# Patient Record
Sex: Male | Born: 1935 | Race: White | Hispanic: No | Marital: Married | Smoking: Former smoker
Health system: Southern US, Community
[De-identification: ages and names within clinical notes are randomized; demographics above are authoritative.]

## PROBLEM LIST (undated history)

## (undated) DIAGNOSIS — A0472 Enterocolitis due to Clostridium difficile, not specified as recurrent: Secondary | ICD-10-CM

## (undated) DIAGNOSIS — Z8619 Personal history of other infectious and parasitic diseases: Secondary | ICD-10-CM

## (undated) DIAGNOSIS — I6529 Occlusion and stenosis of unspecified carotid artery: Secondary | ICD-10-CM

## (undated) DIAGNOSIS — I219 Acute myocardial infarction, unspecified: Secondary | ICD-10-CM

## (undated) DIAGNOSIS — I714 Abdominal aortic aneurysm, without rupture, unspecified: Secondary | ICD-10-CM

## (undated) DIAGNOSIS — I4891 Unspecified atrial fibrillation: Secondary | ICD-10-CM

## (undated) DIAGNOSIS — I10 Essential (primary) hypertension: Secondary | ICD-10-CM

## (undated) DIAGNOSIS — E785 Hyperlipidemia, unspecified: Secondary | ICD-10-CM

## (undated) DIAGNOSIS — J449 Chronic obstructive pulmonary disease, unspecified: Secondary | ICD-10-CM

## (undated) DIAGNOSIS — I509 Heart failure, unspecified: Secondary | ICD-10-CM

## (undated) DIAGNOSIS — I350 Nonrheumatic aortic (valve) stenosis: Secondary | ICD-10-CM

## (undated) HISTORY — DX: Chronic obstructive pulmonary disease, unspecified: J44.9

## (undated) HISTORY — PX: HERNIA REPAIR: SHX51

## (undated) HISTORY — PX: VASCULAR SURGERY: SHX849

## (undated) HISTORY — DX: Enterocolitis due to Clostridium difficile, not specified as recurrent: A04.72

## (undated) HISTORY — DX: Personal history of other infectious and parasitic diseases: Z86.19

## (undated) HISTORY — DX: Hyperlipidemia, unspecified: E78.5

## (undated) HISTORY — DX: Heart failure, unspecified: I50.9

## (undated) HISTORY — DX: Acute myocardial infarction, unspecified: I21.9

## (undated) HISTORY — PX: CATARACT EXTRACTION: SUR2

---

## 1978-04-25 HISTORY — PX: OTHER SURGICAL HISTORY: SHX169

## 1991-04-26 HISTORY — PX: PROSTATE SURGERY: SHX751

## 1996-08-16 DIAGNOSIS — I714 Abdominal aortic aneurysm, without rupture, unspecified: Secondary | ICD-10-CM | POA: Insufficient documentation

## 2005-03-21 DIAGNOSIS — K649 Unspecified hemorrhoids: Secondary | ICD-10-CM | POA: Insufficient documentation

## 2005-04-25 HISTORY — PX: CAROTID ENDARTERECTOMY: SUR193

## 2005-09-16 ENCOUNTER — Ambulatory Visit: Payer: Self-pay | Admitting: General Surgery

## 2005-09-19 ENCOUNTER — Ambulatory Visit: Payer: Self-pay | Admitting: General Surgery

## 2005-10-27 ENCOUNTER — Ambulatory Visit: Payer: Self-pay | Admitting: Cardiology

## 2005-10-27 HISTORY — PX: CARDIAC CATHETERIZATION: SHX172

## 2006-02-15 ENCOUNTER — Inpatient Hospital Stay: Payer: Self-pay | Admitting: General Surgery

## 2007-04-26 HISTORY — PX: ABDOMINAL AORTIC ANEURYSM REPAIR: SHX42

## 2007-07-23 ENCOUNTER — Ambulatory Visit: Payer: Self-pay | Admitting: Vascular Surgery

## 2007-08-01 ENCOUNTER — Ambulatory Visit: Payer: Self-pay | Admitting: Vascular Surgery

## 2007-08-08 ENCOUNTER — Inpatient Hospital Stay: Payer: Self-pay | Admitting: Vascular Surgery

## 2008-02-12 ENCOUNTER — Ambulatory Visit: Payer: Self-pay | Admitting: Family Medicine

## 2009-04-25 HISTORY — PX: OTHER SURGICAL HISTORY: SHX169

## 2010-07-19 ENCOUNTER — Ambulatory Visit: Payer: Self-pay | Admitting: Vascular Surgery

## 2010-08-05 ENCOUNTER — Ambulatory Visit: Payer: Self-pay | Admitting: Vascular Surgery

## 2010-09-08 ENCOUNTER — Ambulatory Visit: Payer: Self-pay | Admitting: Vascular Surgery

## 2010-09-15 ENCOUNTER — Inpatient Hospital Stay: Payer: Self-pay | Admitting: Vascular Surgery

## 2010-09-15 HISTORY — PX: FEMORAL BYPASS: SHX50

## 2010-09-17 LAB — PATHOLOGY REPORT

## 2010-10-03 ENCOUNTER — Inpatient Hospital Stay: Payer: Self-pay | Admitting: Vascular Surgery

## 2010-12-21 ENCOUNTER — Encounter: Payer: Self-pay | Admitting: Vascular Surgery

## 2010-12-25 ENCOUNTER — Encounter: Payer: Self-pay | Admitting: Vascular Surgery

## 2011-01-12 ENCOUNTER — Ambulatory Visit: Payer: Self-pay | Admitting: Family Medicine

## 2011-06-08 LAB — COMPREHENSIVE METABOLIC PANEL
Albumin: 4 g/dL (ref 3.4–5.0)
Alkaline Phosphatase: 39 U/L — ABNORMAL LOW (ref 50–136)
Anion Gap: 10 (ref 7–16)
BUN: 9 mg/dL (ref 7–18)
Calcium, Total: 8.7 mg/dL (ref 8.5–10.1)
Chloride: 102 mmol/L (ref 98–107)
Co2: 27 mmol/L (ref 21–32)
SGOT(AST): 24 U/L (ref 15–37)
SGPT (ALT): 23 U/L

## 2011-06-08 LAB — CBC
MCV: 83 fL (ref 80–100)
RBC: 4.24 10*6/uL — ABNORMAL LOW (ref 4.40–5.90)
WBC: 13.7 10*3/uL — ABNORMAL HIGH (ref 3.8–10.6)

## 2011-06-08 LAB — TROPONIN I: Troponin-I: <0.02 ng/mL

## 2011-06-09 ENCOUNTER — Inpatient Hospital Stay: Payer: Self-pay | Admitting: Internal Medicine

## 2011-06-14 LAB — CULTURE, BLOOD (SINGLE)

## 2011-09-08 HISTORY — PX: DOPPLER ECHOCARDIOGRAPHY: SHX263

## 2012-05-29 LAB — HM COLONOSCOPY

## 2012-05-30 ENCOUNTER — Ambulatory Visit: Payer: Self-pay | Admitting: Gastroenterology

## 2012-09-26 ENCOUNTER — Ambulatory Visit: Payer: Self-pay | Admitting: General Surgery

## 2012-10-12 ENCOUNTER — Encounter: Payer: Self-pay | Admitting: General Surgery

## 2012-10-22 ENCOUNTER — Ambulatory Visit (INDEPENDENT_AMBULATORY_CARE_PROVIDER_SITE_OTHER): Payer: Medicare Other | Admitting: General Surgery

## 2012-10-22 ENCOUNTER — Encounter: Payer: Self-pay | Admitting: General Surgery

## 2012-10-22 VITALS — BP 110/70 | HR 76 | Resp 14 | Ht 67.0 in | Wt 166.0 lb

## 2012-10-22 DIAGNOSIS — K4090 Unilateral inguinal hernia, without obstruction or gangrene, not specified as recurrent: Secondary | ICD-10-CM

## 2012-10-22 NOTE — Patient Instructions (Addendum)
Patient's surgery has been scheduled for 10-29-12 at [facility]. It is okay for patient to continue 81 mg aspirin.

## 2012-10-22 NOTE — Progress Notes (Signed)
Patient ID: Calvin Carpenter, male   DOB: Aug 25, 1930, 79 y.o.   MRN: [id number]  Chief Complaint  Patient presents with  . Inguinal Hernia    HPI Calvin Carpenter is a 79 y.o. male referred here by PA Zara Chess for left inguinal hernia. The hernia is located around a  previous left incision groin incision where he had delayed healing of the fem-fem bypass.. Patient states only has pain in the area with coughing. Patient states he has some protrusion of the area at all times with increase in size at times.  The patient reported he became aware of the hernia only about 2 months ago.  HPI  Past Medical History  Diagnosis Date  . Allergy     seasonal  . Hypertension   . Hyperlipidemia     Past Surgical History  Procedure Laterality Date  . Groin surgery  2011  . Aneurisym  2009    stomach  . Corotid surgery Right 2008  . Vascular surgery Bilateral     Aortic aneurysm stent, right femoral-left femoral bypass    No family history on file.  Social History History  Substance Use Topics  . Smoking status: Former Smoker -- 1.00 packs/day for 65 years    Types: Cigarettes  . Smokeless tobacco: Never Used  . Alcohol Use: No    No Known Allergies  Current Outpatient Prescriptions  Medication Sig Dispense Refill  . amLODipine (NORVASC) 5 MG tablet Take 5 mg by mouth daily.      Marland Kitchen aspirin 81 MG tablet Take 81 mg by mouth daily.      . hydrochlorothiazide (HYDRODIURIL) 25 MG tablet Take 25 mg by mouth daily.      . metoprolol succinate (TOPROL-XL) 25 MG 24 hr tablet Take 25 mg by mouth daily.      . quinapril (ACCUPRIL) 20 MG tablet Take 20 mg by mouth daily.      . simvastatin (ZOCOR) 40 MG tablet Take 40 mg by mouth daily.      . verapamil (VERELAN PM) 240 MG 24 hr capsule Take 240 mg by mouth daily.       No current facility-administered medications for this visit.    Review of Systems Review of Systems  Constitutional: Negative.   Respiratory: Negative.   Cardiovascular:  Negative.   Gastrointestinal: Negative.     Blood pressure 110/70, pulse 76, resp. rate 14, height 5\' 7"  (1.702 m), weight 166 lb (75.297 kg).  Physical Exam Physical Exam  Constitutional: He is oriented to person, place, and time. He appears well-developed and well-nourished.  Eyes: Conjunctivae are normal. No scleral icterus.  Neck: Neck supple.  Cardiovascular: Normal rate and regular rhythm.   Murmur heard.  Systolic murmur is present with a grade of 2/6  Pulses:      Carotid pulses are on the right side with bruit.      Femoral pulses are 2+ on the right side, and 1+ on the left side. No palpable pedial pulses, no edema in the feet  Pulmonary/Chest: Effort normal and breath sounds normal.  Abdominal: Soft. There is no tenderness. A hernia is present. Hernia confirmed positive in the left inguinal area. Hernia confirmed negative in the right inguinal area.  Neurological: He is alert and oriented to person, place, and time.    Data Reviewed CT scans completed from 2009 forward reviewed. There's been a long-standing left inguinal hernia present. A fem-fem crossover graft is evident. Aortic stent evident.  Assessment  Symptomatic left inguinal hernia.    Plan    In light of the extensive skin changes in the left groin and history of a previous infection in this area as well as the fem-fem graft, laparoscopic repair would be ideal.  The risks of the procedure were reviewed in detail.  While the hernia has been long-standing, it is only now symptomatic.  The patient reports that the left lower extremity claudication he had experienced prior to the bypass has resolved, he is able to do his own activity daily living including shopping. No recent cardiac symptoms.    Patient's surgery has been scheduled for 10-29-12 at [facility]. It is okay for patient to continue 81 mg aspirin.   Calvin Carpenter 10/23/2012, 7:07 AM

## 2012-10-23 ENCOUNTER — Other Ambulatory Visit: Payer: Self-pay | Admitting: General Surgery

## 2012-10-23 ENCOUNTER — Ambulatory Visit: Payer: Self-pay | Admitting: General Surgery

## 2012-10-23 ENCOUNTER — Encounter: Payer: Self-pay | Admitting: General Surgery

## 2012-10-23 DIAGNOSIS — I10 Essential (primary) hypertension: Secondary | ICD-10-CM

## 2012-10-23 DIAGNOSIS — K4090 Unilateral inguinal hernia, without obstruction or gangrene, not specified as recurrent: Secondary | ICD-10-CM

## 2012-10-23 LAB — CBC WITH DIFFERENTIAL/PLATELET
Basophil %: 2.1 %
Eosinophil %: 11.6 %
HCT: 40.9 % (ref 40.0–52.0)
Lymphocyte %: 30.7 %
MCH: 32.6 pg (ref 26.0–34.0)
MCHC: 34.8 g/dL (ref 32.0–36.0)
MCV: 94 fL (ref 80–100)
Monocyte #: 0.9 x10 3/mm (ref 0.2–1.0)
Monocyte %: 8.8 %
Neutrophil #: 4.9 10*3/uL (ref 1.4–6.5)
Neutrophil %: 46.8 %
Platelet: 226 10*3/uL (ref 150–440)
RDW: 13.6 % (ref 11.5–14.5)
WBC: 10.5 10*3/uL (ref 3.8–10.6)

## 2012-10-23 LAB — BASIC METABOLIC PANEL
Anion Gap: 5 — ABNORMAL LOW (ref 7–16)
Calcium, Total: 9.3 mg/dL (ref 8.5–10.1)
Chloride: 102 mmol/L (ref 98–107)
Co2: 30 mmol/L (ref 21–32)
EGFR (African American): >60
Glucose: 102 mg/dL — ABNORMAL HIGH (ref 65–99)
Osmolality: 273 (ref 275–301)
Potassium: 3.8 mmol/L (ref 3.5–5.1)
Sodium: 137 mmol/L (ref 136–145)

## 2012-10-25 ENCOUNTER — Encounter: Payer: Self-pay | Admitting: General Surgery

## 2012-10-29 ENCOUNTER — Ambulatory Visit: Payer: Self-pay | Admitting: General Surgery

## 2012-10-29 DIAGNOSIS — K4090 Unilateral inguinal hernia, without obstruction or gangrene, not specified as recurrent: Secondary | ICD-10-CM

## 2012-10-30 ENCOUNTER — Encounter: Payer: Self-pay | Admitting: General Surgery

## 2012-11-07 ENCOUNTER — Encounter: Payer: Self-pay | Admitting: General Surgery

## 2012-11-07 ENCOUNTER — Ambulatory Visit (INDEPENDENT_AMBULATORY_CARE_PROVIDER_SITE_OTHER): Payer: Medicare Other | Admitting: General Surgery

## 2012-11-07 VITALS — BP 138/74 | HR 68 | Resp 12 | Ht 67.0 in | Wt 163.0 lb

## 2012-11-07 DIAGNOSIS — K4090 Unilateral inguinal hernia, without obstruction or gangrene, not specified as recurrent: Secondary | ICD-10-CM

## 2012-11-07 NOTE — Progress Notes (Signed)
Patient ID: Calvin Carpenter, male   DOB: 08/30/1930, 79 y.o.   MRN: [id number]  Chief Complaint  Patient presents with  . Routine Post Op    hernia    HPI Calvin Carpenter is a 79 y.o. male here today for his post op left inguinal hernia repair done on 10/29/12. Patient reports pain is mild and he is no longer taking any pain meds. Patient reports doing very well, and no problems to report. The patient reports no difficulty with bowel or bladder function. HPI  Past Medical History  Diagnosis Date  . Allergy     seasonal  . Hypertension   . Hyperlipidemia     Past Surgical History  Procedure Laterality Date  . Groin surgery  2011  . Aneurisym  2009    stomach  . Corotid surgery Right 2008  . Vascular surgery Bilateral     Aortic aneurysm stent, right femoral-left femoral bypass    No family history on file.  Social History History  Substance Use Topics  . Smoking status: Former Smoker -- 1.00 packs/day for 65 years    Types: Cigarettes  . Smokeless tobacco: Never Used  . Alcohol Use: No    No Known Allergies  Current Outpatient Prescriptions  Medication Sig Dispense Refill  . amLODipine (NORVASC) 5 MG tablet Take 5 mg by mouth daily.      Marland Kitchen aspirin 81 MG tablet Take 81 mg by mouth daily.      . hydrochlorothiazide (HYDRODIURIL) 25 MG tablet Take 25 mg by mouth daily.      . metoprolol succinate (TOPROL-XL) 25 MG 24 hr tablet Take 25 mg by mouth daily.      . quinapril (ACCUPRIL) 20 MG tablet Take 20 mg by mouth daily.      . simvastatin (ZOCOR) 40 MG tablet Take 40 mg by mouth daily.      . verapamil (VERELAN PM) 240 MG 24 hr capsule Take 240 mg by mouth daily.       No current facility-administered medications for this visit.    Review of Systems Review of Systems  Constitutional: Negative.   Respiratory: Negative.   Cardiovascular: Negative.     Blood pressure 138/74, pulse 68, resp. rate 12, height 5\' 7"  (1.702 m), weight 163 lb (73.936 kg).  Physical  Exam Physical Exam Examination of the abdominal port site showed good healing.  In the standing position no weaknesses appreciated in the left inguinal area as a notable preop. Data Reviewed No data review.  Assessment    Doing well status post laparoscopic repair of a left direct inguinal hernia.    Plan    Good judgment with activity was encouraged. He may resume golf as he is comfortable. We'll plan for a final check in 4 months.       Calvin Carpenter 11/07/2012, 9:36 PM

## 2012-11-13 ENCOUNTER — Encounter: Payer: Self-pay | Admitting: General Surgery

## 2012-12-18 ENCOUNTER — Ambulatory Visit: Payer: Self-pay | Admitting: Family Medicine

## 2013-03-13 ENCOUNTER — Ambulatory Visit (INDEPENDENT_AMBULATORY_CARE_PROVIDER_SITE_OTHER): Payer: Medicare Other | Admitting: General Surgery

## 2013-03-13 ENCOUNTER — Encounter: Payer: Self-pay | Admitting: General Surgery

## 2013-03-13 VITALS — BP 138/74 | HR 80 | Resp 16 | Ht 67.0 in | Wt 164.0 lb

## 2013-03-13 DIAGNOSIS — K4091 Unilateral inguinal hernia, without obstruction or gangrene, recurrent: Secondary | ICD-10-CM

## 2013-03-13 DIAGNOSIS — K4090 Unilateral inguinal hernia, without obstruction or gangrene, not specified as recurrent: Secondary | ICD-10-CM

## 2013-03-13 NOTE — Progress Notes (Signed)
Patient ID: Calvin Carpenter, male   DOB: Mar 05, 1931, 79 y.o.   MRN: [id number]  Chief Complaint  Patient presents with  . Hernia    HPI Calvin Carpenter is a 79 y.o. male. Here today for follow up from left laparoscopic inguinal hernia repair done on 10/29/12. Patient reports pain is mild that comes and goes, worse when "gassy". The patient reports no difficulty with bowel or bladder function.   HPI  Past Medical History  Diagnosis Date  . Allergy     seasonal  . Hypertension   . Hyperlipidemia     Past Surgical History  Procedure Laterality Date  . Groin surgery  2011  . Aneurisym  2009    stomach  . Corotid surgery Right 2008  . Vascular surgery Bilateral     Aortic aneurysm stent, right femoral-left femoral bypass  . Hernia repair Left 10-29-12    inguinal    No family history on file.  Social History History  Substance Use Topics  . Smoking status: Former Smoker -- 1.00 packs/day for 65 years    Types: Cigarettes  . Smokeless tobacco: Never Used  . Alcohol Use: No    No Known Allergies  Current Outpatient Prescriptions  Medication Sig Dispense Refill  . amLODipine (NORVASC) 5 MG tablet Take 5 mg by mouth daily.      Marland Kitchen aspirin 81 MG tablet Take 81 mg by mouth daily.      . Fluocinonide 0.1 % CREA       . fluticasone (FLONASE) 50 MCG/ACT nasal spray Place 1 spray into both nostrils as needed for allergies or rhinitis.      . hydrochlorothiazide (HYDRODIURIL) 25 MG tablet Take 25 mg by mouth daily.      . metoprolol succinate (TOPROL-XL) 25 MG 24 hr tablet Take 25 mg by mouth daily.      . minocycline (MINOCIN,DYNACIN) 100 MG capsule       . montelukast (SINGULAIR) 10 MG tablet as needed.       . quinapril (ACCUPRIL) 20 MG tablet Take 20 mg by mouth daily.      . simvastatin (ZOCOR) 40 MG tablet Take 40 mg by mouth daily.      . verapamil (VERELAN PM) 240 MG 24 hr capsule Take 240 mg by mouth daily.       No current facility-administered medications for this visit.     Review of Systems Review of Systems  Blood pressure 138/74, pulse 80, resp. rate 16, height 5\' 7"  (1.702 m), weight 164 lb (74.39 kg).  Physical Exam Physical Exam  Constitutional: He is oriented to person, place, and time. He appears well-developed and well-nourished.  Cardiovascular: Normal rate and regular rhythm.   Murmur heard. Pulmonary/Chest: Effort normal and breath sounds normal.  Abdominal: Soft. Normal appearance. A hernia is present. Hernia confirmed positive in the left inguinal area.  Neurological: He is alert and oriented to person, place, and time.  Skin: Skin is warm and dry.    Data Reviewed Operative notes from the October 29, 2012 exam were reviewed. A 2 cm direct defect was covered with a 10 x 15 cm mesh  Assessment    Recurrent left inguinal hernia.     Plan    The patient is not as uncomfortable as he was prior to his original repair, but there is certainly clinical evidence of recurrence. With his previous significant left groin infection, avoidance of an operative exploration from the anterior approach would be prudent,  but may be avoidable.  We'll plan for a followup exam in 3 months to see if the area is progressing in size. While this could represent a seroma, I think it is more likely a recurrent defect. The patient was encouraged to call promptly if he had discomfort in the groin or any other symptoms suggestive of obstruction.        [name] 03/13/2013, 9:47 PM

## 2013-03-13 NOTE — Patient Instructions (Addendum)
The patient is aware to call back for any questions or concerns.  Inguinal Hernia, Adult Muscles help keep everything in the body in its proper place. But if a weak spot in the muscles develops, something can poke through. That is called a hernia. When this happens in the lower part of the belly (abdomen), it is called an inguinal hernia. (It takes its name from a part of the body in this region called the inguinal canal.) A weak spot in the wall of muscles lets some fat or part of the small intestine bulge through. An inguinal hernia can develop at any age. Men get them more often than women. CAUSES  In adults, an inguinal hernia develops over time.  It can be triggered by:  Suddenly straining the muscles of the lower abdomen.  Lifting heavy objects.  Straining to have a bowel movement. Difficult bowel movements (constipation) can lead to this.  Constant coughing. This may be caused by smoking or lung disease.  Being overweight.  Being pregnant.  Working at a job that requires long periods of standing or heavy lifting.  Having had an inguinal hernia before. One type can be an emergency situation. It is called a strangulated inguinal hernia. It develops if part of the small intestine slips through the weak spot and cannot get back into the abdomen. The blood supply can be cut off. If that happens, part of the intestine may die. This situation requires emergency surgery. SYMPTOMS  Often, a small inguinal hernia has no symptoms. It is found when a healthcare provider does a physical exam. Larger hernias usually have symptoms.   In adults, symptoms may include:  A lump in the groin. This is easier to see when the person is standing. It might disappear when lying down.  In men, a lump in the scrotum.  Pain or burning in the groin. This occurs especially when lifting, straining or coughing.  A dull ache or feeling of pressure in the groin.  Signs of a strangulated hernia can  include:  A bulge in the groin that becomes very painful and tender to the touch.  A bulge that turns red or purple.  Fever, nausea and vomiting.  Inability to have a bowel movement or to pass gas. DIAGNOSIS  To decide if you have an inguinal hernia, a healthcare provider will probably do a physical examination.  This will include asking questions about any symptoms you have noticed.  The healthcare provider might feel the groin area and ask you to cough. If an inguinal hernia is felt, the healthcare provider may try to slide it back into the abdomen.  Usually no other tests are needed. TREATMENT  Treatments can vary. The size of the hernia makes a difference. Options include:  Watchful waiting. This is often suggested if the hernia is small and you have had no symptoms.  No medical procedure will be done unless symptoms develop.  You will need to watch closely for symptoms. If any occur, contact your healthcare provider right away.  Surgery. This is used if the hernia is larger or you have symptoms.  Open surgery. This is usually an outpatient procedure (you will not stay overnight in a hospital). An cut (incision) is made through the skin in the groin. The hernia is put back inside the abdomen. The weak area in the muscles is then repaired by herniorrhaphy or hernioplasty. Herniorrhaphy: in this type of surgery, the weak muscles are sewn back together. Hernioplasty: a patch or mesh   is used to close the weak area in the abdominal wall.  Laparoscopy. In this procedure, a surgeon makes small incisions. A thin tube with a tiny video camera (called a laparoscope) is put into the abdomen. The surgeon repairs the hernia with mesh by looking with the video camera and using two long instruments. HOME CARE INSTRUCTIONS   After surgery to repair an inguinal hernia:  You will need to take pain medicine prescribed by your healthcare provider. Follow all directions carefully.  You will need  to take care of the wound from the incision.  Your activity will be restricted for awhile. This will probably include no heavy lifting for several weeks. You also should not do anything too active for a few weeks. When you can return to work will depend on the type of job that you have.  During "watchful waiting" periods, you should:  Maintain a healthy weight.  Eat a diet high in fiber (fruits, vegetables and whole grains).  Drink plenty of fluids to avoid constipation. This means drinking enough water and other liquids to keep your urine clear or pale yellow.  Do not lift heavy objects.  Do not stand for long periods of time.  Quit smoking. This should keep you from developing a frequent cough. SEEK MEDICAL CARE IF:   A bulge develops in your groin area.  You feel pain, a burning sensation or pressure in the groin. This might be worse if you are lifting or straining.  You develop a fever of more than 100.5 F (38.1 C). SEEK IMMEDIATE MEDICAL CARE IF:   Pain in the groin increases suddenly.  A bulge in the groin gets bigger suddenly and does not go down.  For men, there is sudden pain in the scrotum. Or, the size of the scrotum increases.  A bulge in the groin area becomes red or purple and is painful to touch.  You have nausea or vomiting that does not go away.  You feel your heart beating much faster than normal.  You cannot have a bowel movement or pass gas.  You develop a fever of more than 102.0 F (38.9 C). Document Released: 08/28/2008 Document Revised: 07/04/2011 Document Reviewed: 08/28/2008 [employer] Patient Information 2014 [employer].   

## 2013-04-01 ENCOUNTER — Telehealth: Payer: Self-pay | Admitting: *Deleted

## 2013-04-01 NOTE — Telephone Encounter (Signed)
Message left for patient to call the office.  We need to arrange a CT scan and office visit. Follow up.

## 2013-04-01 NOTE — Telephone Encounter (Signed)
Patient has been scheduled for a CT pelvis with contrast at [facility] for 04-04-13 at 9 am (arrive 8:45 am). Prep: no solids 4 hours prior but patient may have clear liquids up until exam time, pick up prep kit, and take medication list. Patient verbalizes understanding.  Office visit follow has also been arranged.

## 2013-04-01 NOTE — Telephone Encounter (Signed)
Message copied by Nicholes Mango on Mon Apr 01, 2013 10:07 AM ------      Message from: [place], Merrily Pew      Created: Sun Mar 31, 2013 10:03 AM       Please notify the patient for that like him to get a CT scan of his pelvis to reassess the left hernia. This would be with oral and IV contrast. CT of the pelvis only. Appointment to follow. This can be at his convenience. Thank you ------

## 2013-04-04 ENCOUNTER — Ambulatory Visit: Payer: Self-pay | Admitting: General Surgery

## 2013-04-04 ENCOUNTER — Encounter: Payer: Self-pay | Admitting: General Surgery

## 2013-04-10 ENCOUNTER — Encounter: Payer: Self-pay | Admitting: General Surgery

## 2013-04-10 ENCOUNTER — Ambulatory Visit (INDEPENDENT_AMBULATORY_CARE_PROVIDER_SITE_OTHER): Payer: Medicare Other | Admitting: General Surgery

## 2013-04-10 VITALS — BP 122/66 | HR 76 | Resp 14 | Ht 67.0 in | Wt 162.0 lb

## 2013-04-10 DIAGNOSIS — K4090 Unilateral inguinal hernia, without obstruction or gangrene, not specified as recurrent: Secondary | ICD-10-CM

## 2013-04-10 DIAGNOSIS — K4091 Unilateral inguinal hernia, without obstruction or gangrene, recurrent: Secondary | ICD-10-CM

## 2013-04-10 NOTE — Patient Instructions (Addendum)

## 2013-04-10 NOTE — Progress Notes (Signed)
Patient ID: Calvin Carpenter, male   DOB: 03-12-1931, 79 y.o.   MRN: [id number]  Chief Complaint  Patient presents with  . Follow-up    ct    HPI Calvin Carpenter is a 79 y.o. male here today follow up from a ct done on 04/03/13.  HPI  Past Medical History  Diagnosis Date  . Allergy     seasonal  . Hypertension   . Hyperlipidemia     Past Surgical History  Procedure Laterality Date  . Groin surgery  2011  . Aneurisym  2009    stomach  . Corotid surgery Right 2008  . Vascular surgery Bilateral     Aortic aneurysm stent, right femoral-left femoral bypass  . Hernia repair Left 10-29-12    inguinal    No family history on file.  Social History History  Substance Use Topics  . Smoking status: Former Smoker -- 1.00 packs/day for 65 years    Types: Cigarettes  . Smokeless tobacco: Never Used  . Alcohol Use: No    No Known Allergies  Current Outpatient Prescriptions  Medication Sig Dispense Refill  . amLODipine (NORVASC) 5 MG tablet Take 5 mg by mouth daily.      Marland Kitchen aspirin 81 MG tablet Take 81 mg by mouth daily.      . Fluocinonide 0.1 % CREA       . fluticasone (FLONASE) 50 MCG/ACT nasal spray Place 1 spray into both nostrils as needed for allergies or rhinitis.      . hydrochlorothiazide (HYDRODIURIL) 25 MG tablet Take 25 mg by mouth daily.      . metoprolol succinate (TOPROL-XL) 25 MG 24 hr tablet Take 25 mg by mouth daily.      . minocycline (MINOCIN,DYNACIN) 100 MG capsule       . montelukast (SINGULAIR) 10 MG tablet as needed.       . quinapril (ACCUPRIL) 20 MG tablet Take 20 mg by mouth daily.      . simvastatin (ZOCOR) 40 MG tablet Take 40 mg by mouth daily.      . verapamil (VERELAN PM) 240 MG 24 hr capsule Take 240 mg by mouth daily.       No current facility-administered medications for this visit.    Review of Systems Review of Systems  Constitutional: Negative.   Respiratory: Negative.   Cardiovascular: Negative.     Blood pressure 122/66, pulse 76,  resp. rate 14, height 5\' 7"  (1.702 m), weight 162 lb (73.483 kg).  Physical Exam Physical Exam  Constitutional: He is oriented to person, place, and time. He appears well-developed and well-nourished.  Eyes: No scleral icterus.  Cardiovascular: Normal rate, regular rhythm and normal heart sounds.   Pulmonary/Chest: Breath sounds normal.  Abdominal: Soft. Normal appearance and bowel sounds are normal. There is no hepatomegaly. There is no tenderness. A hernia is present. Hernia confirmed positive in the left inguinal area. Hernia confirmed negative in the right inguinal area.  Lymphadenopathy:    He has no cervical adenopathy.  Neurological: He is alert and oriented to person, place, and time.  Skin: Skin is warm and dry.    Data Reviewed Recently completed a CT scan shows a recurrent left inguinal hernia unchanged from 2012. Sigmoid colon is present within the hernia. No evidence of obstruction.  Assessment    The patient is still symptomatic from the recurrent hernia and for that reason repeat repairs considered reasonable.    Plan    We'll plan for  a repeat laparoscopy and if adequate mesh fixation cannot be obtained we'll look to complete an anterior repair. The latter had been originally avoided due to previous groin infection, but as the patient is asymptomatic unless good mesh fixation can be obtained laparoscopically, and opened repair would be appropriate.    Patient's surgery has been scheduled for 04-23-13 at [facility]. It is okay for patient to continue 81 mg aspirin.    [name] 04/13/2013, 10:24 AM

## 2013-04-13 ENCOUNTER — Other Ambulatory Visit: Payer: Self-pay | Admitting: General Surgery

## 2013-04-13 DIAGNOSIS — K4091 Unilateral inguinal hernia, without obstruction or gangrene, recurrent: Secondary | ICD-10-CM

## 2013-04-16 ENCOUNTER — Ambulatory Visit: Payer: Self-pay | Admitting: General Surgery

## 2013-04-16 DIAGNOSIS — I10 Essential (primary) hypertension: Secondary | ICD-10-CM

## 2013-04-16 LAB — CBC WITH DIFFERENTIAL/PLATELET
Eosinophil %: 17.3 %
HGB: 13.1 g/dL (ref 13.0–18.0)
Lymphocyte #: 2.9 10*3/uL (ref 1.0–3.6)
Lymphocyte %: 24.5 %
MCH: 30 pg (ref 26.0–34.0)
MCHC: 31.4 g/dL — ABNORMAL LOW (ref 32.0–36.0)
MCV: 95 fL (ref 80–100)
Monocyte #: 1 x10 3/mm (ref 0.2–1.0)
Monocyte %: 8.2 %
Neutrophil #: 5.7 10*3/uL (ref 1.4–6.5)
Platelet: 192 10*3/uL (ref 150–440)
RBC: 4.38 10*6/uL — ABNORMAL LOW (ref 4.40–5.90)
RDW: 13.5 % (ref 11.5–14.5)
WBC: 11.7 10*3/uL — ABNORMAL HIGH (ref 3.8–10.6)

## 2013-04-16 LAB — LAB REPORT - SCANNED
Hgb: 13.1
MCH: 30 pg (ref 26.0–34.0)
Potassium: 3.9 mmol/L (ref 3.4–5.3)
RBC: 4.38 10*6/uL — AB (ref 4.50–5.90)
RDW: 13.5 % (ref 11.5–14.5)
WBC: 11.7 10^3/mL

## 2013-04-22 ENCOUNTER — Encounter: Payer: Self-pay | Admitting: General Surgery

## 2013-04-23 ENCOUNTER — Inpatient Hospital Stay: Payer: Self-pay | Admitting: General Surgery

## 2013-04-23 DIAGNOSIS — K4091 Unilateral inguinal hernia, without obstruction or gangrene, recurrent: Secondary | ICD-10-CM

## 2013-04-23 DIAGNOSIS — I219 Acute myocardial infarction, unspecified: Secondary | ICD-10-CM

## 2013-04-23 HISTORY — DX: Acute myocardial infarction, unspecified: I21.9

## 2013-04-23 LAB — HEMOGLOBIN
HGB: 9.6 g/dL — ABNORMAL LOW (ref 13.0–18.0)
HGB: 9.6 g/dL — ABNORMAL LOW (ref 13.0–18.0)

## 2013-04-24 LAB — BASIC METABOLIC PANEL
Anion Gap: 7 (ref 7–16)
Chloride: 99 mmol/L (ref 98–107)
Co2: 25 mmol/L (ref 21–32)
Creatinine: 1.27 mg/dL (ref 0.60–1.30)
EGFR (Non-African Amer.): 53 — ABNORMAL LOW
Glucose: 127 mg/dL — ABNORMAL HIGH (ref 65–99)
Osmolality: 267 (ref 275–301)

## 2013-04-24 LAB — HEMOGLOBIN: HGB: 8 g/dL — ABNORMAL LOW (ref 13.0–18.0)

## 2013-04-25 DIAGNOSIS — I499 Cardiac arrhythmia, unspecified: Secondary | ICD-10-CM

## 2013-04-25 LAB — CBC WITH DIFFERENTIAL/PLATELET
Basophil #: 0.1 10*3/uL (ref 0.0–0.1)
Basophil %: 0.3 %
Eosinophil #: 0 10*3/uL (ref 0.0–0.7)
Eosinophil %: 0.1 %
HCT: 26.4 % — ABNORMAL LOW (ref 40.0–52.0)
HGB: 9 g/dL — ABNORMAL LOW (ref 13.0–18.0)
Lymphocyte #: 3.2 10*3/uL (ref 1.0–3.6)
Lymphocyte %: 14.8 %
MCH: 31.4 pg (ref 26.0–34.0)
MCHC: 34.2 g/dL (ref 32.0–36.0)
MCV: 92 fL (ref 80–100)
Monocyte #: 2.2 x10 3/mm — ABNORMAL HIGH (ref 0.2–1.0)
Monocyte %: 10.1 %
Neutrophil #: 15.9 10*3/uL — ABNORMAL HIGH (ref 1.4–6.5)
Neutrophil %: 74.7 %
Platelet: 164 10*3/uL (ref 150–440)
RBC: 2.88 10*6/uL — ABNORMAL LOW (ref 4.40–5.90)
RDW: 15.1 % — ABNORMAL HIGH (ref 11.5–14.5)
WBC: 21.3 10*3/uL — ABNORMAL HIGH (ref 3.8–10.6)

## 2013-04-25 LAB — BASIC METABOLIC PANEL
Anion Gap: 4 — ABNORMAL LOW (ref 7–16)
BUN: 19 mg/dL — ABNORMAL HIGH (ref 7–18)
Calcium, Total: 8.5 mg/dL (ref 8.5–10.1)
Chloride: 95 mmol/L — ABNORMAL LOW (ref 98–107)
Co2: 30 mmol/L (ref 21–32)
Creatinine: 0.91 mg/dL (ref 0.60–1.30)
EGFR (African American): >60
EGFR (Non-African Amer.): >60
Glucose: 137 mg/dL — ABNORMAL HIGH (ref 65–99)
Osmolality: 263 (ref 275–301)
Potassium: 4 mmol/L (ref 3.5–5.1)
Sodium: 129 mmol/L — ABNORMAL LOW (ref 136–145)

## 2013-04-25 LAB — TROPONIN I
Troponin-I: 7 ng/mL — ABNORMAL HIGH
Troponin-I: 9.1 ng/mL — ABNORMAL HIGH
Troponin-I: 8.7 ng/mL — ABNORMAL HIGH

## 2013-04-25 LAB — CK-MB
CK-MB: 53.1 ng/mL — ABNORMAL HIGH (ref 0.5–3.6)
CK-MB: 53 ng/mL — AB (ref 0.5–3.6)
CK-MB: 43.4 ng/mL — ABNORMAL HIGH (ref 0.5–3.6)

## 2013-04-26 LAB — CBC WITH DIFFERENTIAL/PLATELET
BASOS PCT: 0.2 %
Basophil #: 0 10*3/uL (ref 0.0–0.1)
EOS PCT: 0 %
Eosinophil #: 0 10*3/uL (ref 0.0–0.7)
HCT: 26.4 % — ABNORMAL LOW (ref 40.0–52.0)
HGB: 9 g/dL — ABNORMAL LOW (ref 13.0–18.0)
LYMPHS ABS: 1.8 10*3/uL (ref 1.0–3.6)
Lymphocyte %: 9.4 %
MCH: 31.8 pg (ref 26.0–34.0)
MCHC: 34.1 g/dL (ref 32.0–36.0)
MCV: 93 fL (ref 80–100)
Monocyte #: 1 x10 3/mm (ref 0.2–1.0)
Monocyte %: 5.5 %
NEUTROS ABS: 15.9 10*3/uL — AB (ref 1.4–6.5)
Neutrophil %: 84.9 %
PLATELETS: 163 10*3/uL (ref 150–440)
RBC: 2.83 10*6/uL — AB (ref 4.40–5.90)
RDW: 15.2 % — ABNORMAL HIGH (ref 11.5–14.5)
WBC: 18.8 10*3/uL — AB (ref 3.8–10.6)

## 2013-04-26 LAB — BASIC METABOLIC PANEL
ANION GAP: 5 — AB (ref 7–16)
BUN: 18 mg/dL (ref 7–18)
CO2: 29 mmol/L (ref 21–32)
CREATININE: 0.8 mg/dL (ref 0.60–1.30)
Calcium, Total: 8.3 mg/dL — ABNORMAL LOW (ref 8.5–10.1)
Chloride: 98 mmol/L (ref 98–107)
EGFR (African American): >60
GLUCOSE: 167 mg/dL — AB (ref 65–99)
Osmolality: 270 (ref 275–301)
Potassium: 4.1 mmol/L (ref 3.5–5.1)
Sodium: 132 mmol/L — ABNORMAL LOW (ref 136–145)

## 2013-04-27 LAB — CBC WITH DIFFERENTIAL/PLATELET
BASOS PCT: 0.1 %
Basophil #: 0 10*3/uL (ref 0.0–0.1)
EOS ABS: 0 10*3/uL (ref 0.0–0.7)
Eosinophil %: 0 %
HCT: 26 % — ABNORMAL LOW (ref 40.0–52.0)
HGB: 8.7 g/dL — AB (ref 13.0–18.0)
LYMPHS ABS: 1.7 10*3/uL (ref 1.0–3.6)
Lymphocyte %: 9.2 %
MCH: 31.7 pg (ref 26.0–34.0)
MCHC: 33.5 g/dL (ref 32.0–36.0)
MCV: 95 fL (ref 80–100)
Monocyte #: 1.4 x10 3/mm — ABNORMAL HIGH (ref 0.2–1.0)
Monocyte %: 7.6 %
Neutrophil #: 15.4 10*3/uL — ABNORMAL HIGH (ref 1.4–6.5)
Neutrophil %: 83.1 %
PLATELETS: 202 10*3/uL (ref 150–440)
RBC: 2.75 10*6/uL — AB (ref 4.40–5.90)
RDW: 14.9 % — AB (ref 11.5–14.5)
WBC: 18.5 10*3/uL — AB (ref 3.8–10.6)

## 2013-04-27 LAB — BASIC METABOLIC PANEL
ANION GAP: 5 — AB (ref 7–16)
BUN: 18 mg/dL (ref 7–18)
CREATININE: 0.7 mg/dL (ref 0.60–1.30)
Calcium, Total: 8.5 mg/dL (ref 8.5–10.1)
Chloride: 101 mmol/L (ref 98–107)
Co2: 30 mmol/L (ref 21–32)
GLUCOSE: 175 mg/dL — AB (ref 65–99)
Osmolality: 278 (ref 275–301)
Potassium: 3.8 mmol/L (ref 3.5–5.1)
Sodium: 136 mmol/L (ref 136–145)

## 2013-04-27 LAB — MAGNESIUM: MAGNESIUM: 2.4 mg/dL

## 2013-04-27 LAB — VANCOMYCIN, TROUGH: VANCOMYCIN, TROUGH: 4 ug/mL — AB (ref 10–20)

## 2013-04-28 LAB — CBC WITH DIFFERENTIAL/PLATELET
BANDS NEUTROPHIL: 3 %
COMMENT - H1-COM3: NORMAL
HCT: 25.9 % — AB (ref 40.0–52.0)
HGB: 8.6 g/dL — ABNORMAL LOW (ref 13.0–18.0)
Lymphocytes: 5 %
MCH: 31.5 pg (ref 26.0–34.0)
MCHC: 33.3 g/dL (ref 32.0–36.0)
MCV: 95 fL (ref 80–100)
Monocytes: 4 %
NRBC/100 WBC: 8 /
Platelet: 216 10*3/uL (ref 150–440)
RBC: 2.73 10*6/uL — ABNORMAL LOW (ref 4.40–5.90)
RDW: 15.4 % — ABNORMAL HIGH (ref 11.5–14.5)
Segmented Neutrophils: 88 %
WBC: 19 10*3/uL — AB (ref 3.8–10.6)

## 2013-04-28 LAB — BASIC METABOLIC PANEL
Anion Gap: 5 — ABNORMAL LOW (ref 7–16)
BUN: 19 mg/dL — ABNORMAL HIGH (ref 7–18)
Calcium, Total: 8.6 mg/dL (ref 8.5–10.1)
Chloride: 104 mmol/L (ref 98–107)
Co2: 30 mmol/L (ref 21–32)
Creatinine: 0.73 mg/dL (ref 0.60–1.30)
EGFR (Non-African Amer.): >60
Glucose: 173 mg/dL — ABNORMAL HIGH (ref 65–99)
Osmolality: 284 (ref 275–301)
POTASSIUM: 3.8 mmol/L (ref 3.5–5.1)
SODIUM: 139 mmol/L (ref 136–145)

## 2013-04-28 LAB — VANCOMYCIN, TROUGH: Vancomycin, Trough: 6 ug/mL — ABNORMAL LOW (ref 10–20)

## 2013-04-29 ENCOUNTER — Encounter: Payer: Self-pay | Admitting: General Surgery

## 2013-04-29 LAB — CBC WITH DIFFERENTIAL/PLATELET
BANDS NEUTROPHIL: 4 %
COMMENT - H1-COM4: NORMAL
HCT: 29.5 % — AB (ref 40.0–52.0)
HGB: 9.8 g/dL — ABNORMAL LOW (ref 13.0–18.0)
Lymphocytes: 11 %
MCH: 32.1 pg (ref 26.0–34.0)
MCHC: 33.3 g/dL (ref 32.0–36.0)
MCV: 96 fL (ref 80–100)
Monocytes: 4 %
Myelocyte: 1 %
NRBC/100 WBC: 7 /
Platelet: 269 10*3/uL (ref 150–440)
RBC: 3.06 10*6/uL — AB (ref 4.40–5.90)
RDW: 15.3 % — ABNORMAL HIGH (ref 11.5–14.5)
SEGMENTED NEUTROPHILS: 79 %
Variant Lymphocyte - H1-Rlymph: 1 %
WBC: 19.3 10*3/uL — ABNORMAL HIGH (ref 3.8–10.6)

## 2013-04-29 LAB — BASIC METABOLIC PANEL
Anion Gap: 7 (ref 7–16)
BUN: 19 mg/dL — AB (ref 7–18)
Calcium, Total: 8.5 mg/dL (ref 8.5–10.1)
Chloride: 99 mmol/L (ref 98–107)
Co2: 31 mmol/L (ref 21–32)
Creatinine: 0.79 mg/dL (ref 0.60–1.30)
EGFR (African American): >60
GLUCOSE: 176 mg/dL — AB (ref 65–99)
Osmolality: 280 (ref 275–301)
Potassium: 3.4 mmol/L — ABNORMAL LOW (ref 3.5–5.1)
Sodium: 137 mmol/L (ref 136–145)

## 2013-04-29 LAB — OCCULT BLOOD X 1 CARD TO LAB, STOOL: OCCULT BLOOD, FECES: POSITIVE

## 2013-04-30 LAB — CBC WITH DIFFERENTIAL/PLATELET
Bands: 2 %
HCT: 31.5 % — AB (ref 40.0–52.0)
HGB: 10.2 g/dL — ABNORMAL LOW (ref 13.0–18.0)
LYMPHS PCT: 7 %
MCH: 31.2 pg (ref 26.0–34.0)
MCHC: 32.5 g/dL (ref 32.0–36.0)
MCV: 96 fL (ref 80–100)
METAMYELOCYTE: 1 %
MONOS PCT: 7 %
NRBC/100 WBC: 3 /
PLATELETS: 285 10*3/uL (ref 150–440)
RBC: 3.28 10*6/uL — ABNORMAL LOW (ref 4.40–5.90)
RDW: 15.6 % — ABNORMAL HIGH (ref 11.5–14.5)
Segmented Neutrophils: 83 %
WBC: 18.6 10*3/uL — ABNORMAL HIGH (ref 3.8–10.6)

## 2013-04-30 LAB — BASIC METABOLIC PANEL
Anion Gap: 5 — ABNORMAL LOW (ref 7–16)
BUN: 22 mg/dL — ABNORMAL HIGH (ref 7–18)
CHLORIDE: 99 mmol/L (ref 98–107)
CO2: 33 mmol/L — AB (ref 21–32)
Calcium, Total: 8.6 mg/dL (ref 8.5–10.1)
Creatinine: 0.83 mg/dL (ref 0.60–1.30)
EGFR (Non-African Amer.): >60
Glucose: 164 mg/dL — ABNORMAL HIGH (ref 65–99)
Osmolality: 281 (ref 275–301)
Potassium: 3.6 mmol/L (ref 3.5–5.1)
Sodium: 137 mmol/L (ref 136–145)

## 2013-05-01 ENCOUNTER — Encounter: Payer: Self-pay | Admitting: General Surgery

## 2013-05-01 LAB — CULTURE, BLOOD (SINGLE)

## 2013-05-02 ENCOUNTER — Telehealth: Payer: Self-pay | Admitting: *Deleted

## 2013-05-02 NOTE — Telephone Encounter (Signed)
Mr Calvin Carpenter states he is "doing good" this morning.  He feels a little weak but no nausea or vomiting. He plans on taking a tylenol for the little bit of pain he is having. He said to tell Dr Lemar LivingsByrnett he "did a good job and thanks". Home Health has not called him as of yet this morning.

## 2013-05-15 ENCOUNTER — Encounter: Payer: Self-pay | Admitting: General Surgery

## 2013-05-15 ENCOUNTER — Ambulatory Visit (INDEPENDENT_AMBULATORY_CARE_PROVIDER_SITE_OTHER): Payer: Medicare HMO | Admitting: General Surgery

## 2013-05-15 VITALS — BP 110/50 | HR 88 | Resp 14 | Ht 67.0 in | Wt 155.0 lb

## 2013-05-15 DIAGNOSIS — D649 Anemia, unspecified: Secondary | ICD-10-CM

## 2013-05-15 DIAGNOSIS — K4091 Unilateral inguinal hernia, without obstruction or gangrene, recurrent: Secondary | ICD-10-CM

## 2013-05-15 NOTE — Progress Notes (Signed)
Patient ID: Calvin Carpenter, male   DOB: [birth year], 80 y.o.   MRN: [id number]  Chief Complaint  Patient presents with  . Routine Post Op    left inguinal hernia repair    HPI Calvin CoderOtis R Mackley is a 80 y.o. male.  Here today for postoperative visit from laparoscopy with removal of previously placed Physiomesh  and open left recurrent inguinal hernia repair done 04-23-13.  He did develop hypotension, likely secondary to anemia from port site bleeding, CHF and pneumonia which has all now resolved. Followed by Dr. Lady GaryFath, appointment is for April, Dr Sherrie MustacheFisher appointment has not been scheduled as of yet.  The patient reports she's been having somewhat loose stools. He just completed his oral antibiotics prescribed for the pneumonia identified at the time of his hospitalization. This is likely the source of loose stools. No blood is reported. HPI  Past Medical History  Diagnosis Date  . Allergy     seasonal  . Hypertension   . Hyperlipidemia   . Myocardial infarct 04-23-13    Past Surgical History  Procedure Laterality Date  . Groin surgery  2011  . Aneurisym  2009    stomach  . Corotid surgery Right 2008  . Vascular surgery Bilateral     Aortic aneurysm stent, right femoral-left femoral bypass  . Hernia repair Left 10-29-12    inguinal  . Hernia repair Left 04-23-13    inguinal    No family history on file.  Social History History  Substance Use Topics  . Smoking status: Former Smoker -- 1.00 packs/day for 65 years    Types: Cigarettes  . Smokeless tobacco: Never Used  . Alcohol Use: No    No Known Allergies  Current Outpatient Prescriptions  Medication Sig Dispense Refill  . amLODipine (NORVASC) 5 MG tablet Take 5 mg by mouth daily.      Marland Kitchen. aspirin 81 MG tablet Take 81 mg by mouth daily.      . Fluocinonide 0.1 % CREA       . fluticasone (FLONASE) 50 MCG/ACT nasal spray Place 1 spray into both nostrils as needed for allergies or rhinitis.      . furosemide (LASIX) 20 MG  tablet       . metoprolol succinate (TOPROL-XL) 25 MG 24 hr tablet Take 25 mg by mouth daily.      . minocycline (MINOCIN,DYNACIN) 100 MG capsule       . montelukast (SINGULAIR) 10 MG tablet as needed.       . quinapril (ACCUPRIL) 20 MG tablet Take 20 mg by mouth daily.      . simvastatin (ZOCOR) 40 MG tablet Take 40 mg by mouth daily.      . verapamil (VERELAN PM) 240 MG 24 hr capsule Take 240 mg by mouth daily.       No current facility-administered medications for this visit.    Review of Systems Review of Systems  Constitutional: Negative.   Respiratory: Negative.   Cardiovascular: Negative.     Blood pressure 110/50, pulse 88, resp. rate 14, height 5\' 7"  (1.702 m), weight 155 lb (70.308 kg).  Physical Exam Physical Exam  Constitutional: He is oriented to person, place, and time. He appears well-developed and well-nourished.  Abdominal:    Neurological: He is alert and oriented to person, place, and time.  Skin: Skin is warm and dry.    Data Reviewed Patient's lowest hemoglobin recorded in the hospital was 8.0. Improved 10.2 at discharge the  Assessment  Doing well status post recurrent inguinal hernia and removal of previously placed intra-abdominal prosthetic mesh. Resolving abdominal wall ecchymosis.    Plan    The patient was encouraged to make use of probiotics to help resume normal stools more quickly. A CBC will be obtained to followup his progress in regards to anemia. Chest x-ray was clear that time of discharge.  The patient did request refill for his narcotic prescription. A prescription for Norco 5/325, #20 with the inscription one by mouth every 4 hours when necessary for pain with no refills was provided.  We will plan for a followup exam in one month.       [name] 05/16/2013, 4:32 PM

## 2013-05-15 NOTE — Patient Instructions (Signed)
probiotics

## 2013-05-16 ENCOUNTER — Telehealth: Payer: Self-pay | Admitting: *Deleted

## 2013-05-16 DIAGNOSIS — D649 Anemia, unspecified: Secondary | ICD-10-CM | POA: Insufficient documentation

## 2013-05-16 LAB — CBC WITH DIFFERENTIAL
BASOS: 0 %
Basophils Absolute: 0 10*3/uL (ref 0.0–0.2)
EOS ABS: 0.5 10*3/uL — AB (ref 0.0–0.4)
EOS: 5 %
HEMATOCRIT: 32.8 % — AB (ref 37.5–51.0)
Hemoglobin: 10.9 g/dL — ABNORMAL LOW (ref 12.6–17.7)
IMMATURE GRANS (ABS): 0 10*3/uL (ref 0.0–0.1)
Immature Granulocytes: 0 %
LYMPHS: 20 %
Lymphocytes Absolute: 2 10*3/uL (ref 0.7–3.1)
MCH: 32.3 pg (ref 26.6–33.0)
MCHC: 33.2 g/dL (ref 31.5–35.7)
MCV: 97 fL (ref 79–97)
Monocytes Absolute: 1.1 10*3/uL — ABNORMAL HIGH (ref 0.1–0.9)
Monocytes: 11 %
NEUTROS ABS: 6.3 10*3/uL (ref 1.4–7.0)
Neutrophils Relative %: 64 %
Platelets: 225 10*3/uL (ref 150–379)
RBC: 3.37 x10E6/uL — AB (ref 4.14–5.80)
RDW: 17 % — ABNORMAL HIGH (ref 12.3–15.4)
WBC: 9.9 10*3/uL (ref 3.4–10.8)

## 2013-05-16 NOTE — Telephone Encounter (Signed)
Message copied by Currie ParisHATCH, Keston Seever M on Thu May 16, 2013  4:42 PM ------      Message from: NeopitBYRNETT, Merrily PewJEFFREY W      Created: Thu May 16, 2013  2:36 PM       Please notify the patient his HGB is improving. F/U as planned.       ----- Message -----         From: [facility] Lab Results In Interface         Sent: 05/16/2013   5:47 AM           To: Earline MayotteJeffrey W Byrnett, MD                   ------

## 2013-05-16 NOTE — Telephone Encounter (Signed)
Notified patient as instructed, patient pleased. Discussed follow-up appointments, patient agrees  

## 2013-05-21 LAB — CBC AND DIFFERENTIAL
HCT: 35 % — AB (ref 41–53)
Hemoglobin: 11.3 g/dL — AB (ref 13.5–17.5)
Platelets: 559 10*3/uL — AB (ref 150–399)
WBC: 18.2 10^3/mL

## 2013-05-21 LAB — HEPATIC FUNCTION PANEL: AST: 33 U/L (ref 14–40)

## 2013-06-05 ENCOUNTER — Ambulatory Visit: Payer: Self-pay | Admitting: General Surgery

## 2013-06-18 ENCOUNTER — Encounter: Payer: Self-pay | Admitting: General Surgery

## 2013-06-18 ENCOUNTER — Ambulatory Visit (INDEPENDENT_AMBULATORY_CARE_PROVIDER_SITE_OTHER): Payer: Medicare HMO | Admitting: General Surgery

## 2013-06-18 VITALS — BP 120/60 | HR 80 | Resp 12 | Ht 67.0 in | Wt 149.0 lb

## 2013-06-18 DIAGNOSIS — K4091 Unilateral inguinal hernia, without obstruction or gangrene, recurrent: Secondary | ICD-10-CM

## 2013-06-18 NOTE — Progress Notes (Signed)
Patient ID: Calvin Carpenter, male   DOB: 1930-08-14, 80 y.o.   MRN: [id number]  Chief Complaint  Patient presents with  . Routine Post Op    HPI Calvin Carpenter is a 80 y.o. male here today for his post op left inguinal hernia repair done on 04/23/14. Patient states he is doing well.  HPI  Past Medical History  Diagnosis Date  . Allergy     seasonal  . Hypertension   . Hyperlipidemia   . Myocardial infarct 04-23-13    Past Surgical History  Procedure Laterality Date  . Groin surgery  2011  . Aneurisym  2009    stomach  . Corotid surgery Right 2008  . Vascular surgery Bilateral     Aortic aneurysm stent, right femoral-left femoral bypass  . Hernia repair Left 10-29-12    inguinal  . Hernia repair Left 04-23-13    inguinal    No family history on file.  Social History History  Substance Use Topics  . Smoking status: Former Smoker -- 1.00 packs/day for 65 years    Types: Cigarettes  . Smokeless tobacco: Never Used  . Alcohol Use: No    No Known Allergies  Current Outpatient Prescriptions  Medication Sig Dispense Refill  . amLODipine (NORVASC) 5 MG tablet Take 5 mg by mouth daily.      Marland Kitchen. aspirin 81 MG tablet Take 81 mg by mouth daily.      . Fluocinonide 0.1 % CREA       . fluticasone (FLONASE) 50 MCG/ACT nasal spray Place 1 spray into both nostrils as needed for allergies or rhinitis.      . furosemide (LASIX) 20 MG tablet       . hydrochlorothiazide (HYDRODIURIL) 25 MG tablet Take 1 tablet by mouth daily.      . metoprolol succinate (TOPROL-XL) 25 MG 24 hr tablet Take 25 mg by mouth daily.      . metroNIDAZOLE (FLAGYL) 500 MG tablet Take 1 tablet by mouth daily.      . minocycline (MINOCIN,DYNACIN) 100 MG capsule       . montelukast (SINGULAIR) 10 MG tablet as needed.       . quinapril (ACCUPRIL) 20 MG tablet Take 20 mg by mouth daily.      . simvastatin (ZOCOR) 40 MG tablet Take 40 mg by mouth daily.      . verapamil (VERELAN PM) 240 MG 24 hr capsule Take 240 mg  by mouth daily.       No current facility-administered medications for this visit.    Review of Systems Review of Systems  Constitutional: Negative.   Respiratory: Negative.   Cardiovascular: Negative.     Blood pressure 120/60, pulse 80, resp. rate 12, height 5\' 7"  (1.702 m), weight 149 lb (67.586 kg).  Physical Exam Physical Exam  Constitutional: He is oriented to person, place, and time. He appears well-developed and well-nourished.  Abdominal:  Left inguinal hernia repair well healed.   Neurological: He is alert and oriented to person, place, and time.  Skin: Skin is warm and dry.      Assessment    Doing well status post open left inguinal hernia repair.    Plan    The patient was cautioned used to judge no strenuous activity. Follow up will be on an as-needed basis.       Earline MayotteByrnett, Hooper Petteway W 06/18/2013, 4:38 PM

## 2013-06-18 NOTE — Patient Instructions (Addendum)
Patient to return as needed. Proper lifting techniques reviewed. 

## 2013-11-01 LAB — BASIC METABOLIC PANEL
BUN: 10 mg/dL (ref 4–21)
Creatinine: 1 mg/dL (ref 0.6–1.3)
Glucose: 99 mg/dL
Potassium: 4.3 mmol/L (ref 3.4–5.3)
SODIUM: 140 mmol/L (ref 137–147)

## 2013-11-01 LAB — LIPID PANEL
CHOLESTEROL: 160 mg/dL (ref 0–200)
HDL: 43 mg/dL (ref 35–70)
LDL Cholesterol: 61 mg/dL
Triglycerides: 280 mg/dL — AB (ref 40–160)

## 2013-11-01 LAB — HEPATIC FUNCTION PANEL: ALT: 31 U/L (ref 10–40)

## 2013-12-05 ENCOUNTER — Ambulatory Visit: Payer: Self-pay | Admitting: Family Medicine

## 2014-03-18 DIAGNOSIS — I739 Peripheral vascular disease, unspecified: Secondary | ICD-10-CM | POA: Insufficient documentation

## 2014-05-22 ENCOUNTER — Ambulatory Visit: Payer: Self-pay | Admitting: Family Medicine

## 2014-08-04 LAB — BASIC METABOLIC PANEL
ANION GAP: 6 — AB (ref 7–16)
BUN: 17 mg/dL
CO2: 24 mmol/L
Calcium, Total: 8.5 mg/dL — ABNORMAL LOW
Chloride: 108 mmol/L
Creatinine: 0.94 mg/dL
EGFR (Non-African Amer.): >60
Glucose: 225 mg/dL — ABNORMAL HIGH
Potassium: 3.6 mmol/L
Sodium: 138 mmol/L

## 2014-08-04 LAB — CBC
HCT: 32.8 % — ABNORMAL LOW (ref 40.0–52.0)
HGB: 10.5 g/dL — ABNORMAL LOW (ref 13.0–18.0)
MCH: 31.6 pg (ref 26.0–34.0)
MCHC: 32.1 g/dL (ref 32.0–36.0)
MCV: 98 fL (ref 80–100)
Platelet: 198 10*3/uL (ref 150–440)
RBC: 3.34 10*6/uL — ABNORMAL LOW (ref 4.40–5.90)
RDW: 14.3 % (ref 11.5–14.5)
WBC: 16.4 10*3/uL — AB (ref 3.8–10.6)

## 2014-08-04 LAB — PRO B NATRIURETIC PEPTIDE: B-Type Natriuretic Peptide: 442 pg/mL — ABNORMAL HIGH

## 2014-08-04 LAB — TROPONIN I: TROPONIN-I: 0.04 ng/mL — AB

## 2014-08-05 ENCOUNTER — Inpatient Hospital Stay
Admit: 2014-08-05 | Disposition: A | Discharge status: Home / Self Care | Payer: Self-pay | Attending: Internal Medicine | Admitting: Internal Medicine

## 2014-08-05 LAB — CK-MB
CK-MB: 4.5 ng/mL
CK-MB: 4.1 ng/mL
CK-MB: 9.1 ng/mL — ABNORMAL HIGH

## 2014-08-05 LAB — TSH: Thyroid Stimulating Horm: 2.019 u[IU]/mL

## 2014-08-05 LAB — TROPONIN I
TROPONIN-I: 0.15 ng/mL — AB
TROPONIN-I: 0.05 ng/mL — AB

## 2014-08-06 LAB — CBC WITH DIFFERENTIAL/PLATELET
BASOS PCT: 0.2 %
Basophil #: 0 10*3/uL (ref 0.0–0.1)
EOS ABS: 0 10*3/uL (ref 0.0–0.7)
Eosinophil %: 0 %
HCT: 33.6 % — ABNORMAL LOW (ref 40.0–52.0)
HGB: 11 g/dL — AB (ref 13.0–18.0)
Lymphocyte #: 1.8 10*3/uL (ref 1.0–3.6)
Lymphocyte %: 11.2 %
MCH: 32 pg (ref 26.0–34.0)
MCHC: 32.8 g/dL (ref 32.0–36.0)
MCV: 97 fL (ref 80–100)
MONO ABS: 1.6 x10 3/mm — AB (ref 0.2–1.0)
Monocyte %: 9.8 %
Neutrophil #: 12.9 10*3/uL — ABNORMAL HIGH (ref 1.4–6.5)
Neutrophil %: 78.8 %
PLATELETS: 220 10*3/uL (ref 150–440)
RBC: 3.45 10*6/uL — ABNORMAL LOW (ref 4.40–5.90)
RDW: 14.4 % (ref 11.5–14.5)
WBC: 16.4 10*3/uL — ABNORMAL HIGH (ref 3.8–10.6)

## 2014-08-06 LAB — BASIC METABOLIC PANEL
ANION GAP: 6 — AB (ref 7–16)
BUN: 20 mg/dL
CALCIUM: 8.6 mg/dL — AB
Chloride: 105 mmol/L
Co2: 29 mmol/L
Creatinine: 0.75 mg/dL
Glucose: 145 mg/dL — ABNORMAL HIGH
Potassium: 4.2 mmol/L
Sodium: 140 mmol/L

## 2014-08-15 NOTE — Op Note (Signed)
PATIENT NAME:  Calvin Carpenter, Calvin Carpenter MR#:  [id number] DATE OF BIRTH:  1931-03-08  DATE OF PROCEDURE:  10/29/2012  PREOPERATIVE DIAGNOSIS:  Symptomatic left inguinal hernia.   POSTOPERATIVE DIAGNOSIS:  Symptomatic left inguinal hernia.  OPERATIVE PROCEDURE:  Laparoscopic inguinal hernia repair.   SURGEON:  Donnalee CurryJeffrey Byrnett, M.D.   ANESTHESIA:  General endotracheal under Dr. Ronalee BeltsBhandari.   ESTIMATED BLOOD LOSS:  Minimal.   CLINICAL NOTE:  This 81 year old male has developed a symptomatic left direct inguinal hernia.  As he has had a previous right femoral to left femoral bypass complicated by a wound infection in the left groin, he was felt to be a candidate for a laparoscopic repair.   OPERATIVE NOTE:  With the patient under adequate general endotracheal anesthesia and hair removed with clippers, a Veress needle was placed through a slightly supraumbilical incision.  After assuring intra-abdominal location with the hanging drop test, the abdomen was insufflated with CO2 at 10 mmHg pressure.  A 30 degree scope was inserted and the 2 cm defect at the left direct hernia site was identified.  Colon was within this area.  Two 5-mm ports were placed on either side of the abdomen.  These ports were placed under direct vision.  Of note, the patient did receive Kefzol prior to the procedure.  With gentle traction, the colon was removed from the hernia site.  Adhesions were taken down to expose Cooper's ligament.  The vas and vessels were exposed.  Ultrasound had been used to trace the course of the femoral-femoral bypass graft on the abdomen to minimize the risk of graft injury.  A 10 x 15 cm piece of physio-mesh was brought into the field and placed over the defect.  This was then tacked with secure strap tacks to the Cooper's ligament and around the periphery of the mesh from the 9 o'clock to 6 o'clock position.  No attempt was made to dissect the peritoneum due to the wide defect at the direct hernia site and  exposure of the vas and vessels.  The abdomen was then gently desufflated and the mesh found to be in appropriate position.  The ports were removed under direct vision.  The fascia at the umbilicus was closed with an 0 Vicryl figure-of-eight suture.  Skin incisions were closed with 4-0 Vicryl subcuticular sutures.  Benzoin, Steri-Strips, Telfa and Tegaderm dressings were applied.    The patient tolerated the procedure well and was taken to the recovery room in stable condition.     ____________________________ Earline MayotteJeffrey W. Byrnett, MD jwb:ea D: 10/29/2012 21:09:55 ET T: 10/29/2012 23:45:02 ET JOB#: [id number]  cc: Earline MayotteJeffrey W. Byrnett, MD, <Dictator> Demetrios Isaacsonald E. Sherrie MustacheFisher, MD Earline MayotteJeffrey W. Byrnett, MD JEFFREY Brion AlimentW BYRNETT MD ELECTRONICALLY SIGNED 10/30/2012 21:04

## 2014-08-16 NOTE — Discharge Summary (Signed)
PATIENT NAME:  Calvin Carpenter, Calvin Carpenter MR#:  [id number] DATE OF BIRTH:  1930-05-25  DATE OF ADMISSION:  04/23/2013 DATE OF DISCHARGE:  05/01/2013  DISCHARGE DIAGNOSES: 1.  Recurrent left inguinal hernia.  2.  Postoperative respiratory failure.  3.  Postop anemia secondary to hemorrhage.    4.  Acidosis.   CLINICAL NOTE: This 81 year old male had developed a recurrent left inguinal hernia status post laparoscopic repair 6 months earlier. He was becoming increasingly symptomatic and elective repair was planned.   The patient was taken to the operating room on 04/23/2013, at which time laparoscopy showed that the previously placed Physiomesh had pulled away from its medial anchor points. The mesh was removed, and an open inguinal hernia repair, making use of a medium Ultrapro mesh, was completed. The patient was noted to have several episodes of hypotension during the procedure; prior to release of the pneumoperitoneum and in the recovery room. He was treated with ephedrine with minimal results. He subsequently received 3 liters of fluid with improvement of his pressure, but worsening respiratory distress. Post fluid bolus, hemoglobin fell by 3 grams/dL, and it was unclear whether there was actual bleeding or this was all dilutional. The patient was subsequently seen in consultation by the hospitalist service and details of their evaluation can be identified in the chart.   The patient was scanned and was found to have evidence of intra-abdominal blood, although no extravasation was appreciated. All mesenteric vessels were patent, and the patient received transfusion without ill effect. The hemoglobin stabilized over the next 48 hours, and he was subsequently able to be transferred to the floor. He was evaluated by cardiology, who did not find evidence of significant cardiovascular disease. The patient was identified with pneumonia, and he was placed on vancomycin and Zosyn. While he was reported to have developed  new onset atrial fibrillation, on subsequent evaluation by Mariel KanskyKen Fath from cardiology, this was not felt to be the case.   The patient showed continued improvement in his oxygen saturation, and while he had had 1 episode of black stool, heme-positive, his stools had cleared by the time of discharge. The consideration was for possible gastritis as a source. He was treated with Protonix.   By the time of discharge, his hemoglobin had continued to rise without additional transfusion, his renal function had returned to baseline, and while his white blood cell count was 18,000, he was clinically improved and felt to be a candidate for discharge home. Chest x-ray, completed on 04/28/2013, did show evidence of bibasilar pneumonia.   At the time of discharge, the patient was taking a regular diet well, ambulating without difficulty and showed no evidence of postural hypotension.   SUMMARY OF RADIOGRAPHIC STUDIES: Are as follows: CT of the chest was negative for pulmonary embolus. Small bilateral pleural effusions were noted. Bilateral lower lobe pneumonia with parapneumonic effusions were appreciated. CT scan of the abdomen, completed December 31, showed evidence of hemoperitoneum without extravasation. Patent mesenteric vessels. Chest x-ray, completed 04/30/2013, showed airspace disease previously identified had resolved. Emphysematous lungs. No pneumothorax or residual pleural effusion.   SUMMARY OF LABORATORY STUDIES: Are as follows: Preprocedure hemoglobin was 13.1 with a white blood cell count of 11,700 and a normal differential. The nadir value was 8.0, 24 hours post surgery. The patient did receive transfusion at this time. Discharge hemoglobin 10.2.   MEDICATIONS:  At the time of discharge included simvastatin 40 mg daily, quinapril 20 mg daily, amlodipine 5 mg daily, aspirin 81 mg daily,  metoprolol 25 mg daily, fluconazole topical cream t.i.d., fluconazole spray daily, Advil p.Carpenter.n.,  acetaminophen/hydrocodone p.Carpenter.n., prednisone 10 mg tablet, 60 mg taper. Augmentin 875 b.i.d., Lasix 20 mg daily.   Home going instructions were provided.      ____________________________ [name], MD jwb:dmm D: 05/14/2013 11:23:42 ET T: 05/14/2013 12:09:15 ET JOB#: [id number]  cc: [name], MD, <Dictator> Darlin Priestly. Lady Gary, MD Demetrios Isaacs Sherrie Mustache, MD Izabellah Dadisman Brion Aliment MD ELECTRONICALLY SIGNED 05/14/2013 17:09

## 2014-08-16 NOTE — Consult Note (Signed)
Brief Consult Note: Diagnosis: ac respi failure.   Patient was seen by consultant.   Consult note dictated.   Recommend further assessment or treatment.   Orders entered.  Electronic Signatures: Altamese DillingVachhani, Xavier Munger (MD)  (Signed 01-Jan-15 10:58)  Authored: Brief Consult Note   Last Updated: 01-Jan-15 10:58 by Altamese DillingVachhani, Ercel Pepitone (MD)

## 2014-08-16 NOTE — Op Note (Signed)
PATIENT NAME:  Calvin Carpenter, Calvin Carpenter MR#:  [id number] DATE OF BIRTH:  1930/05/16  DATE OF PROCEDURE AND DICTATION:  04/23/2013  PREOPERATIVE DIAGNOSIS: Recurrent left inguinal hernia.   POSTOPERATIVE DIAGNOSIS: Recurrent left inguinal hernia.   OPERATIVE PROCEDURE:  1. Laparoscopy with removal of previously placed prosthetic mesh.  2. Left open inguinal hernia repair with a medium Ultrapro mesh.   SURGEON: [name], MD  ANESTHESIA: General endotracheal under Dr. Randel Pigg, Marcaine 0.5% plain 25 mL local infiltration.   ESTIMATED BLOOD LOSS: Less than 20 mL.   CLINICAL NOTE: This 81 year old male underwent laparoscopic repair of a left direct inguinal hernia under 6 months ago. The patient had a 42-month followup exam and was noted to have recurrent herniation. He has been symptomatic. He is planned for return to the operating room to reassess the previously placed mesh and repair of the hernia.   OPERATIVE NOTE: With the patient under adequate general endotracheal anesthesia, the abdomen and lower and upper groins were prepped with ChloraPrep and draped. Care was taken to avoid a previously placed fem-fem crossover graft that came within 3 cm of the umbilicus. In Trendelenburg position, a small transumbilical incision was made and the Veress needle passed. After assuring intra-abdominal location with the hanging drop test, the abdomen was insufflated with CO2 at 10 mmHg pressure. A 10 mm Step port was expanded. Inspection showed no evidence of injury from initial port placement. A 5 mm left lateral port was placed under direct vision. There were a few filmy adhesions to the anterior abdominal wall. These were taken down with gentle dissection. Scant pinpoint bleeding stopped spontaneously. The mesh was found to have pulled away from the medial aspect, and the absorbable tacks previously placed to have nearly completely resorbed. The mesh had not incorporated into the anterior abdominal wall, and  with gentle traction, it was easily pulled free. The sigmoid colon which had slid under the mesh was returned to the intra-abdominal cavity, and the sac was unremarkable. No adhesions within it. No bleeding was noted. The abdomen was then desufflated under direct vision. The fascia at the umbilicus was approximated with an 0 Maxon suture. Skin incisions were closed with 4-0 Vicryl subcuticular suture. The small vessel within the lateral port site was controlled with the above-mentioned 4-0 Vicryl suture. Attention was then turned to the left groin. A 4 - 5 cm incision along the anticipated course of the inguinal canal was made and carried down through the skin and subcutaneous tissue with hemostasis achieved by electrocautery. Care was taken to avoid the crossover graft as noted above. The external oblique was opened in the direction of its fibers. The vas and vessels were mobilized. No evidence of an indirect defect. The direct hernia was freed circumferentially from the fascia superficial to it to allow placement of a medium Ultrapro mesh. The internal component was smoothed into the preperitoneal space. The sac was left intact to provide an adequate buffer between the mesh and the sigmoid colon. The external component was smoothed along the inguinal canal. It was anchored to the pubic tubercle and then along the inguinal ligament with interrupted 0 Surgilon sutures. The medial and superior aspects were anchored to the transverse abdominis aponeurosis with similar sutures. The patient had a vigorous coughing episode at this time, and there was no evidence of weakness of the repair site. Toradol was placed into the wound and the external oblique closed with a running 2-0 Vicryl. Scarpa's fascia was closed with a running 3-0  Vicryl and the skin closed with a running 4-0 Vicryl subcuticular suture. Benzoin and Steri-Strips followed by Telfa and Tegaderm dressings were applied to the wound.   The patient had an  episode of hypotension during the procedure requiring phenylephrine. When brought to the recovery room, his blood pressure was low, and several additional doses were required. He was subsequently admitted for evaluation of his hypotension.   ____________________________ Earline MayotteJeffrey W. Ghazal Pevey, MD jwb:lb D: 04/23/2013 12:15:33 ET T: 04/23/2013 13:30:23 ET JOB#: [id number]  cc: Earline MayotteJeffrey W. Lashawnna Lambrecht, MD, <Dictator> Demetrios Isaacsonald E. Sherrie MustacheFisher, MD Marcina MillardAlexander Paraschos, MD Keeghan Bialy Brion AlimentW Miamor Ayler MD ELECTRONICALLY SIGNED 04/26/2013 13:42

## 2014-08-16 NOTE — Consult Note (Signed)
Present Illness 81 yo male with history of cad with cardiac cath in 2007 revealing occluded rca filled by collaterals with disease in the left circumflex treated medically as well as aortic stenosis. He underwent repair of inguinal hernia. Post op course was complicated by drop in hemoglobin from 13 to 9 as well as hypotension. Marland Kitchen. He recieved transfusion. He also was hypoxic requiring high flow oxygen.  This was felt to be secondary to pneumonia. He is currently on emperic antibiotics. He denies any chest pain. EKG has revealed sinus rhythm with pacs and pvcx. There are several strips which reveal transient afib vs pat. There have been no sustained episodes of afib. Serum troponins were ordered and initial troponin was 7.2. EKG does not reveal any ischmic changes.Pts hypoxia has improved currently.   Physical Exam:  GEN disheveled   HEENT PERRL, moist oral mucosa   NECK supple  No masses   RESP wheezing  rhonchi   CARD Regular rate and rhythm  Murmur   Murmur Systolic   Systolic Murmur Out flow   ABD hypoactive BS   LYMPH negative neck   EXTR negative cyanosis/clubbing, negative edema   SKIN normal to palpation   NEURO cranial nerves intact, motor/sensory function intact   PSYCH alert   Review of Systems:  Subjective/Chief Complaint sob. denies chest pain   General: Fatigue   Skin: No Complaints   ENT: No Complaints   Eyes: No Complaints   Neck: No Complaints   Respiratory: Short of breath  Wheezing   Cardiovascular: No Complaints   Gastrointestinal: incisional discomfort   Genitourinary: No Complaints   Vascular: No Complaints   Musculoskeletal: No Complaints   Neurologic: No Complaints   Hematologic: No Complaints   Endocrine: No Complaints   Psychiatric: No Complaints   Review of Systems: All other systems were reviewed and found to be negative   Medications/Allergies Reviewed Medications/Allergies reviewed   EKG:  EKG NSR   Abnormal NSSTTW  changes   Interpretation no acute ischemic changes.    Lescol: Rash  Crestor: Other   Impression 81 yo male with history of cad treated medically, moderate aortic stenosis treated medically by Dr. Darrold JunkerParaschos who is post op inguinal herniorphy. Post op course has been complicated by hypotension, anemia and hypoxia. EKG has shown sr with pacs and pvcs with transient pat vs nonsustained afib. He denies any chest pain. He had a drop in his hgb from 13 to 9 He recieved transfusion for this. He also had hypoxia which required high flow oxygen due to pneumonia.  He also was somewaht hypotensive. He is currently normotensive and denis chest paain with shortness of breath. He had troponins drawn with initial result of 7. Etiology of this is possible demand ischemia due to hypoxia, hypotension wiht known cad treated medically and fixed cardiac output wiht aortic valvular disese. ACS may also be playing a role but pt denies chest pain and there are no acue ekg changes. Etiology of arrythmia is likely seocndary to hypoxia and anemia.   Plan 1. Continue to treat hypoxia with high dose oxygen as needed and wean. 2. Keep hgb greater than 9 3. Given recent surgery, would use heparin only if ok from surgical standpoint. would use asa. 4. Conitnue beta blockers.  5. Not a candidate for cardiac cath at present given airspace disease and probable demand ischemia rather than acute coronary event 6. Aggressive treatment of pneumonia 7. No indication for chronic anticaogulaiton given brief nature of possible afib  and recent surgery   Electronic Signatures: Dalia Heading (MD)  (Signed 01-Jan-15 16:05)  Authored: General Aspect/Present Illness, History and Physical Exam, Review of System, Home Medications, EKG , Allergies, Impression/Plan   Last Updated: 01-Jan-15 16:05 by Dalia Heading (MD)

## 2014-08-16 NOTE — Consult Note (Signed)
Brief Consult Note: Diagnosis: aortic stenosis.   Patient was seen by consultant.   Recommend further assessment or treatment.   Comments: 81 yo male with history of valvular heart disease with aortic stenosis who is post op ingu9inal hernia repair. Telemetry strips have shown sinus rhythm with frequent pacs and pvcs. One brief episode that may have been transient afib but predominantly sr. WOuld continue with current medical treatment. Does not appear to have signficnat arrythmia. Would not anticoagulate chornically at this point. Will follow rhythm and rate control with beta blockers as you are doing. Will follow with you. ..  Electronic Signatures: Dalia HeadingFath, Jolonda Gomm A (MD)  (Signed 01-Jan-15 13:30)  Authored: Brief Consult Note   Last Updated: 01-Jan-15 13:30 by Dalia HeadingFath, Jerrald Doverspike A (MD)

## 2014-08-16 NOTE — Consult Note (Signed)
PATIENT NAME:  Calvin Carpenter, Calvin Carpenter MR#:  [id number] DATE OF BIRTH:  06/18/1930  DATE OF CONSULTATION:  04/25/2013  REFERRING PHYSICIAN:   Dr. Donnalee CurryByrnett, Jeffrey CONSULTING PHYSICIAN:  Hope PigeonVaibhavkumar G. Elisabeth PigeonVachhani, MD  REASON FOR MEDICAL CONSULTATION:  Acute respiratory failure, worsening short of breath.   HISTORY OF PRESENT ILLNESS: An 81 year old male who has history of chronic smoking in the past and some allergy issues, but not COPD; also had hypertension and hyperlipidemia, who was living an active life. Had a hernia repair surgery a few years ago and started having hernia at the same place again and so was seen by Dr. Evette CristalSankar and Dr. Lemar LivingsByrnett, and scheduled for having hernia repair surgery again, which was done on 12/30 under general anesthesia. After the surgery, he was noted having some hypotension and given total 3 liters of fluid during and after the surgical time. As per the patient, after extubation he also had some cough with some hypoxia, so he was admitted for further observation in hospital after the surgery. His postsurgical period was complicated by continuous feeling short of breath, which is gradually getting worse in the last 2 days to the level that today, in the morning, he was needed to be placed on nonrebreather mask. He also had some cough and feeling tightness in his chest, mainly in the throat also but denies any sputum production or fever. He also had some abdominal distention and some pain postop was suspected having ileus but surgery is managing this issue. And he had some bowel movement and started on some liquid diet, so this issue is resolving. Some drop in the hemoglobin postsurgically and received blood transfusion last night. Hemoglobin appears to be stable, though it dropped from presurgical time from 13 to 9 now.  REVIEW OF SYSTEMS:   CONSTITUTIONAL: Negative for fever, fatigue, generalized weakness or weight loss.  EYES: No blurring, double vision, discharge or redness.  EARS,  NOSE, THROAT: No tinnitus, ear pain or hearing loss.  RESPIRATORY: Has some cough and shortness of breath, but no wheezing and no chest pain.  CARDIOVASCULAR: No chest pain, orthopnea, edema or palpitations.  GASTROINTESTINAL: No nausea, vomiting, diarrhea or abdominal pain currently. Had some abdominal pain and constipation in the last 2 days, but it is resolved. GENITOURINARY: No dysuria, hematuria or increased frequency.  ENDOCRINE: No heat or cold intolerance. No increased sweating.  NEUROLOGICAL: No numbness, weakness, tremor or vertigo. PSYCHIATRIC: No anxiety, insomnia, bipolar disorder.   PAST MEDICAL HISTORY: 1.  Allergies and nasal stuffiness, ex-smoker stopped 2 years ago. Smoked for 50 years half pack every day.  2.  Hypertension.  3.  Hyperlipidemia.   PAST SURGICAL HISTORY: 1.  Hernia repair surgery.  2.  Carotid endarterectomy on right side.  3.  Revision of hernia repair surgery on 04/23/2013.   SOCIAL HISTORY: Ex-smoker, was smoking half pack for 50 years, stopped 2 to 3 years ago. Does not have COPD and does not require any oxygen at home. He is able to walk without any shortness of breath and do his day-to-day activities.   FAMILY HISTORY: Noncontributory. No diabetes.  PHYSICAL EXAMINATION: VITAL SIGNS:  Temperature 97.4. In the last 2 days, there is not any spike of fever. Heart rate ranging from 70 to 90, respiration ranging from 18 to 20, blood pressure 135/64 and pulse oximetry was 93 at rest. After the surgery on room air, gradually got worse and started on 2 liters nasal cannula oxygen on 31st December in the morning and then  he required up to 4 liters nasal cannula oxygen to maintain oxygen in 90% to 91% all day, now at 5 to the morning started on nonrebreather mask to maintain 92% oxygen saturation.  GENERAL: The patient is fully alert and oriented, slight distress due to respiratory failure. He is cooperative with history taking and physical examination.  HEENT:  Head and neck atraumatic. Conjunctivae pink. Oral mucosa moist.  NECK: Supple. Jugular venous distention present.  RESPIRATORY: Bilateral decreased air entry. There is not much air movement I can hear and so it is hard to hear any wheezing or crepitation. The patient is taking shallow breathing, not able to take deep breath, using only with the mask currently. CARDIOVASCULAR: S1, S2 present, irregular. No murmur.  ABDOMEN: Soft, nontender, mild distention. Bowel sounds present. No organomegaly felt.  SKIN: No rashes. LEGS: No edema.  NEUROLOGICAL: Power 5/5. Follows commands. Moves all 4 limbs. No tremors.  PSYCHIATRIC: Does not appear in any acute psychiatric illness at this time.  IMPORTANT LABORATORY RESULTS: Creatinine 0.91, BUN 19. Sodium 129, potassium 4.0, chloride 95 and CO2 of 30, calcium 8.5. White cell count today is 21,300; hemoglobin which was around 13 in the beginning of this month, now dropped to 8 yesterday afternoon and after transfusion came up to 9 to the morning. Platelet count stable at 164.  PH 7.32; pCO2 46 and pO2 51 with 100% FiO2 on nonrebreather mask on ABG.  Chest x-ray, PA and lateral, to the morning interval development of bilateral lower lobe airspace disease, concerning for multilobar pneumonia versus aspiration pneumonia. Abdomen flat and erect shows no bowel obstruction. No significant bowel distention. CT scan of the chest, abdomen and pelvis with contrast was done yesterday. There were postop changes and some ascites collection with some air present in the pelvis, most likely postop changes, which was done yesterday with contrast.   ASSESSMENT AND PLAN: An 81 year old male who was a smoker but not chronic obstructive pulmonary disease admitted for hernia repair surgery and had some hypotension and some coughing after the surgery. Started feeling some shortness of breath after the surgery which is gradually getting worse and requiring nonrebreather mask to the  morning with severe hypoxia.  1.  Acute respiratory failure as evident by chest x-ray. Most likely it is aspiration pneumonitis. His history fits with having some coughing and hypotension after general anesthesia after extubation and then gradually worsening. Chest x-ray both lower lobe infiltrates. Currently, he is on nonrebreather mask ABG, is severely hypoxic, so we will continue giving him high-flow nasal cannula oxygen to increase his oxygenation. We will also give him nebulizer treatment with Xopenex and continue monitoring his respiratory status. If it gets worse, he might need more intense monitoring in stepdown or Critical Care Unit. Spoke to Dr. Meredeth Ide, pulmonary on-call doctor and he will also see the patient. Meanwhile, we will get CT scan of the chest to evaluate further about his respiratory findings on x-ray and we will get it with contrast to look for pulmonary embolism, though this is less likely but being postoperative worsening, we want to make sure.  2.  Pneumonia, most likely it is aspiration, but being severe respiratory distress, we would like to cover it with broad-spectrum antibiotic with vancomycin and Zosyn. We might cut it down as the patient start improving.  3.  Atrial fibrillation. This is new. Most likely it is secondary to acute respiratory distress and hypoxia. Will continue monitoring on telemetry. We will get echocardiogram and follow serial troponin.  Currently, no anticoagulation at this time as the primary cause is hypoxia and we will treat the underlying cause.  4.  Hypertension. Continue with metoprolol.  5.  Evidence of congestive heart failure with some jugular venous distention and decreased air entry bilaterally. He was given 3 liters IV fluid in postsurgical period, so even though we do not have any previous record, I would like to give him IV Lasix 1 dose and check for the improvement as he is currently very ill and we will get echocardiogram for further evaluation  of this issue.   CONDITION: Critical.  TOTAL TIME SPENT:  60 minutes on this consult.  I spoke to Dr. Mayo Ao about following up this patient.  CODE STATUS:  FULL CODE.    ____________________________ Hope Pigeon Elisabeth Pigeon, MD vgv:ce D: 04/25/2013 12:15:31 ET T: 04/25/2013 12:57:35 ET JOB#: [id number]  cc: Hope Pigeon. Elisabeth Pigeon, MD, <Dictator> Altamese Dilling MD ELECTRONICALLY SIGNED 05/01/2013 13:53

## 2014-08-17 NOTE — H&P (Signed)
PATIENT NAME:  Calvin Carpenter, Calvin Carpenter MR#:  [id number] DATE OF BIRTH:  October 18, 1930  DATE OF ADMISSION:  06/09/2011  PRIMARY CARE PHYSICIAN: Mila Merry, MD  CHIEF COMPLAINT: Wheezing, cough, sputum production, and increased shortness of breath.   HISTORY OF PRESENT ILLNESS: Calvin Carpenter is a 81 year old pleasant Caucasian male with history of hypertension, chronic obstructive pulmonary disease, ongoing tobacco use, and history of peripheral vascular disease. The patient was in his usual state of health until this last weekend when he started to have cough associated with sputum production, increased wheezing, and progressive increase in shortness of breath until he decided to come to the emergency department today. Evaluation here was consistent with a touch of pneumonia and chronic obstructive pulmonary disease exacerbation.   REVIEW OF SYSTEMS: CONSTITUTIONAL: Denies having fever at home, little chills if any. No fatigue. EYES: Denies any double vision. No blurring of vision. ENT: No hearing impairment. No sore throat. No dysphagia. CARDIOVASCULAR: No chest pain. Admits to having shortness of breath and wheezing. No syncope. RESPIRATORY: Admits having cough, sputum production, wheezing, and shortness of breath. GASTROINTESTINAL: No abdominal pain, no nausea, no vomiting, and no diarrhea. GENITOURINARY: No dysuria or frequency of urination. MUSCULOSKELETAL: No joint swelling or pain. No muscular pain or swelling. INTEGUMENTARY: No skin rash. No ulcers. NEUROLOGY: No focal weakness. No seizure activity. No headache. PSYCHIATRY: No anxiety. No depression. ENDOCRINE: Denies nocturia. No heat or cold intolerance.   PAST MEDICAL HISTORY:  1. History of systemic hypertension.  2. History of chronic obstructive pulmonary disease.  3. History of peripheral vascular disease with history of abdominal aortic aneurysm.  4. History of right carotid endarterectomy.  5. History of hypercholesterolemia.   PAST SURGICAL  HISTORY:  1. History of right carotid endarterectomy.  2. History of femoropopliteal bypass surgery. 3. History of left inguinal hernia repair.   FAMILY HISTORY: Two brothers and one sister have hypertension. No family history of lung disease.   SOCIAL HABITS: Chronic smoker for more than 65 years. He used to smoke 1 pack a day, but he cut down to 1/4 pack a day. No history of alcohol or drug abuse.   SOCIAL HISTORY: He is married and living with his wife. He is retired. 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   ADMISSION MEDICATIONS:  1. Verapamil 240 mg once a day in the morning and he takes Amlodipine 10 mg in the evening. 2. Lisinopril 10 mg a day. 3. Simvastatin 40 mg a day. 4. Metoprolol 25 mg once a day. 5. Hydrochlorothiazide 25 mg once a day. 6. Aspirin 81 mg a day.   ALLERGIES: Lescol and Crestor causing skin rash. The patient has no problem with simvastatin.   PHYSICAL EXAMINATION:   VITAL SIGNS: Blood pressure 138/65, respiratory rate 18, pulse 94, temperature 98.5, and oxygen saturation on oxygen was 94%.   GENERAL APPEARANCE: Elderly male lying down on a stretcher, in no acute distress.   HEAD AND NECK: No pallor. No icterus. No cyanosis.  EAR, NOSE, AND THROAT: Hearing was normal. Nasal mucosa, lips, and tongue were normal. He has dentures.   EYES: Normal iris and conjunctivae. Pupils are about 3 to 4 mm, equal and reactive to light.   NECK: Supple. Trachea at midline. No thyromegaly. No cervical lymphadenopathy.   HEART: Normal S1 and S2. There is a mid systolic murmur, grade 3/6, at the apex and left sternal border. There is a right carotid bruit.   LUNGS: Normal breathing pattern without use of accessory muscles.  Few scattered rhonchi and wheezing. There are fine crackles at the left base.   ABDOMEN: Soft without tenderness. No hepatosplenomegaly. No masses. No hernias.   SKIN: No ulcers. No subcutaneous nodules.   MUSCULOSKELETAL: No joint swelling. No  clubbing.   NEUROLOGIC: Cranial nerves II through XII are intact. No focal motor deficit.   PSYCHIATRY: The patient is alert and oriented x3. Mood and affect were normal.   LABS/STUDIES: Chest x-ray, PA film, did not show any consolidation. There is hyperinflation consistent with his chronic obstructive pulmonary disease. Heart size was normal. On lateral film, there are infiltrates in the retrocardiac area.   EKG showed normal sinus rhythm at rate of 98 per minute, unremarkable EKG.  Serum glucose 126, BUN 9, creatinine 0.8, sodium 139, potassium 3.6. Liver function tests were normal. Troponin less than 0.02. CBC showed elevated white count at 13,700, hemoglobin 11.5, hematocrit 35, and platelet count 273.   ASSESSMENT:  1. Pneumonia.  2. Chronic obstructive pulmonary disease exacerbation.  3. History of systemic hypertension.  4. History of peripheral vascular disease.  5. History of hypercholesterolemia.   PLAN: Admit the patient. IV antibiotic using Rocephin and Zithromax. DuoNebs every 4 hours. I will not use steroids at this point unless his condition worsens. I think he will do fine with adjustment of antibiotics and the bronchodilator therapy. I will continue the home medications as listed above. For peptic ulcer disease prophylaxis, I placed him on Protonix 40 mg a day. For    deep vein thrombosis prophylaxis, I initiated Lovenox 40 mg subcutaneous once a day. The patient needs to quit smoking and I will use a nicotine patch, 7 mg daily.   TIME NEEDED TO EVALUATE PATIENT: More than 50 minutes.  ____________________________ Carney CornersAmir M. Rudene Rearwish, MD amd:slb D: 06/09/2011 01:15:43 ET T: 06/09/2011 08:34:49 ET JOB#: [id number]  cc: Carney CornersAmir M. Rudene Rearwish, MD, <Dictator> Demetrios Isaacsonald E. Sherrie MustacheFisher, MD Carney CornersAMIR M Marsi Turvey MD ELECTRONICALLY SIGNED 06/17/2011 22:17

## 2014-08-17 NOTE — Discharge Summary (Signed)
PATIENT NAME:  Calvin Carpenter, Irwin R MR#:  [id number] DATE OF BIRTH:  Feb 09, 1931  DATE OF ADMISSION:  06/09/2011 DATE OF DISCHARGE:  06/09/2011  DISCHARGE DIAGNOSES:  1. Acute bronchitis.  2. Chronic obstructive pulmonary disease exacerbation.  3. Hypertension.  4. History of peripheral vascular disease. 5. History of abdominal aortic aneurysm status post surgery.  6. Hyperlipidemia. 7. History of carotid endarterectomy. 8. Femoropopliteal bypass surgery.   DISCHARGE MEDICATIONS:  1. Verapamil 240 mg in the morning and 10 mg of amlodipine in the evening. 2. Lisinopril 10 mg daily. 3. Simvastatin 40 mg daily. 4. Metoprolol 25 mg p.o. daily. 5. HCTZ 25 mg once a day.  6. Aspirin 81 mg daily.   NEW MEDICINE:  1. Amoxicillin 500 mg p.o. daily for seven days. 2. Z-Pak five day course. 3. Prednisone 40 mg daily to taper to 10 mg daily. 4. Spiriva one capsule inhalation daily.  5. Combivent metered-dose inhaler 2 puffs every six hours p.r.n. for wheezing.  6. Advair 250/50, one puff b.i.d.   FOLLOW-UP: The patient needs to follow-up with Dr. Mila Merryonald Fisher on Monday.   DIET: Low sodium diet.   CONSULTATIONS: None.  LABORATORY, RADIOLOGICAL AND DIAGNOSTIC DATA: On admission, blood cultures are negative. Chest x-ray showed chronic obstructive pulmonary disease with acute bronchitis and subsegmental atelectasis. No focal pneumonia. WBC 13.7, hemoglobin 11.3, hematocrit 35.1, platelets 273. Electrolytes on admission: Sodium 139, potassium 3.6, chloride 102, bicarbonate 27, BUN 9, creatinine 0.8, glucose 126.   HOSPITAL COURSE: The patient is a 81 year old male with history of smoking about five cigarettes a day who came in because of cough, trouble breathing and wheezing and increased sputum production. The patient was admitted for acute bronchitis with chronic obstructive pulmonary disease exacerbation. The patient's oxygen saturations were 94% on room air and afebrile. White count was slightly  elevated, so he was started on Albuterol and Atrovent nebulizer along with Rocephin, Zithromax and steroids. The patient showed very good improvement and wanted to go home now and says that he feels much better and also, we are going to discharge him steroids along with antibiotics and inhalers. Advised him to stop smoking. Until then, he will be on Combivent and Spiriva.we  will check oxygen saturations on room air and see on exertion, if he needs oxygen. The patient needs to see Dr. Sherrie MustacheFisher on Monday.   CONDITION AT THIS TIME: Stable. Discharge plan is discussed. The patient's lungs are much better with no wheezing.  Discussed the plan with his wife.    ____________________________ Katha HammingSnehalatha Evgenia Merriman, MD sk:ap D: 06/09/2011 13:32:20 ET T: 06/09/2011 16:55:41 ET JOB#: [id number]  cc: Katha HammingSnehalatha Ritik Stavola, MD, <Dictator> Demetrios Isaacsonald E. Sherrie MustacheFisher, MD Katha HammingSNEHALATHA Vernon Maish MD ELECTRONICALLY SIGNED 06/16/2011 9:17

## 2014-08-24 NOTE — Consult Note (Signed)
PATIENT NAME:  Calvin Carpenter, Calvin Carpenter MR#:  [id number] DATE OF BIRTH:  May 13, 1930  DATE OF CONSULTATION:  08/05/2014  REFERRING PHYSICIAN:   CONSULTING PHYSICIAN:  Marcina MillardAlexander Jaiyden Laur, MD  PRIMARY CARE PHYSICIAN:  Demetrios IsaacsDonald E. Sherrie MustacheFisher, M.D.  CARDIOLOGIST:  Marcina MillardAlexander Kade Demicco, M.D.   CHIEF COMPLAINT: Shortness of breath.   REASON FOR CONSULTATION: Consultation requested for evaluation of congestive heart failure.   HISTORY OF PRESENT ILLNESS: The patient is an 81 year old gentleman with a history of hypertension, diastolic congestive heart failure, aortic stenosis, and O2-dependent COPD.  The patient reports he developed a rash on hydrochlorothiazide which was held by his primary care provider. Subsequently, the patient developed fluid retention, peripheral edema, shortness of breath, and deterioration of his respiratory status. [facility] Room where he was treated with BiPAP therapy and diuresis, admitted to telemetry. Admission labs were notable for borderline elevated troponin of 0.04 with a followup troponin of 0.15. The patient currently denies chest pain. Overall, his breathing has improved following diuresis.   PAST MEDICAL HISTORY:  1.  Chronic diastolic congestive heart failure.  2.  Hypertension.  3.  Aortic Stenosis.  4.  O2-dependent COPD.  MEDICATIONS ON ADMISSION: Aspirin 81 mg daily, quinapril 20 mg daily, simvastatin 40 mg at bedtime, metoprolol 25 mg daily, amlodipine 5 mg daily.   SOCIAL HISTORY: The patient quit tobacco abuse approximately 3 years ago.   FAMILY HISTORY: No immediate family history of coronary artery disease or myocardial infarction.   REVIEW OF SYSTEMS: CONSTITUTIONAL: No fever or chills.  EYES: No blurry vision.  EARS: No hearing loss.  RESPIRATORY: Shortness of breath as described above.  CARDIOVASCULAR: No chest pain.  GASTROINTESTINAL: No nausea, vomiting, or diarrhea.  GENITOURINARY: No dysuria or hematuria.  ENDOCRINE: No polyuria  or polydipsia.  MUSCULOSKELETAL: No arthralgias or myalgias.  NEUROLOGICAL: No focal muscle weakness or numbness.  PSYCHOLOGICAL: No depression or anxiety.   PHYSICAL EXAMINATION:  VITAL SIGNS: Blood pressure 155/67, pulse 102, respirations 20, temperature 98.4, pulse oximetry 88%.  HEENT: Pupils equal, reactive to light and accommodation.  NECK: Supple without thyromegaly.  LUNGS: Reveal decreased breath sounds at both bases.  HEART: Normal JVP. Normal PMI. Regular rate and rhythm. Normal S1, S2. No appreciable gallop, murmur, or rub.  ABDOMEN: Soft and nontender. Pulses were intact bilaterally.  MUSCULOSKELETAL: Normal muscle tone.  NEUROLOGIC: The patient is alert and oriented x 3. Motor and sensory both grossly intact.   IMPRESSION: An 81 year old gentleman with history of hypertension and diastolic congestive heart failure who presents with worsening peripheral edema and shortness of breath as well as chronic obstructive pulmonary disease exacerbation after holding hydrochlorothiazide. The patient has responded to initial diuresis with furosemide. The patient has borderline elevated troponin in the absence of chest pain or ECG changes likely due to demand supply ischemia.   RECOMMENDATIONS:  1.  Agree with overall current therapy.  2.  Would defer full dose anticoagulation at this time.  3.  Continue diuresis.  4.  Consider 2D echocardiogram if not done in the past 6 months.  5.  Further recommendations pending patient's initial clinical course.   ____________________________ Marcina MillardAlexander Kentravious Lipford, MD ap:sp D: 08/05/2014 08:48:02 ET T: 08/05/2014 09:04:17 ET JOB#: [id number]  cc: Marcina MillardAlexander Haely Leyland, MD, <Dictator> Marcina MillardALEXANDER Jaxyn Mestas MD ELECTRONICALLY SIGNED 08/19/2014 17:16

## 2014-08-24 NOTE — Discharge Summary (Signed)
PATIENT NAME:  Calvin Carpenter, Calvin Carpenter MR#:  [id number] DATE OF BIRTH:  1931-04-20  DATE OF ADMISSION:  08/05/2014 DATE OF DISCHARGE:  08/06/2014  For a detailed note, please see the history and physical done on admission by Dr. Angelica Ran.  DIAGNOSES AT DISCHARGE: Are as follows: 1. Acute respiratory failure with hypoxia.  2. Congestive heart failure.  3. Acute and chronic diastolic dysfunction.  4. Chronic obstructive pulmonary disease exacerbation.  5. Hypertension.  6. History of moderate to severe aortic stenosis and anxiety.   DIET:  The patient is being discharged on a low-sodium, low-fat diet.   ACTIVITY: As tolerated.   FOLLOWUP:  Follow up with Dr. Jamse Mead in 1 week.  Also follow up with Dr. Mila Merry in 1 to 2 weeks.   DISCHARGE MEDICATIONS: Simvastatin 40 mg at bedtime, quinapril 20 mg at bedtime, amlodipine 5 mg daily, metoprolol tartrate 25 mg daily, Flonase 1 spray to each nostril daily, Advil 200 mg q. 6 hours. as needed, Tylenol with hydrocodone 5/325 mg 1 tab q. 4-6 hours as needed, Lasix 20 mg daily, and prednisone taper starting at 50 mg, down to 10 mg over the next 5 days. Also oxygen 2 liters nasal cannula continuously.   CONSULTANTS THE HOSPITAL COURSE:  Dr. Jamse Mead from cardiology.   PERTINENT STUDIES DONE DURING THE HOSPITAL COURSE:  Are as follows, chest x-ray done on admission showing severe emphysema with superimposed acute interstitial pulmonary edema.   BRIEF HOSPITAL COURSE: This is an 81 year old male with medical problems as mentioned above, presented to the hospital with shortness of breath and noted to be in acute respiratory failure.    PROBLEMS: 1. Acute respiratory failure with hypoxia. This was likely secondary to a combination of mild CHF and also COPD.  The patient was treated for both conditions. He was diuresed with IV Lasix for his CHF and is about 3 liters negative since admission and has clinically improved. He was also given IV  steroids for his COPD and his wheezing bronchospasm has also improved. Presently he is being discharged on oral Lasix and oral prednisone taper. He was also ambulated and did qualify for home oxygen and is being arranged for that. He was initially on BiPAP, but was quickly weaned off of it and was on nasal cannula for the past 24 hours and doing well. 2. CHF. This is acute on chronic diastolic dysfunction. The patient had an echocardiogram done about a year ago, which showed EF of 55% to 60% with moderate to severe aortic stenosis. The patient was diuresed with IV Lasix, has clinically improved. He is about 3 liters negative since admission. The patient will be discharged on oral Lasix along with oral beta blocker and ACE inhibitor as stated. The patient will have close follow-up with cardiology as an outpatient.  3. Elevated troponin. This was likely in the setting of demand ischemia from hypoxia. There was no evidence of acute coronary syndrome. Cardiology did consult and the patient did not recommend any acute intervention.  4. Hypertension. The patient remained hemodynamically stable. He will continue his Norvasc, metoprolol, and quinapril.  5. COPD. The patient has underlying severe emphysema. He was treated with IV steroids in the hospital and is being discharged on oral prednisone taper. He did qualify for home oxygen, which is being arranged for him prior to discharge.   CODE STATUS: The patient is a FULL CODE. He is being discharged home.  Time spent with discharge is 40 minutes.  ____________________________ Rolly PancakeVivek J. Cherlynn KaiserSainani, MD [id number] D: 08/06/2014 16:36:59 ET T: 08/06/2014 17:30:56 ET JOB#: [id number]  cc: Rolly PancakeVivek J. Cherlynn KaiserSainani, MD, <Dictator> Marcina MillardAlexander Paraschos, MD Demetrios Isaacsonald E. Sherrie MustacheFisher, MD  Houston SirenVIVEK J SAINANI MD ELECTRONICALLY SIGNED 08/13/2014 11:29

## 2014-08-24 NOTE — H&P (Signed)
PATIENT NAME:  Calvin Carpenter, Calvin Carpenter MR#:  [id number] DATE OF BIRTH:  [birth year]  DATE OF ADMISSION:  08/05/2014  REFERRING PHYSICIAN:  Eartha Inch. York Cerise, MD  PRIMARY CARE PHYSICIAN:  Demetrios Isaacs. Sherrie Mustache, MD   CARDIOLOGIST:  [facility]   CHIEF COMPLAINT:  Shortness of breath.   HISTORY OF PRESENT ILLNESS:  An 81 year old Caucasian gentleman with past medical history of congestive heart failure, diastolic, with EF of 60% as well as aortic valve stenosis; COPD, non-O2 dependent; and hypertension, essential, presenting with shortness of breath. He describes a 2-day duration of shortness of breath, dyspnea on exertion, as well as worsening lower extremity edema and orthopnea. Denies any fevers, chills, or chest pain, however, does describe some chest tightness. Denies any cough. He decided to present to the hospital for further workup and evaluation. Upon arrival, his condition deteriorated somewhat with increased work of breathing and was actually placed on BiPAP therapy. His condition has improved after BiPAP. Also of note, he was transiently in atrial fibrillation according to telemetry in the Emergency Department, however, resolved after being placed on BiPAP, now with normal sinus rhythm.   REVIEW OF SYSTEMS: CONSTITUTIONAL:  Denies fevers or chills. Positive for fatigue and weakness.  EYES:  Denies blurred vision, double vision, or eye pain.  EARS, NOSE, AND THROAT:  Denies tinnitus, ear pain, or hearing loss.  RESPIRATORY:  Positive for shortness of breath as stated above. Denies any cough or wheezing.  CARDIOVASCULAR:  Positive for chest pain. Denies any orthopnea or edema.  GASTROINTESTINAL:  Denies nausea, vomiting, diarrhea, or abdominal pain.  GENITOURINARY:  Denies dysuria or hematuria.  ENDOCRINE:  Denies nocturia or thyroid problems.  Hematologic and lymphatic:  Denies easy bruising or bleeding.  SKIN:  Denies rashes or lesions.  MUSCULOSKELETAL:  Denies pain in the neck, back, shoulders,  knees, or hips or arthritic symptoms.  NEUROLOGIC:  Denies paralysis or paresthesias.  PSYCHIATRIC:  Denies anxiety or depressive symptoms.   Otherwise, full review of systems performed by me is negative.   PAST MEDICAL HISTORY:  Includes hypertension, essential; hyperlipidemia, unspecified; COPD, non-O2 dependent; diastolic congestive heart failure; aortic valve stenosis.   SOCIAL HISTORY:  Remote tobacco use. No alcohol or drug use.   FAMILY HISTORY:  No known cardiovascular or pulmonary disorders.   ALLERGIES:  CRESTOR AND LESCOL.   HOME MEDICATIONS:  Include acetaminophen/hydrocodone 325/5 mg p.o. q. 4 to 6 hours as needed, aspirin 81 mg p.o. daily, Tylenol 325 mg 2 tabs p.o. q. 4 hours as needed for pain, quinapril 20 mg p.o. at bedtime, simvastatin 40 mg p.o. at bedtime, metoprolol tartrate 25 mg p.o. daily, amlodipine 5 mg p.o. daily, Lasix 20 mg p.o. daily.   PHYSICAL EXAMINATION: VITAL SIGNS:  Temperature 99.2, heart rate 115, respirations 19, blood pressure 146/71, saturating 97% on BiPAP therapy. Weight 77.1 kg, BMI 26.7.  GENERAL:  Well-nourished, well-developed, Caucasian gentleman currently in minimal distress given respiratory status, currently requiring BiPAP therapy.  HEAD:  Normocephalic, atraumatic.  EYES:  Pupils are equal, round, and reactive to light. Extraocular muscles are intact. No scleral icterus.  MOUTH:  Moist mucosal membranes. Dentition is intact. No abscess noted.  EARS, NOSE, AND THROAT:  Clear without exudates. No external lesions.  NECK:  Supple. No thyromegaly. No appreciable JVD.  PULMONARY:  Left basilar crackles and expiratory wheezing throughout the lung fields. Tachypneic without the use of accessory muscles. Currently on BiPAP therapy.  CHEST:  Nontender to palpation.  CARDIOVASCULAR:  S1 and S2, regular  rate and rhythm. No murmurs, rubs, or gallops. There is 1+ edema of the lower extremities. Pedal pulses are 2+ bilaterally.  GASTROINTESTINAL:   Soft, nontender, nondistended. No masses. Positive bowel sounds. No hepatosplenomegaly.  MUSCULOSKELETAL:  No swelling, clubbing, or edema. Range of motion is full in all extremities.  NEUROLOGIC:  Cranial nerves II through XII are intact. No gross focal neurological deficits. Sensation is intact. Reflexes are intact.  SKIN:  No ulceration, lesion, rash, or cyanosis. Skin is warm and dry. Turgor is intact.  PSYCHIATRIC:  Mood and affect are within normal limits. The patient is awake, alert, and oriented x 3. Insight and judgment are intact.   LABORATORY DATA:  Chest x-ray performed reveals severe emphysema with superimposed acute interstitial pulmonary edema. Sodium is 130, potassium 3.6, chloride 108, bicarbonate 24, BUN 17, creatinine 0.94, glucose 225. Troponin is 0.04. WBC is 16.4, hemoglobin 10.5, and platelets of 198,000.   ASSESSMENT AND PLAN:  An 81 year old Caucasian gentleman with a history of congestive heart failure, diastolic, ejection fraction 60%; aortic valve stenosis; and chronic obstructive pulmonary disease, presenting with shortness of breath.   1.  Acute-on-chronic diastolic congestive heart failure. Place on telemetry. Trend cardiac enzymes x 3. Diurese with Lasix. Follow urine output, renal function, and daily weights. Provide DuoNeb treatments q. 4 hours. We will wean BiPAP as tolerated. We will also add Solu-Medrol given prominent wheezing.  2.  Hypertension, essential. Continue with Lopressor and Norvasc.  3.  Hyperlipidemia. Continue with statin therapy.  4.  Venous thromboembolism prophylaxis with heparin subcutaneously.   CODE STATUS:  The patient is a full code.  TIME SPENT:  45 minutes.    ____________________________ Cletis Athensavid K. Hower, MD dkh:nb D: 08/05/2014 02:22:41 ET T: 08/05/2014 03:38:44 ET JOB#: [id number]  cc: Cletis Athensavid K. Hower, MD, <Dictator> DAVID Synetta ShadowK HOWER MD ELECTRONICALLY SIGNED 08/06/2014 2:11

## 2014-08-28 ENCOUNTER — Ambulatory Visit: Payer: PPO | Attending: Family | Admitting: Family

## 2014-08-28 ENCOUNTER — Encounter: Payer: Self-pay | Admitting: Family

## 2014-08-28 VITALS — BP 139/60 | HR 77 | Resp 20 | Ht 67.0 in | Wt 165.0 lb

## 2014-08-28 DIAGNOSIS — I509 Heart failure, unspecified: Secondary | ICD-10-CM | POA: Diagnosis not present

## 2014-08-28 DIAGNOSIS — Z79899 Other long term (current) drug therapy: Secondary | ICD-10-CM | POA: Insufficient documentation

## 2014-08-28 DIAGNOSIS — E785 Hyperlipidemia, unspecified: Secondary | ICD-10-CM | POA: Diagnosis not present

## 2014-08-28 DIAGNOSIS — I259 Chronic ischemic heart disease, unspecified: Secondary | ICD-10-CM | POA: Insufficient documentation

## 2014-08-28 DIAGNOSIS — I10 Essential (primary) hypertension: Secondary | ICD-10-CM

## 2014-08-28 DIAGNOSIS — I5032 Chronic diastolic (congestive) heart failure: Secondary | ICD-10-CM

## 2014-08-28 DIAGNOSIS — I4891 Unspecified atrial fibrillation: Secondary | ICD-10-CM | POA: Insufficient documentation

## 2014-08-28 DIAGNOSIS — I255 Ischemic cardiomyopathy: Secondary | ICD-10-CM | POA: Insufficient documentation

## 2014-08-28 NOTE — Progress Notes (Signed)
   Subjective:    Patient ID: Calvin Carpenter, male    DOB: [birth year], 81 y.o.   MRN: [id number]  Congestive Heart Failure Presents for initial visit. The disease course has been improving. Associated symptoms include edema and fatigue. Pertinent negatives include no chest pain, chest pressure, orthopnea, palpitations, paroxysmal nocturnal dyspnea, shortness of breath or unexpected weight change. The symptoms have been stable. Past treatments include ACE inhibitors, beta blockers and salt and fluid restriction. The treatment provided significant relief. Compliance with prior treatments has been good. His past medical history is significant for anemia and arrhythmia. There is no history of DM or DVT.      Review of Systems  Constitutional: Positive for fatigue. Negative for unexpected weight change.  HENT: Negative.   Eyes: Negative.   Respiratory: Negative for cough and shortness of breath.   Cardiovascular: Positive for leg swelling (bilateral lower legs). Negative for chest pain and palpitations.  Gastrointestinal: Negative.   Endocrine: Negative.   Genitourinary: Negative.   Musculoskeletal: Negative.   Skin: Positive for color change (eczema).  Neurological: Negative.   Psychiatric/Behavioral: Negative.        Objective:   Physical Exam  Constitutional: He is oriented to person, place, and time. He appears well-developed and well-nourished.  HENT:  Head: Normocephalic and atraumatic.  Eyes: Conjunctivae are normal. Pupils are equal, round, and reactive to light.  Neck: Normal range of motion. Neck supple.  Cardiovascular: Normal rate.  An irregular rhythm present.  Pulmonary/Chest: Effort normal and breath sounds normal.  Abdominal: Soft. There is no tenderness.  Musculoskeletal: Normal range of motion. He exhibits edema (1+ pitting edema in bilateral lower legs).  Neurological: He is alert and oriented to person, place, and time.  Skin: Skin is warm and dry. Rash (eczema  rash noted on hands and base of neck and around ears) noted.  Psychiatric: He has a normal mood and affect. His behavior is normal.          Assessment & Plan:  1: Chronic heart failure with preserved ejection fraction- Patient presents with minimal symptoms at this time. He does get a little tired on occasion but feels like it's normal for him. He did golf 18 rounds yesterday and did ride the golf cart and says he was a little tired after that. Once he rests for a few minutes, his fatigue improves. He does have some swelling in his lower legs that he attributes to his socks. He was encouraged to elevate his legs when he's at home. He already weighs himself daily and he was instructed to call for an overnight weight gain of >2 pounds or a weekly weight gain of >5 pounds. He is not using salt but does use "no salt" on certain foods like tomatoes or eggs. He is already reading food labels and understands the importance of following a 2000mg  sodium diet.   2: HTN: Blood pressure looks good today. Continue medications at this time.   3: Hyperlipidemia- Currently taking simvastatin at this time.   Return in one month or sooner for any questions/problems before then.

## 2014-10-09 ENCOUNTER — Ambulatory Visit: Payer: PPO | Admitting: Family

## 2014-10-23 ENCOUNTER — Ambulatory Visit: Payer: PPO | Admitting: Family

## 2014-10-24 ENCOUNTER — Ambulatory Visit: Payer: PPO | Attending: Family | Admitting: Family

## 2014-10-24 ENCOUNTER — Encounter: Payer: Self-pay | Admitting: Family

## 2014-10-24 VITALS — BP 120/47 | HR 73 | Resp 20 | Ht 67.0 in | Wt 165.0 lb

## 2014-10-24 DIAGNOSIS — I10 Essential (primary) hypertension: Secondary | ICD-10-CM | POA: Insufficient documentation

## 2014-10-24 DIAGNOSIS — E785 Hyperlipidemia, unspecified: Secondary | ICD-10-CM | POA: Diagnosis not present

## 2014-10-24 DIAGNOSIS — I4891 Unspecified atrial fibrillation: Secondary | ICD-10-CM | POA: Insufficient documentation

## 2014-10-24 DIAGNOSIS — I482 Chronic atrial fibrillation, unspecified: Secondary | ICD-10-CM

## 2014-10-24 DIAGNOSIS — Z87891 Personal history of nicotine dependence: Secondary | ICD-10-CM | POA: Insufficient documentation

## 2014-10-24 DIAGNOSIS — I252 Old myocardial infarction: Secondary | ICD-10-CM | POA: Diagnosis not present

## 2014-10-24 DIAGNOSIS — I509 Heart failure, unspecified: Secondary | ICD-10-CM | POA: Diagnosis present

## 2014-10-24 DIAGNOSIS — Z79899 Other long term (current) drug therapy: Secondary | ICD-10-CM | POA: Insufficient documentation

## 2014-10-24 DIAGNOSIS — I5032 Chronic diastolic (congestive) heart failure: Secondary | ICD-10-CM

## 2014-10-24 NOTE — Progress Notes (Signed)
Subjective:    Patient ID: Calvin Carpenter, male    DOB: [birth year], 81 y.o.   MRN: [id number]  Congestive Heart Failure Presents for follow-up visit. The disease course has been stable. Associated symptoms include edema (only on occasion) and fatigue (when walking long distances). Pertinent negatives include no abdominal pain, chest pain, orthopnea, palpitations, paroxysmal nocturnal dyspnea or shortness of breath. The symptoms have been stable. Past treatments include ACE inhibitors, beta blockers and salt and fluid restriction. The treatment provided significant relief. Compliance with prior treatments has been good. His past medical history is significant for arrhythmia and HTN.  Atrial Fibrillation Presents for follow-up visit. Symptoms are negative for bradycardia, chest pain, dizziness, palpitations, shortness of breath and tachycardia. The symptoms have been stable. Past treatments include beta blockers and Ca channel blockers. Compliance with prior treatments has been good. Past medical history includes atrial fibrillation, CHF, HTN and hyperlipidemia. There are no medication compliance problems.   Past Medical History  Diagnosis Date  . Allergy     seasonal  . Hypertension   . Hyperlipidemia   . Myocardial infarct 04-23-13    Past Surgical History  Procedure Laterality Date  . Groin surgery  2011  . Aneurisym  2009    stomach  . Corotid surgery Right 2008  . Vascular surgery Bilateral     Aortic aneurysm stent, right femoral-left femoral bypass  . Hernia repair Left 10-29-12    inguinal  . Hernia repair Left 04-23-13    inguinal    History  Substance Use Topics  . Smoking status: Former Smoker -- 1.00 packs/day for 65 years    Types: Cigarettes    Quit date: 04/26/2011  . Smokeless tobacco: Never Used  . Alcohol Use: No    Allergies  Allergen Reactions  . Fluvastatin Sodium Rash    Prior to Admission medications   Medication Sig Start Date End Date Taking?  Authorizing Provider  amLODipine (NORVASC) 5 MG tablet Take 5 mg by mouth daily. 08/22/12  Yes Historical Provider, MD  aspirin 81 MG tablet Take 81 mg by mouth daily.   Yes Historical Provider, MD  Fluocinonide 0.1 % CREA  02/22/13  Yes Historical Provider, MD  fluticasone (FLONASE) 50 MCG/ACT nasal spray Place 1 spray into both nostrils as needed for allergies or rhinitis.   Yes Historical Provider, MD  furosemide (LASIX) 20 MG tablet  05/01/13  Yes Historical Provider, MD  metoprolol succinate (TOPROL-XL) 25 MG 24 hr tablet Take 25 mg by mouth daily. 08/22/12  Yes Historical Provider, MD  montelukast (SINGULAIR) 10 MG tablet as needed.  01/25/13  Yes Historical Provider, MD  quinapril (ACCUPRIL) 20 MG tablet Take 20 mg by mouth daily. 08/22/12  Yes Historical Provider, MD  simvastatin (ZOCOR) 40 MG tablet Take 40 mg by mouth daily. 08/22/12  Yes Historical Provider, MD  verapamil (VERELAN PM) 240 MG 24 hr capsule Take 240 mg by mouth daily. 08/22/12  Yes Historical Provider, MD       Review of Systems  Constitutional: Positive for fatigue (when walking long distances). Negative for appetite change.  HENT: Negative for sore throat and trouble swallowing.   Eyes: Negative.   Respiratory: Negative for cough, chest tightness and shortness of breath.   Cardiovascular: Positive for leg swelling (on occasion). Negative for chest pain and palpitations.  Gastrointestinal: Negative for abdominal pain and abdominal distention.  Endocrine: Negative.   Genitourinary: Negative.   Musculoskeletal: Negative.  Negative for back pain and arthralgias.  Allergic/Immunologic: Negative.   Neurological: Negative for dizziness and headaches.  Hematological: Negative for adenopathy. Bruises/bleeds easily (sometimes).  Psychiatric/Behavioral: Negative for sleep disturbance (sleeping on 2 pillows). The patient is not nervous/anxious.        Objective:   Physical Exam  Constitutional: He is oriented to person,  place, and time. He appears well-developed and well-nourished.  HENT:  Head: Normocephalic and atraumatic.  Eyes: Conjunctivae are normal. Pupils are equal, round, and reactive to light.  Neck: Neck supple.  Cardiovascular: Normal rate and regular rhythm.   Pulmonary/Chest: Effort normal and breath sounds normal. He has no wheezes. He has no rales.  Abdominal: Soft. He exhibits no distension. There is no tenderness.  Musculoskeletal: He exhibits no edema or tenderness.  Neurological: He is alert and oriented to person, place, and time.  Skin: Skin is warm and dry.  Psychiatric: He has a normal mood and affect. His behavior is normal.  Nursing note and vitals reviewed.   BP 120/47 mmHg  Pulse 73  Resp 20  Ht  (1.702 m)  Wt 165 lb (74.844 kg)  BMI 25.84 kg/m2  SpO2 95%       Assessment & Plan:  1: Chronic heart failure with preserved ejection fraction- Patient presents with fatigue when walking long distances such as when he's walking his dog. When he does experience tiredness, he will stop to rest until his energy level improves which happens pretty quickly. He continues to weigh himself and says that his weight has been stable. Reminded to call for an overnight weight gain of >2 pounds or a weekly weight gain of >5 pounds. He is not adding any salt to his food and tries to follow a low sodium diet.  2: Atrial fibrillation- Currently rate controlled and in a regular rhythm. Continue metoprolol and verapamil.  3: Hyperlipidemia- Continues to take simvastatin without any known side effects.   Return in 6 months or sooner for any questions/problems before then.

## 2014-10-24 NOTE — Patient Instructions (Signed)
Continue weighing daily and call for an overnight weight gain of >2 pounds or a weekly weight gain of >5 pounds.  Do not add salt to your foods.

## 2014-12-02 ENCOUNTER — Encounter: Payer: Self-pay | Admitting: Family Medicine

## 2014-12-02 ENCOUNTER — Encounter: Payer: Self-pay | Admitting: *Deleted

## 2014-12-02 ENCOUNTER — Ambulatory Visit (INDEPENDENT_AMBULATORY_CARE_PROVIDER_SITE_OTHER): Payer: PPO | Admitting: Family Medicine

## 2014-12-02 VITALS — BP 126/56 | HR 59 | Temp 97.9°F | Resp 16 | Wt 165.0 lb

## 2014-12-02 DIAGNOSIS — G4700 Insomnia, unspecified: Secondary | ICD-10-CM | POA: Insufficient documentation

## 2014-12-02 DIAGNOSIS — J449 Chronic obstructive pulmonary disease, unspecified: Secondary | ICD-10-CM

## 2014-12-02 DIAGNOSIS — R5383 Other fatigue: Secondary | ICD-10-CM | POA: Insufficient documentation

## 2014-12-02 DIAGNOSIS — S46911A Strain of unspecified muscle, fascia and tendon at shoulder and upper arm level, right arm, initial encounter: Secondary | ICD-10-CM | POA: Diagnosis not present

## 2014-12-02 DIAGNOSIS — I739 Peripheral vascular disease, unspecified: Secondary | ICD-10-CM | POA: Insufficient documentation

## 2014-12-02 DIAGNOSIS — M7711 Lateral epicondylitis, right elbow: Secondary | ICD-10-CM | POA: Diagnosis not present

## 2014-12-02 DIAGNOSIS — R1032 Left lower quadrant pain: Secondary | ICD-10-CM | POA: Insufficient documentation

## 2014-12-02 DIAGNOSIS — I359 Nonrheumatic aortic valve disorder, unspecified: Secondary | ICD-10-CM | POA: Insufficient documentation

## 2014-12-02 DIAGNOSIS — R0981 Nasal congestion: Secondary | ICD-10-CM | POA: Diagnosis not present

## 2014-12-02 DIAGNOSIS — S46919A Strain of unspecified muscle, fascia and tendon at shoulder and upper arm level, unspecified arm, initial encounter: Secondary | ICD-10-CM | POA: Insufficient documentation

## 2014-12-02 DIAGNOSIS — A0472 Enterocolitis due to Clostridium difficile, not specified as recurrent: Secondary | ICD-10-CM

## 2014-12-02 DIAGNOSIS — I779 Disorder of arteries and arterioles, unspecified: Secondary | ICD-10-CM | POA: Insufficient documentation

## 2014-12-02 HISTORY — DX: Enterocolitis due to Clostridium difficile, not specified as recurrent: A04.72

## 2014-12-02 MED ORDER — SIMVASTATIN 40 MG PO TABS
40.0000 mg | ORAL_TABLET | Freq: Every day | ORAL | Status: DC
Start: 2014-12-02 — End: 2015-05-05

## 2014-12-02 MED ORDER — IBUPROFEN 400 MG PO TABS: 400.0000 mg | ORAL_TABLET | Freq: Two times a day (BID) | ORAL | Status: DC

## 2014-12-02 MED ORDER — PREDNISONE 10 MG PO TABS: ORAL_TABLET | ORAL | Status: AC

## 2014-12-02 MED ORDER — QUINAPRIL HCL 20 MG PO TABS: 20.0000 mg | ORAL_TABLET | Freq: Every day | ORAL | Status: DC

## 2014-12-02 MED ORDER — METOPROLOL SUCCINATE ER 25 MG PO TB24
25.0000 mg | ORAL_TABLET | Freq: Every day | ORAL | Status: DC
Start: 2014-12-02 — End: 2015-05-05

## 2014-12-02 MED ORDER — FLUOCINONIDE 0.1 % EX CREA: TOPICAL_CREAM | CUTANEOUS | Status: DC

## 2014-12-02 MED ORDER — MONTELUKAST SODIUM 10 MG PO TABS: 10.0000 mg | ORAL_TABLET | ORAL | Status: AC | PRN

## 2014-12-02 MED ORDER — VERAPAMIL HCL ER 240 MG PO CP24: 240.0000 mg | ORAL_CAPSULE | Freq: Every day | ORAL | Status: DC

## 2014-12-02 NOTE — Addendum Note (Signed)
Addended by: Malva Limes on: 12/02/2014 01:34 PM   Modules accepted: Kipp Brood

## 2014-12-02 NOTE — Progress Notes (Addendum)
Patient: Calvin Carpenter Male    DOB: 28-Oct-1930   81 y.o.   MRN: [id number] Visit Date: 12/02/2014  Today's Provider: Mila Merry, MD   Chief Complaint  Patient presents with  . Shoulder Pain    x 3-4 weeks   Subjective:    Shoulder Pain  The pain is present in the right shoulder (pain radiates down from his right shoulder to his fingers). This is a recurrent problem. Episode onset: 3-4 weeks. There has been a history of trauma (patient fequently hits his elbow against objects). The problem has been gradually worsening. Associated symptoms include joint locking (thumb on right hand) and stiffness. Pertinent negatives include no fever, joint swelling, limited range of motion, numbness or tingling. He has tried NSAIDS for the symptoms. The treatment provided moderate relief.   Nasal congestion  He states he has had stuffy nose with clear sinus drainage for a couple of weeks. He is using Fluticasone, but it doesn't seem to be helping. He has had no fever or chills, and no sinus or ear pain or pressure.   He also requests a printed prescription for all of his medications.     Allergies  Allergen Reactions  . Benazepril Hcl   . Mevacor  [Lovastatin]   . Fluvastatin Sodium Rash   Previous Medications   ALBUTEROL (VENTOLIN HFA) 108 (90 BASE) MCG/ACT INHALER    Inhale 2 puffs into the lungs every 6 (six) hours.   AMLODIPINE (NORVASC) 5 MG TABLET    Take 5 mg by mouth daily.   ASPIRIN 81 MG TABLET    Take 81 mg by mouth daily.   FLUOCINONIDE 0.1 % CREA       FLUTICASONE (FLONASE) 50 MCG/ACT NASAL SPRAY    Place 1 spray into both nostrils as needed for allergies or rhinitis.   FUROSEMIDE (LASIX) 20 MG TABLET       IBUPROFEN (ADVIL,MOTRIN) 400 MG TABLET    Take 1 tablet by mouth 2 (two) times daily.   IPRATROPIUM (ATROVENT) 0.02 % NEBULIZER SOLUTION    Inhale into the lungs.   METOPROLOL SUCCINATE (TOPROL-XL) 25 MG 24 HR TABLET    Take 25 mg by mouth daily.   MONTELUKAST  (SINGULAIR) 10 MG TABLET    as needed.    QUINAPRIL (ACCUPRIL) 20 MG TABLET    Take 20 mg by mouth daily.   SIMVASTATIN (ZOCOR) 40 MG TABLET    Take 40 mg by mouth daily.   TRIAMCINOLONE CREAM (KENALOG) 0.1 %    Apply 1-2 application topically daily as needed.   VERAPAMIL (VERELAN PM) 240 MG 24 HR CAPSULE    Take 240 mg by mouth daily.    Review of Systems  Constitutional: Negative for fever.  Musculoskeletal: Positive for arthralgias and stiffness.  Neurological: Negative for tingling and numbness.    History  Substance Use Topics  . Smoking status: Former Smoker -- 1.00 packs/day for 65 years    Types: Cigarettes    Quit date: 04/26/2011  . Smokeless tobacco: Never Used  . Alcohol Use: No   Objective:   BP 126/56 mmHg  Pulse 59  Temp(Src) 97.9 F (36.6 C) (Oral)  Resp 16  Wt 165 lb (74.844 kg)  SpO2 93%  Physical Exam   General Appearance:    Alert, cooperative, no distress  ENT:   Nasal congestion, pale boggy turbinates. Clear discharge. No sinus tenderness. OP/NP pink and clear  Eyes:    PERRL, conjunctiva/corneas  clear, EOM's intact       Lungs:     Clear to auscultation bilaterally, respirations unlabored  Heart:    Regular rate and rhythm  Neurologic:   Awake, alert, oriented x 3. No apparent focal neurological           defect.   MS:    Tender right lateral epicondyle. Tender along right superior trapezius. No swelling. FROM. No erythema.        Assessment & Plan:     1. Lateral epicondylitis (tennis elbow), right  - predniSONE (DELTASONE) 10 MG tablet; 6 tablets for 2 days, then 5 for 2 days, then 4 for 2 days, then 3 for 2 days, then 2 for 2 days, then 1 for 2 days.  Dispense: 42 tablet; Refill: 0  2. Shoulder strain, right, initial encounter  - predniSONE (DELTASONE) 10 MG tablet; 6 tablets for 2 days, then 5 for 2 days, then 4 for 2 days, then 3 for 2 days, then 2 for 2 days, then 1 for 2 days.  Dispense: 42 tablet; Refill: 0  3. Nasal congestion  -  predniSONE (DELTASONE) 10 MG tablet; 6 tablets for 2 days, then 5 for 2 days, then 4 for 2 days, then 3 for 2 days, then 2 for 2 days, then 1 for 2 days.  Dispense: 42 tablet; Refill: 0  Patient given printed prescriptions for all of his chronic meds today Advised he is overdue to yearly CPE and to schedule within the next month or two.      Mila Merry, MD  [facility]

## 2014-12-21 IMAGING — US ULTRASOUND AORTA
1 series · 14 of 14 positions shown · non-contrast
Comparison: none

REASON FOR EXAM: Carotid artery disease and known AAA
COMMENTS:

[Series 1: ultrasound aorta · 0.31mm/px · 14 of 14 slices shown]
[im 1/14]
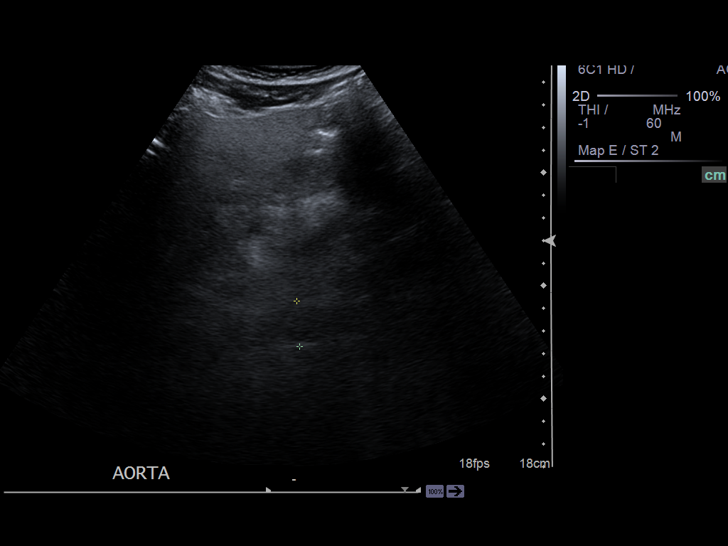
[im 2/14]
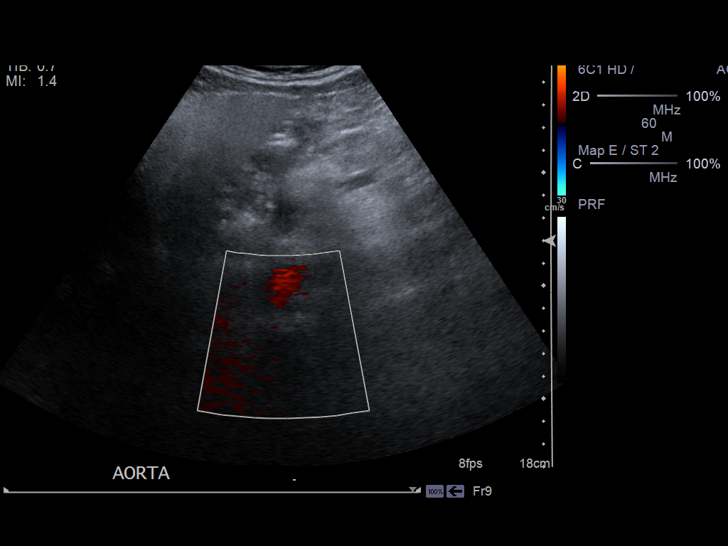
[im 3/14]
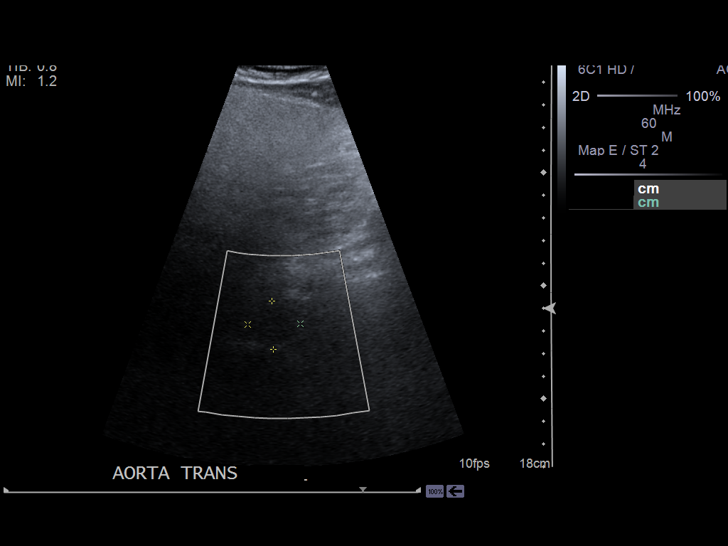
[im 4/14]
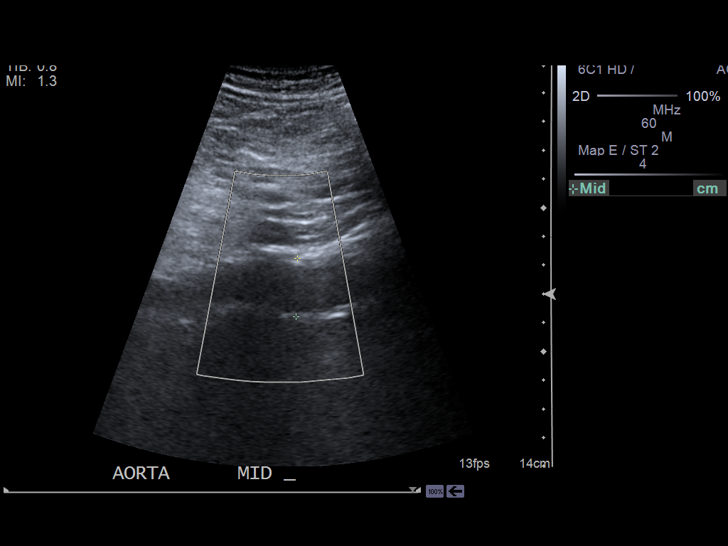
[im 5/14]
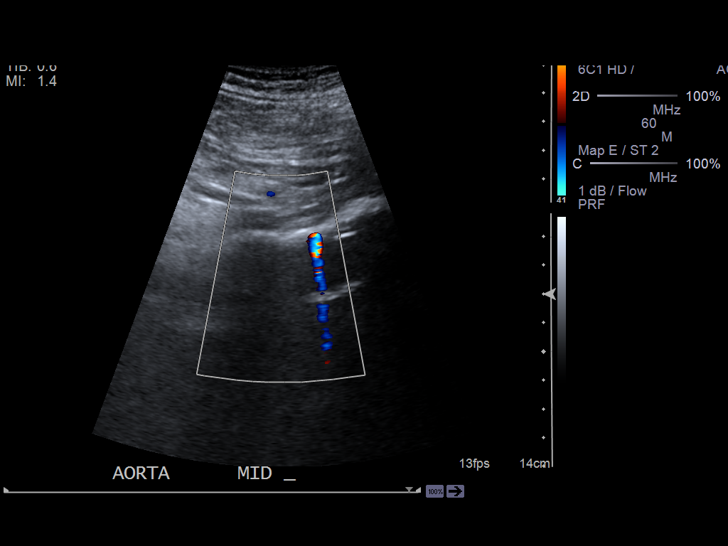
[im 6/14]
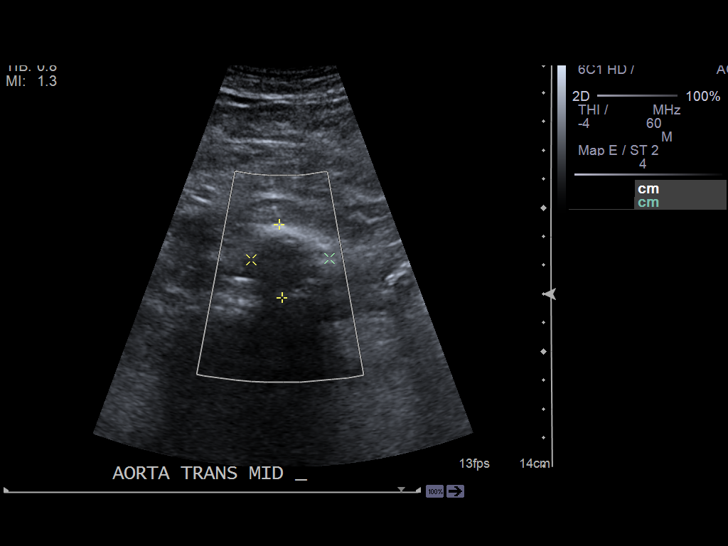
[im 7/14]
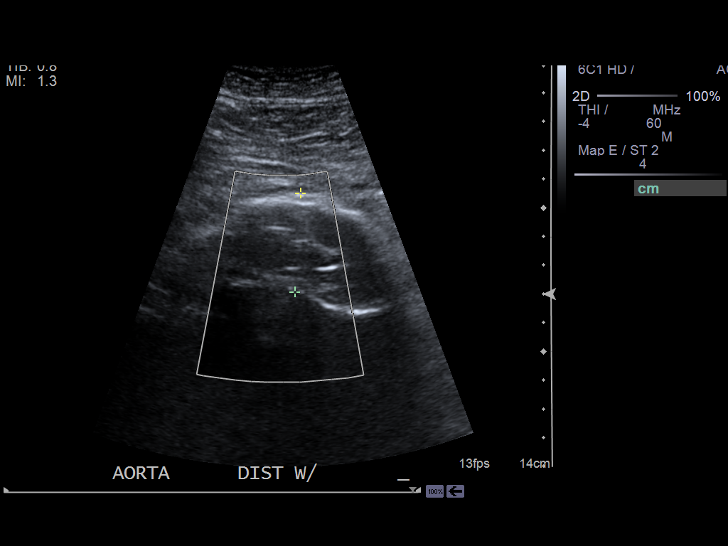
[im 8/14]
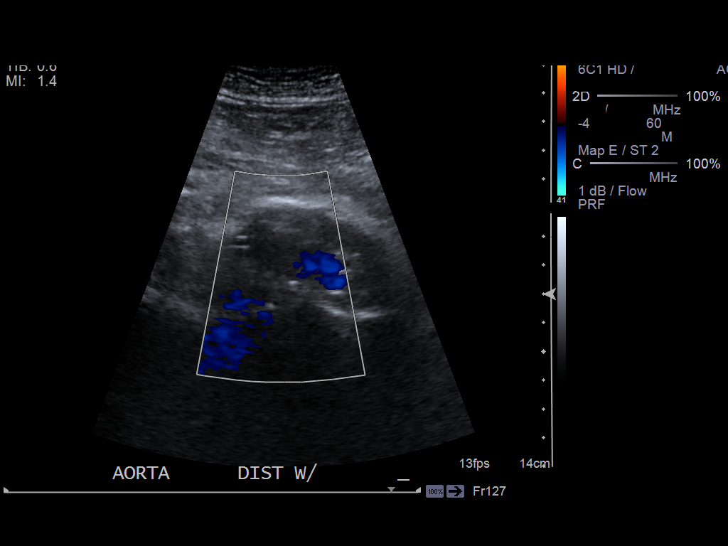
[im 9/14]
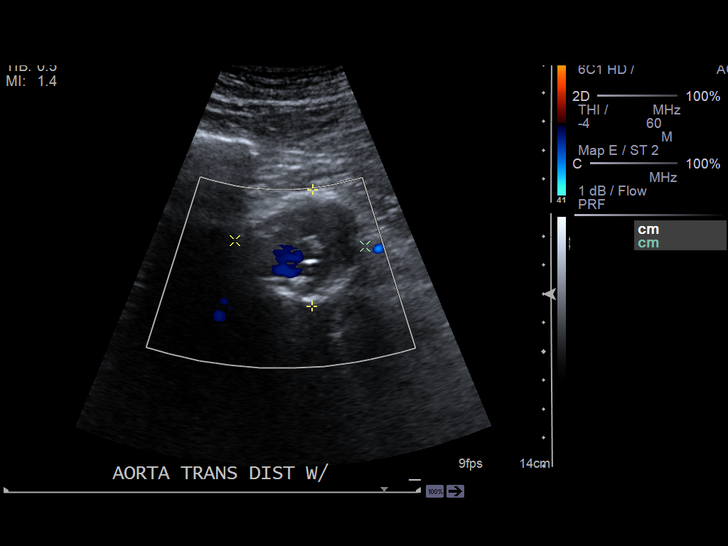
[im 10/14]
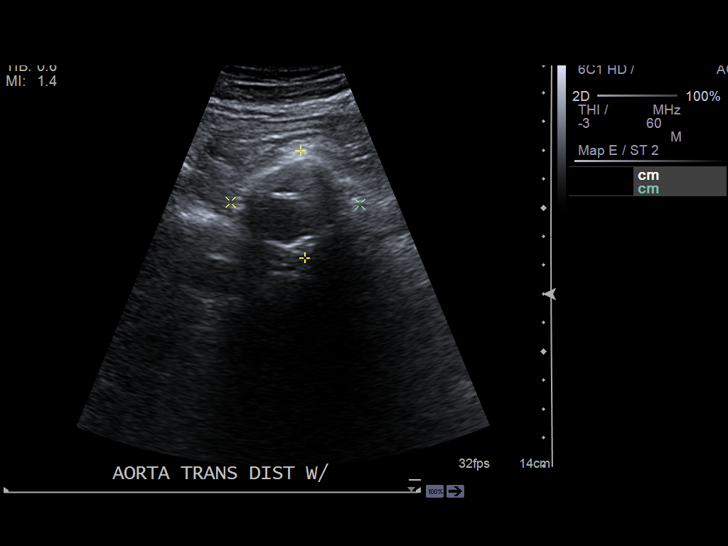
[im 11/14]
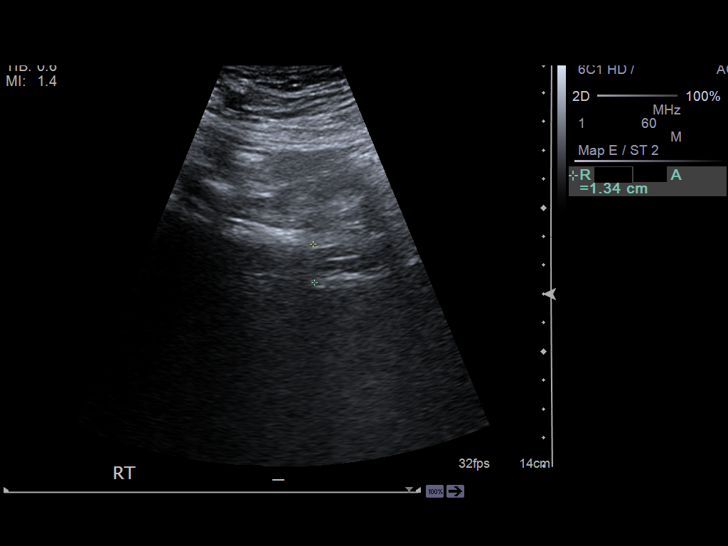
[im 12/14]
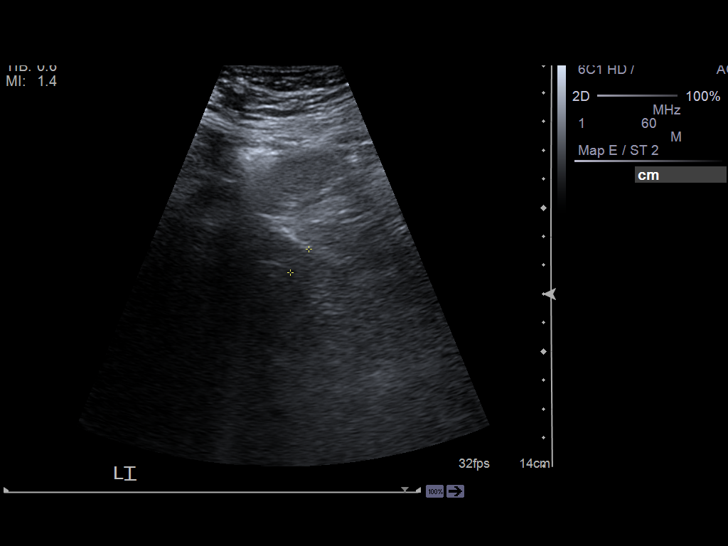
[im 13/14]
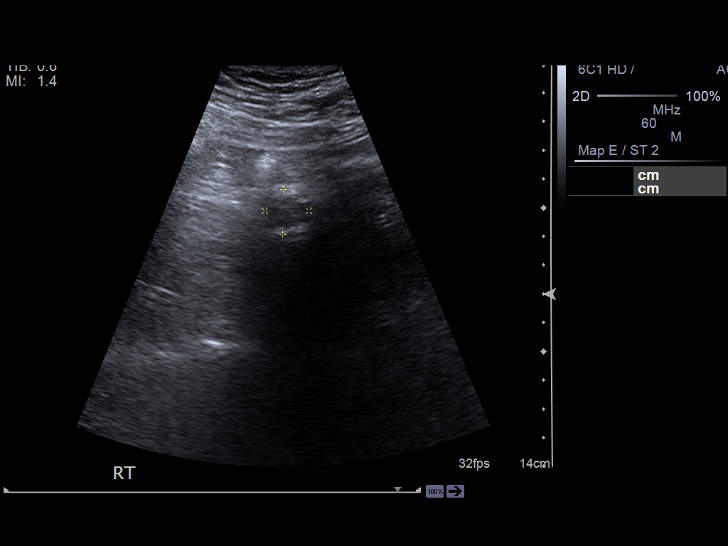
[im 14/14]
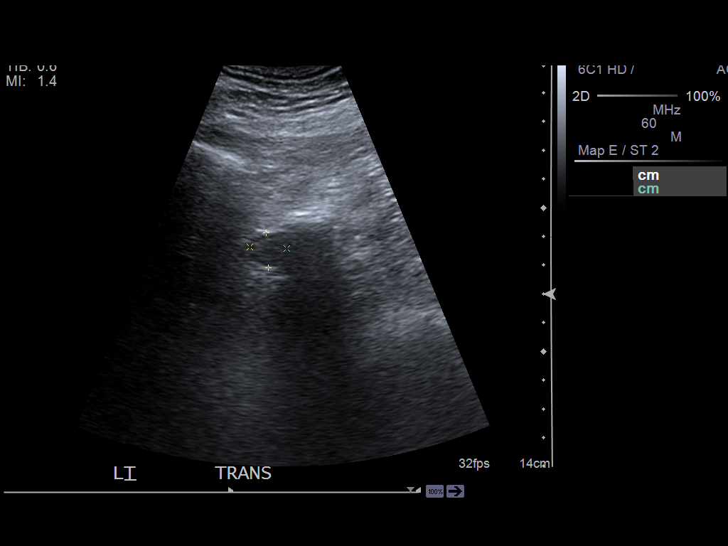

[14 of 14 positions shown; findings below may reference images not displayed]

PROCEDURE:     US  - US AORTA  - December 18, 2012 [DATE]

RESULT:     Grayscale and color Doppler techniques were employed to evaluate
the abdominal aorta.

The upper abdominal aorta exhibits normal diameter measuring 2.1 cm AP x
cm transversely. In the mid abdominal aorta dilation of the caliber is seen
with maximal AP dimension of 2.6 cm and maximal transverse dimension of
cm. The infrarenal abdominal aorta exhibits aneurysmal dilation with maximal
diameter of 4.1 cm AP x 4.5 cm transversely. The right and left common iliac
arteries exhibit maximal diameters of approximately 1.5 cm. There is mural
calcification throughout the abdominal aorta. A stent graft has been placed
by history but is not well demonstrated on this study.
IMPRESSION: There is an infrarenal abdominal aortic aneurysm with
maximal measured diameter in the AP plane of 4.1 cm and in the transverse
plane of 4.5 cm.

[REDACTED]

## 2015-01-29 ENCOUNTER — Encounter: Payer: Self-pay | Admitting: Family Medicine

## 2015-01-29 ENCOUNTER — Ambulatory Visit (INDEPENDENT_AMBULATORY_CARE_PROVIDER_SITE_OTHER): Payer: PPO | Admitting: Family Medicine

## 2015-01-29 VITALS — BP 122/60 | HR 72 | Temp 98.8°F | Resp 16 | Ht 66.75 in | Wt 163.0 lb

## 2015-01-29 DIAGNOSIS — I739 Peripheral vascular disease, unspecified: Secondary | ICD-10-CM

## 2015-01-29 DIAGNOSIS — I482 Chronic atrial fibrillation: Secondary | ICD-10-CM | POA: Diagnosis not present

## 2015-01-29 DIAGNOSIS — Z23 Encounter for immunization: Secondary | ICD-10-CM | POA: Diagnosis not present

## 2015-01-29 DIAGNOSIS — I779 Disorder of arteries and arterioles, unspecified: Secondary | ICD-10-CM

## 2015-01-29 DIAGNOSIS — Z87891 Personal history of nicotine dependence: Secondary | ICD-10-CM | POA: Diagnosis not present

## 2015-01-29 DIAGNOSIS — Z0000 Encounter for general adult medical examination without abnormal findings: Secondary | ICD-10-CM | POA: Diagnosis not present

## 2015-01-29 DIAGNOSIS — J449 Chronic obstructive pulmonary disease, unspecified: Secondary | ICD-10-CM

## 2015-01-29 DIAGNOSIS — I10 Essential (primary) hypertension: Secondary | ICD-10-CM

## 2015-01-29 DIAGNOSIS — I714 Abdominal aortic aneurysm, without rupture, unspecified: Secondary | ICD-10-CM

## 2015-01-29 DIAGNOSIS — I5022 Chronic systolic (congestive) heart failure: Secondary | ICD-10-CM | POA: Diagnosis not present

## 2015-01-29 DIAGNOSIS — E785 Hyperlipidemia, unspecified: Secondary | ICD-10-CM

## 2015-01-29 DIAGNOSIS — Z8679 Personal history of other diseases of the circulatory system: Secondary | ICD-10-CM

## 2015-01-29 NOTE — Progress Notes (Signed)
Patient: Calvin Carpenter, Male    DOB: Jan 06, 1931, 81 y.o.   MRN: [id number] Visit Date: 01/29/2015  Today's Provider: Mila Merry, MD   Chief Complaint  Patient presents with  . Annual Exam  . Hypertension  . Hyperlipidemia   Subjective:    Annual physical  Calvin Carpenter is a 81 y.o. male. He feels well. He reports exercising yes. He reports he is sleeping poorly.  -----------------------------------------------------------      Hypertension, follow-up:  BP Readings from Last 3 Encounters:  01/29/15 122/60  12/02/14 126/56  10/24/14 120/47    He was last seen for hypertension 9 months ago.  BP at that visit was 122/60. Management since that visit includes none .He reports good compliance with treatment. He is not having side effects. none  He is exercising. He is adherent to low salt diet.   Outside blood pressures are 128/70. He is experiencing none.  Patient denies none.   Cardiovascular risk factors include none.  Use of agents associated with hypertension: none.   ------------------------------------------------------------------------    Lipid/Cholesterol, Follow-up:   Last seen for this 9 months ago.  Management since that visit includes none.  Last Lipid Panel:    Component Value Date/Time   CHOL 160 11/01/2013   TRIG 280* 11/01/2013   HDL 43 11/01/2013   LDLCALC 61 11/01/2013    He reports good compliance with treatment. He is not having side effects. none  Wt Readings from Last 3 Encounters:  01/29/15 163 lb (73.936 kg)  12/02/14 165 lb (74.844 kg)  10/24/14 165 lb (74.844 kg)    ---------------------------------------------------------------------  Carotid artery disease S/p right CEA. Last doppler ultrasound was 12/05/13 . 50-69% left & Right ICA. Denies any episdoes of numbness, tingling, weakness of extremities, or speech difficulties.    AAA: 05/22/14 3.7cm Abd aorta. Stable from previous exam. Has had no abdominal  pain or swelling.     Review of Systems  Constitutional: Negative.   HENT: Negative.   Eyes: Negative.   Respiratory: Negative.   Cardiovascular: Positive for leg swelling. Negative for palpitations.  Gastrointestinal: Negative.   Endocrine: Negative.   Genitourinary: Negative.   Musculoskeletal:       Left leg pain, worse at night  Skin: Negative.   Allergic/Immunologic: Negative.   Neurological: Negative.   Hematological: Negative.   Psychiatric/Behavioral: Negative.     Social History   Social History  . Marital Status: Married    Spouse Name: N/A  . Number of Children: N/A  . Years of Education: N/A   Occupational History  . Retired    Social History Main Topics  . Smoking status: Former Smoker -- 1.00 packs/day for 65 years    Types: Cigarettes    Quit date: 04/26/2011  . Smokeless tobacco: Never Used  . Alcohol Use: No  . Drug Use: No  . Sexual Activity: Not on file   Other Topics Concern  . Not on file   Social History Narrative    Patient Active Problem List   Diagnosis Date Noted  . Aortic valve disorder 12/02/2014  . Carotid arterial disease (HCC) 12/02/2014  . Insomnia 12/02/2014  . Peripheral vascular disease (HCC) 12/02/2014  . Shoulder strain 12/02/2014  . COPD (chronic obstructive pulmonary disease) (HCC) 12/02/2014  . Chronic diastolic heart failure (HCC) 10/24/2014  . Cardiomyopathy, ischemic 08/28/2014  . A-fib (HCC) 08/28/2014  . Myocardial ischemia 08/28/2014  . Anemia 05/16/2013  . Rotator cuff syndrome 10/28/2009  .  CHF (congestive heart failure) (HCC) 06/26/2008  . Arteriosclerosis of coronary artery 10/27/2005  . Hemorrhoids without complication 03/21/2005  . Allergic rhinitis 10/29/2001  . AAA (abdominal aortic aneurysm) (HCC) 08/16/1996  . CAFL (chronic airflow limitation) (HCC) 12/09/1994  . GERD (gastroesophageal reflux disease) 05/23/1994  . HLD (hyperlipidemia) 05/23/1994  . Essential (primary) hypertension 05/23/1994   . Tobacco use 05/23/1994    Past Surgical History  Procedure Laterality Date  . Groin surgery  2011  . Carotid endarterectomy Right 2007    [facility]  . Vascular surgery Bilateral     Aortic aneurysm stent, right femoral-left femoral bypass  . Hernia repair Left 10-29-12 and 04-23-13    inguinal  . Femoral bypass  09/15/2010    Fem-Fem Bypass; Dr. Earnestine Leys, [facility]. Bilateral with femoral endartectomies. Excised graft June 2012 dur to infection  . Abdominal aortic aneurysm repair  2009  . Cataract extraction      Right eye 2001; Left eye 2000  . Prostate surgery  1993  . Kidney stone extraction  1980  . Cardiac catheterization  10/27/2005    Severe 2 vessel CAD with occluded prox RCA with collaterals. Lx occlusion. 95% OMI, 30% Mid LAD. Mild aortic stenosis  . Doppler echocardiography  09/08/2011    Mild global LV dysfunction EF=45%, MILD lvh, mild MI, TI. Severe AS    His family history includes Diabetes in his brother and mother; Lung cancer in his father; Ovarian cancer in his mother.    Previous Medications   ALBUTEROL (VENTOLIN HFA) 108 (90 BASE) MCG/ACT INHALER    Inhale 2 puffs into the lungs every 6 (six) hours.   AMLODIPINE (NORVASC) 5 MG TABLET    Take 5 mg by mouth daily.   ASPIRIN 81 MG TABLET    Take 81 mg by mouth daily.   FLUOCINONIDE 0.1 % CREA    Apply daily as needed   FLUTICASONE (FLONASE) 50 MCG/ACT NASAL SPRAY    Place 1 spray into both nostrils as needed for allergies or rhinitis.   FUROSEMIDE (LASIX) 20 MG TABLET       IBUPROFEN (ADVIL,MOTRIN) 400 MG TABLET    Take 1 tablet (400 mg total) by mouth 2 (two) times daily.   IPRATROPIUM (ATROVENT) 0.02 % NEBULIZER SOLUTION    Inhale into the lungs.   METOPROLOL SUCCINATE (TOPROL-XL) 25 MG 24 HR TABLET    Take 1 tablet (25 mg total) by mouth daily.   MONTELUKAST (SINGULAIR) 10 MG TABLET    Take 1 tablet (10 mg total) by mouth as needed.   QUINAPRIL (ACCUPRIL) 20 MG TABLET    Take 1 tablet (20 mg total) by mouth daily.    SIMVASTATIN (ZOCOR) 40 MG TABLET    Take 1 tablet (40 mg total) by mouth daily.   VERAPAMIL (VERELAN PM) 240 MG 24 HR CAPSULE    Take 1 capsule (240 mg total) by mouth daily.    Patient Care Team: Malva Limes, MD as PCP - General (Family Medicine) [name], MD (General Surgery) Delma Freeze, FNP as Nurse Practitioner (Family Medicine) Marcina Millard, MD as Consulting Physician (Cardiology)     Objective:   Vitals: BP 122/60 mmHg  Pulse 72  Temp(Src) 98.8 F (37.1 C) (Oral)  Resp 16  Ht 5' 6.75" (1.695 m)  Wt 163 lb (73.936 kg)  BMI 25.73 kg/m2  SpO2 97%  Physical Exam   General Appearance:    Alert, cooperative, no distress, appears stated age  Head:  Normocephalic, without obvious abnormality, atraumatic  Eyes:    PERRL, conjunctiva/corneas clear, EOM's intact, fundi    benign, both eyes       Ears:    Normal TM's and external ear canals, both ears  Nose:   Nares normal, septum midline, mucosa normal, no drainage   or sinus tenderness  Throat:   Lips, mucosa, and tongue normal; teeth and gums normal  Neck:   Supple, symmetrical, trachea midline, no adenopathy;       thyroid:  No enlargement/tenderness/nodules; faint bilateral caroti bruit. Well healed surgical scar fom right CEA  Back:     Symmetric, no curvature, ROM normal, no CVA tenderness  Lungs:     Clear to auscultation bilaterally, respirations unlabored  Chest wall:    No tenderness or deformity  Heart:    Regular rate and rhythm, III/VI systolic murmur RUSB and LUSB.   Abdomen:     Soft, non-tender, bowel sounds active all four quadrants,    no masses, no organomegaly  Genitalia:    deferred  Rectal:    deferred  Extremities:   Extremities normal, atraumatic, no cyanosis or edema  Pulses:   2+ and symmetric all extremities  Skin:   Skin color, texture, turgor normal, no rashes or lesions  Lymph nodes:   Cervical, supraclavicular, and axillary nodes normal  Neurologic:   CNII-XII intact.  Normal strength, sensation and reflexes      throughout     Activities of Daily Living In your present state of health, do you have any difficulty performing the following activities: 01/29/2015 10/24/2014  Hearing? Y N  Vision? Y N  Difficulty concentrating or making decisions? N Y  Walking or climbing stairs? N N  Dressing or bathing? N N  Doing errands, shopping? N N    Fall Risk Assessment Fall Risk  01/29/2015 10/24/2014 08/28/2014  Falls in the past year? No No No     Depression Screen PHQ 2/9 Scores 01/29/2015 10/24/2014 08/28/2014  PHQ - 2 Score 0 0 0  PHQ- 9 Score 2 - -    Cognitive Testing - 6-CIT  Correct? Score   What year is it? yes 0 0 or 4  What month is it? yes 0 0 or 3  Memorize:    Floyde Parkins,  44,  [address]  [place],      What time is it? (within 1 hour) yes 0 0 or 3  Count backwards from 20 yes 0 0, 2, or 4  Name the months of the year yes 0 0, 2, or 4  Repeat name & address above yes 6 0, 2, 4, 6, 8, or 10       TOTAL SCORE  6/28   Interpretation:  Normal  Normal (0-7) Abnormal (8-28)   Audit-C Alcohol Use Screening  Question Answer Points  How often do you have alcoholic drink? never 0  On days you do drink alcohol, how many drinks do you typically consume? never 0  How oftey will you drink 6 or more in a total? never 0  Total Score:  0   A score of 3 or more in women, and 4 or more in men indicates increased risk for alcohol abuse, EXCEPT if all of the points are from question 1.     Assessment & Plan:     Annual Wellness Visit  Reviewed patient's Family Medical History Reviewed and updated list of patient's medical providers Assessment of cognitive impairment was done Assessed  patient's functional ability Established a written schedule for health screening services Health Risk Assessent Completed and Reviewed  Exercise Activities and Dietary recommendations Goals    None      Immunization History  Administered Date(s) Administered    . Influenza-Unspecified 02/23/2013  . Pneumococcal Conjugate-13 10/31/2013  . Pneumococcal Polysaccharide-23 02/28/2000  . Tdap 01/08/2011  . Zoster 01/24/2008    Health Maintenance  Topic Date Due  . INFLUENZA VACCINE  11/24/2014  . TETANUS/TDAP  01/07/2021  . ZOSTAVAX  Completed  . PNA vac Low Risk Adult  Completed      Discussed health benefits of physical activity, and encouraged him to engage in regular exercise appropriate for his age and condition.    ------------------------------------------------------------------------------------------------------------ 1. Annual physical exam Generally doing well.   2. History atrial fibrillation (HCC) Transient following inguinal hernia surgery. Regular rhythm today. Continue regular follow up with cardiology. Currently asymptomatic on ASA  3. Abdominal aortic aneurysm (AAA) without rupture (HCC) Stable when last check January 2016  4. Bilateral carotid artery disease (HCC) Asymptomatic. Compliant with medication.  Continue aggressive risk factor modification.   - US Carotid Duplex Bilateral; Future  5. Chronic systolic congestive heart failure (HCC) Continue routine follow up cardiology.   6. Chronic obstructive pulmonary disease, unspecified COPD type (HCC) Doing well since stopping smoking. Rarely requiring rescue inhalers.   7. Essential (primary) hypertension Well controlled.  Continue current medications.   - Renal function panel - Magnesium  8. HLD (hyperlipidemia) He is tolerating simvastatin well with no adverse effects.   - Lipid panel  9. Former smoker   10. Need for influenza vaccination  - Flu vaccine HIGH DOSE PF

## 2015-01-29 NOTE — Patient Instructions (Signed)
   Please contact your eyecare professional to schedule a routine eye exam  

## 2015-01-30 ENCOUNTER — Encounter: Payer: Self-pay | Admitting: Family Medicine

## 2015-01-30 DIAGNOSIS — R739 Hyperglycemia, unspecified: Secondary | ICD-10-CM | POA: Insufficient documentation

## 2015-01-30 LAB — LIPID PANEL
CHOLESTEROL TOTAL: 155 mg/dL (ref 100–199)
Chol/HDL Ratio: 3.1 ratio units (ref 0.0–5.0)
HDL: 50 mg/dL (ref >39)
LDL Calculated: 69 mg/dL (ref 0–99)
Triglycerides: 178 mg/dL — ABNORMAL HIGH (ref 0–149)
VLDL Cholesterol Cal: 36 mg/dL (ref 5–40)

## 2015-01-30 LAB — RENAL FUNCTION PANEL
ALBUMIN: 4.7 g/dL (ref 3.5–4.7)
BUN / CREAT RATIO: 13 (ref 10–22)
BUN: 13 mg/dL (ref 8–27)
CALCIUM: 10.2 mg/dL (ref 8.6–10.2)
CHLORIDE: 101 mmol/L (ref 97–108)
CO2: 24 mmol/L (ref 18–29)
Creatinine, Ser: 1.04 mg/dL (ref 0.76–1.27)
GFR calc Af Amer: 77 mL/min/{1.73_m2} (ref >59)
GFR calc non Af Amer: 67 mL/min/{1.73_m2} (ref >59)
Glucose: 113 mg/dL — ABNORMAL HIGH (ref 65–99)
Phosphorus: 4.2 mg/dL (ref 2.5–4.5)
Potassium: 5.1 mmol/L (ref 3.5–5.2)
Sodium: 142 mmol/L (ref 134–144)

## 2015-01-30 LAB — MAGNESIUM: Magnesium: 2.5 mg/dL — ABNORMAL HIGH (ref 1.6–2.3)

## 2015-02-04 ENCOUNTER — Telehealth: Payer: Self-pay | Admitting: Family Medicine

## 2015-02-04 ENCOUNTER — Ambulatory Visit
Admission: RE | Admit: 2015-02-04 | Discharge: 2015-02-04 | Disposition: A | Discharge status: Home / Self Care | Payer: PPO | Source: Ambulatory Visit | Attending: Family Medicine | Admitting: Family Medicine

## 2015-02-04 DIAGNOSIS — I779 Disorder of arteries and arterioles, unspecified: Secondary | ICD-10-CM | POA: Diagnosis present

## 2015-02-04 DIAGNOSIS — I739 Peripheral vascular disease, unspecified: Secondary | ICD-10-CM

## 2015-02-04 DIAGNOSIS — I6523 Occlusion and stenosis of bilateral carotid arteries: Secondary | ICD-10-CM | POA: Insufficient documentation

## 2015-02-04 NOTE — Telephone Encounter (Signed)
Pt stated that he had cancelled the US for the Carotid Artery on the left side yesterday due to them charging him $75.00 dollars. He wants to know if it can be rescheduled, he stated that he was talking to the health insurance and that he was going to call back.  Thanks,

## 2015-02-04 NOTE — Telephone Encounter (Signed)
Pt stated he needs a nurse to return his call to discuss the ultrasound that he had to cancel. Thanks TNP

## 2015-02-05 NOTE — Telephone Encounter (Signed)
Spoke with pt who states that he has already rescheduled his appointment

## 2015-02-11 ENCOUNTER — Emergency Department
Admission: EM | Admit: 2015-02-11 | Discharge: 2015-02-12 | Disposition: A | Discharge status: Home / Self Care | Payer: PPO | Attending: Emergency Medicine | Admitting: Emergency Medicine

## 2015-02-11 ENCOUNTER — Emergency Department: Payer: PPO

## 2015-02-11 DIAGNOSIS — Z7982 Long term (current) use of aspirin: Secondary | ICD-10-CM | POA: Insufficient documentation

## 2015-02-11 DIAGNOSIS — I10 Essential (primary) hypertension: Secondary | ICD-10-CM | POA: Insufficient documentation

## 2015-02-11 DIAGNOSIS — N390 Urinary tract infection, site not specified: Secondary | ICD-10-CM | POA: Insufficient documentation

## 2015-02-11 DIAGNOSIS — Z87891 Personal history of nicotine dependence: Secondary | ICD-10-CM | POA: Insufficient documentation

## 2015-02-11 DIAGNOSIS — Z79899 Other long term (current) drug therapy: Secondary | ICD-10-CM | POA: Insufficient documentation

## 2015-02-11 DIAGNOSIS — J441 Chronic obstructive pulmonary disease with (acute) exacerbation: Secondary | ICD-10-CM | POA: Insufficient documentation

## 2015-02-11 DIAGNOSIS — Z7951 Long term (current) use of inhaled steroids: Secondary | ICD-10-CM | POA: Insufficient documentation

## 2015-02-11 DIAGNOSIS — J810 Acute pulmonary edema: Secondary | ICD-10-CM

## 2015-02-11 DIAGNOSIS — R109 Unspecified abdominal pain: Secondary | ICD-10-CM

## 2015-02-11 DIAGNOSIS — Z791 Long term (current) use of non-steroidal anti-inflammatories (NSAID): Secondary | ICD-10-CM | POA: Insufficient documentation

## 2015-02-11 DIAGNOSIS — R0602 Shortness of breath: Secondary | ICD-10-CM | POA: Diagnosis present

## 2015-02-11 LAB — URINALYSIS COMPLETE WITH MICROSCOPIC (ARMC ONLY)
BILIRUBIN URINE: NEGATIVE
Bacteria, UA: NONE SEEN
Glucose, UA: NEGATIVE mg/dL
KETONES UR: NEGATIVE mg/dL
NITRITE: NEGATIVE
Protein, ur: NEGATIVE mg/dL
SPECIFIC GRAVITY, URINE: 1.006 (ref 1.005–1.030)
pH: 5 (ref 5.0–8.0)

## 2015-02-11 LAB — COMPREHENSIVE METABOLIC PANEL
ALT: 21 U/L (ref 17–63)
AST: 26 U/L (ref 15–41)
Albumin: 4.4 g/dL (ref 3.5–5.0)
Alkaline Phosphatase: 41 U/L (ref 38–126)
Anion gap: 8 (ref 5–15)
BUN: 12 mg/dL (ref 6–20)
CALCIUM: 9.4 mg/dL (ref 8.9–10.3)
CHLORIDE: 104 mmol/L (ref 101–111)
CO2: 26 mmol/L (ref 22–32)
Creatinine, Ser: 1.01 mg/dL (ref 0.61–1.24)
GFR calc Af Amer: >60 mL/min (ref >60)
GFR calc non Af Amer: >60 mL/min (ref >60)
Glucose, Bld: 136 mg/dL — ABNORMAL HIGH (ref 65–99)
Potassium: 3.7 mmol/L (ref 3.5–5.1)
SODIUM: 138 mmol/L (ref 135–145)
TOTAL PROTEIN: 7.7 g/dL (ref 6.5–8.1)
Total Bilirubin: 1 mg/dL (ref 0.3–1.2)

## 2015-02-11 LAB — CBC WITH DIFFERENTIAL/PLATELET
BASOS ABS: 0.1 10*3/uL (ref 0–0.1)
BASOS PCT: 1 %
Eosinophils Absolute: 0.2 10*3/uL (ref 0–0.7)
Eosinophils Relative: 1 %
HEMATOCRIT: 41.3 % (ref 40.0–52.0)
Hemoglobin: 13.5 g/dL (ref 13.0–18.0)
LYMPHS PCT: 25 %
Lymphs Abs: 4.1 10*3/uL — ABNORMAL HIGH (ref 1.0–3.6)
MCH: 31.3 pg (ref 26.0–34.0)
MCHC: 32.7 g/dL (ref 32.0–36.0)
MCV: 95.7 fL (ref 80.0–100.0)
MONO ABS: 1.7 10*3/uL — AB (ref 0.2–1.0)
Monocytes Relative: 10 %
NEUTROS ABS: 10.7 10*3/uL — AB (ref 1.4–6.5)
Neutrophils Relative %: 63 %
PLATELETS: 188 10*3/uL (ref 150–440)
RBC: 4.32 MIL/uL — AB (ref 4.40–5.90)
RDW: 14.1 % (ref 11.5–14.5)
WBC: 16.7 10*3/uL — AB (ref 3.8–10.6)

## 2015-02-11 LAB — TROPONIN I: TROPONIN I: 0.04 ng/mL — AB (ref <0.031)

## 2015-02-11 LAB — BRAIN NATRIURETIC PEPTIDE: B Natriuretic Peptide: 425 pg/mL — ABNORMAL HIGH (ref 0.0–100.0)

## 2015-02-11 LAB — LIPASE, BLOOD: Lipase: 22 U/L (ref 22–51)

## 2015-02-11 MED ORDER — FUROSEMIDE 10 MG/ML IJ SOLN
40.0000 mg | Freq: Once | INTRAMUSCULAR | Status: AC
  Administered 2015-02-11: 40 mg via INTRAVENOUS
  Filled 2015-02-11: qty 4

## 2015-02-11 MED ORDER — DEXTROSE 5 % IV SOLN
1.0000 g | Freq: Once | INTRAVENOUS | Status: AC
  Administered 2015-02-11: 1 g via INTRAVENOUS
  Filled 2015-02-11: qty 10

## 2015-02-11 NOTE — ED Provider Notes (Signed)
Time Seen: Approximately 2130  I have reviewed the triage notes  Chief Complaint: Shortness of Breath   History of Present Illness: Calvin Carpenter is a 81 y.o. male who presents with acute onset of shortness of breath. Patient states he had been out playing some today and when he returned he started having some increased shortness of breath without any chest pain. He has a history of previous pulmonary edema based on his description. He also has history of COPD. The patient states that he's also had some abdominal pain and points primarily to the periumbilical and lower abdominal area. He states interestingly enough these had similar issues before in the past when he developed the pulmonary edema. He denies any vomiting, cough, hemoptysis, or wheezing. Patient denies any back or flank pain. Patient states he has had some urinary frequency he states he also felt "" sweaty like he may have a fever. He did not take his temperature at home. He denies any testicular pain, diarrhea or constipation.  Past Medical History  Diagnosis Date  . Myocardial infarct (HCC) 04-23-13  . History of chicken pox   . History of measles   . History of mumps   . CHF (congestive heart failure) (HCC)   . C. difficile colitis 12/02/2014    following prolonged antibiotic treatment for post op pneumonia     Patient Active Problem List   Diagnosis Date Noted  . Hyperglycemia 01/30/2015  . Aortic valve disorder 12/02/2014  . Carotid arterial disease (HCC) 12/02/2014  . Insomnia 12/02/2014  . Peripheral vascular disease (HCC) 12/02/2014  . Shoulder strain 12/02/2014  . COPD (chronic obstructive pulmonary disease) (HCC) 12/02/2014  . Chronic diastolic heart failure (HCC) 10/24/2014  . Cardiomyopathy, ischemic 08/28/2014  . A-fib (HCC) 08/28/2014  . Myocardial ischemia 08/28/2014  . Anemia 05/16/2013  . Rotator cuff syndrome 10/28/2009  . CHF (congestive heart failure) (HCC) 06/26/2008  . Arteriosclerosis of  coronary artery 10/27/2005  . Hemorrhoids without complication 03/21/2005  . Allergic rhinitis 10/29/2001  . AAA (abdominal aortic aneurysm) (HCC) 08/16/1996  . GERD (gastroesophageal reflux disease) 05/23/1994  . HLD (hyperlipidemia) 05/23/1994  . Essential (primary) hypertension 05/23/1994  . Former smoker 05/23/1994    Past Surgical History  Procedure Laterality Date  . Groin surgery  2011  . Carotid endarterectomy Right 2007    Sankar  . Vascular surgery Bilateral     Aortic aneurysm stent, right femoral-left femoral bypass  . Hernia repair Left 10-29-12 and 04-23-13    inguinal  . Femoral bypass  09/15/2010    Fem-Fem Bypass; Dr. Earnestine Leys, [facility]. Bilateral with femoral endartectomies. Excised graft June 2012 dur to infection  . Abdominal aortic aneurysm repair  2009  . Cataract extraction      Right eye 2001; Left eye 2000  . Prostate surgery  1993  . Kidney stone extraction  1980  . Cardiac catheterization  10/27/2005    Severe 2 vessel CAD with occluded prox RCA with collaterals. Lx occlusion. 95% OMI, 30% Mid LAD. Mild aortic stenosis  . Doppler echocardiography  09/08/2011    Mild global LV dysfunction EF=45%, MILD lvh, mild MI, TI. Severe AS    Past Surgical History  Procedure Laterality Date  . Groin surgery  2011  . Carotid endarterectomy Right 2007    Sankar  . Vascular surgery Bilateral     Aortic aneurysm stent, right femoral-left femoral bypass  . Hernia repair Left 10-29-12 and 04-23-13    inguinal  . Femoral bypass  09/15/2010    Fem-Fem Bypass; Dr. Earnestine Leys, [facility]. Bilateral with femoral endartectomies. Excised graft June 2012 dur to infection  . Abdominal aortic aneurysm repair  2009  . Cataract extraction      Right eye 2001; Left eye 2000  . Prostate surgery  1993  . Kidney stone extraction  1980  . Cardiac catheterization  10/27/2005    Severe 2 vessel CAD with occluded prox RCA with collaterals. Lx occlusion. 95% OMI, 30% Mid LAD. Mild aortic stenosis  .  Doppler echocardiography  09/08/2011    Mild global LV dysfunction EF=45%, MILD lvh, mild MI, TI. Severe AS    Current Outpatient Rx  Name  Route  Sig  Dispense  Refill  . albuterol (VENTOLIN HFA) 108 (90 BASE) MCG/ACT inhaler   Inhalation   Inhale 2 puffs into the lungs every 6 (six) hours.         Marland Kitchen amLODipine (NORVASC) 5 MG tablet   Oral   Take 5 mg by mouth daily.         Marland Kitchen aspirin 81 MG tablet   Oral   Take 81 mg by mouth daily.         . Fluocinonide 0.1 % CREA      Apply daily as needed   30 g   1   . fluticasone (FLONASE) 50 MCG/ACT nasal spray   Each Nare   Place 1 spray into both nostrils as needed for allergies or rhinitis.         . furosemide (LASIX) 20 MG tablet   Oral   Take 20 mg by mouth daily.          Marland Kitchen ipratropium-albuterol (DUONEB) 0.5-2.5 (3) MG/3ML SOLN   Nebulization   Take 3 mLs by nebulization every 6 (six) hours as needed.         . metoprolol succinate (TOPROL-XL) 25 MG 24 hr tablet   Oral   Take 1 tablet (25 mg total) by mouth daily.   90 tablet   1   . montelukast (SINGULAIR) 10 MG tablet   Oral   Take 1 tablet (10 mg total) by mouth as needed.   90 tablet   1   . quinapril (ACCUPRIL) 20 MG tablet   Oral   Take 1 tablet (20 mg total) by mouth daily.   90 tablet   1   . simvastatin (ZOCOR) 40 MG tablet   Oral   Take 1 tablet (40 mg total) by mouth daily.   90 tablet   1   . verapamil (VERELAN PM) 240 MG 24 hr capsule   Oral   Take 1 capsule (240 mg total) by mouth daily.   90 capsule   1   . ibuprofen (ADVIL,MOTRIN) 400 MG tablet   Oral   Take 1 tablet (400 mg total) by mouth 2 (two) times daily.   30 tablet   1     Allergies:  Benazepril hcl; Mevacor ; and Fluvastatin sodium  Family History: Family History  Problem Relation Age of Onset  . Diabetes Brother   . Diabetes Mother   . Ovarian cancer Mother   . Lung cancer Father     Social History: Social History  Substance Use Topics  .  Smoking status: Former Smoker -- 1.00 packs/day for 65 years    Types: Cigarettes    Quit date: 04/26/2011  . Smokeless tobacco: Never Used  . Alcohol Use: No     Review of Systems:  10 point review of systems was performed and was otherwise negative:  Constitutional: No fever Eyes: No visual disturbances ENT: No sore throat, ear pain Cardiac: No chest pain Respiratory: No shortness of breath, wheezing, or stridor Abdomen: Abdominal pain mild periumbilical to lower middle abdominal region no vomiting, No diarrhea Endocrine: No weight loss, No night sweats Extremities: No peripheral edema, cyanosis Skin: No rashes, easy bruising Neurologic: No focal weakness, trouble with speech or swollowing Urologic: No dysuria, Hematuria, he does have urinary frequency   Physical Exam:  ED Triage Vitals  Enc Vitals Group     BP 02/11/15 2043 112/89 mmHg     Pulse Rate 02/11/15 2043 98     Resp 02/11/15 2043 20     Temp 02/11/15 2043 99.2 F (37.3 C)     Temp Source 02/11/15 2043 Oral     SpO2 02/11/15 2043 88 %     Weight 02/11/15 2043 158 lb (71.668 kg)     Height 02/11/15 2043  (1.702 m)     Head Cir --      Peak Flow --      Pain Score 02/11/15 2044 5     Pain Loc --      Pain Edu? --      Excl. in GC? --     General: Awake , Alert , and Oriented times 3; GCS 15 Head: Normal cephalic , atraumatic Eyes: Pupils equal , round, reactive to light Nose/Throat: No nasal drainage, patent upper airway without erythema or exudate.  Neck: Supple, Full range of motion, No anterior adenopathy or palpable thyroid masses Lungs: Clear to ascultation without wheezes , rhonchi, or rales Heart: Regular rate, regular rhythm without murmurs , gallops , or rubs Abdomen: Abdomen is tender primarily in the periumbilical to the right lower quadrant without rebound, guarding , or rigidity; bowel sounds positive and symmetric in all 4 quadrants. No organomegaly .       Mild tenderness over  McBurney's point, negative Murphy's sign Extremities: 2 plus symmetric pulses. No edema, clubbing or cyanosis Neurologic: normal ambulation, Motor symmetric without deficits, sensory intact Skin: warm, dry, no rashes   Labs:   All laboratory work was reviewed including any pertinent negatives or positives listed below:  Labs Reviewed  COMPREHENSIVE METABOLIC PANEL - Abnormal; Notable for the following:    Glucose, Bld 136 (*)    All other components within normal limits  CBC WITH DIFFERENTIAL/PLATELET - Abnormal; Notable for the following:    WBC 16.7 (*)    RBC 4.32 (*)    Neutro Abs 10.7 (*)    Lymphs Abs 4.1 (*)    Monocytes Absolute 1.7 (*)    All other components within normal limits  BRAIN NATRIURETIC PEPTIDE - Abnormal; Notable for the following:    B Natriuretic Peptide 425.0 (*)    All other components within normal limits  TROPONIN I - Abnormal; Notable for the following:    Troponin I 0.04 (*)    All other components within normal limits  URINALYSIS COMPLETEWITH MICROSCOPIC ([facility] ONLY) - Abnormal; Notable for the following:    Color, Urine COLORLESS (*)    APPearance CLEAR (*)    Hgb urine dipstick 1+ (*)    Leukocytes, UA 1+ (*)    Squamous Epithelial / LPF 0-5 (*)    All other components within normal limits  URINE CULTURE  LIPASE, BLOOD    EKG: ED ECG REPORT I, Jennye Moccasin, the attending physician, personally viewed  and interpreted this ECG.  Date: 02/11/2015 EKG Time: *2048 Rate: 88 Rhythm: normal sinus rhythm QRS Axis: normal Intervals: normal ST/T Wave abnormalities: Some ST depression which appears old in comparison to 04-25-2013  Conduction Disutrbances: none Narrative Interpretation: unremarkableCOMPARISON: CT 04/24/2013  FINDINGS: Lower chest: Emphysema at the lung bases with mild atelectasis.  Liver: No focal lesion allowing for lack contrast. Normal in size.  Hepatobiliary: Physiologically distended, no calcified stone. No biliary  dilatation.  Pancreas: Normal noncontrast appearance.  Spleen: Normal.  Adrenal glands: No nodule.  Kidneys: Mild prominence of the right renal pelvis and proximal ureter, no obstructing stone. No perinephric stranding. Calcifications at the right renal hilum are likely vascular. Mild prominence of the left renal pelvis and proximal ureter, no obstructing stone. There are vascular calcifications at the renal hila. Calcification in the upper left kidney, nonobstructing stone versus parenchymal calcification. Previously described small renal cysts are not seen given lack contrast.  Stomach/Bowel: Stomach is decompressed. There are no dilated or thickened small bowel loops. Umbilical hernia containing in oral appearing loop of small bowel, without associated bowel dilatation or inflammation. Small volume of stool throughout the colon without colonic wall thickening. Tortuous sigmoid colon with diverticulosis, no diverticulitis. There is stool distending the rectum. The appendix is normal.  Vascular/Lymphatic: No retroperitoneal adenopathy. Post abdominal aortic aneurysm repair. Unchanged size of the aneurysm sac, 4 cm compared to prior. No periaortic soft tissue stranding. There is a fem-fem bypass graft in place.  Reproductive: Prostate gland is not seen.  Bladder: Distended. No perivesicular inflammatory change or wall thickening.  Other: No free air, free fluid, or intra-abdominal fluid collection. Fat containing left inguinal hernia. Smaller fat containing right inguinal hernia.  Musculoskeletal: There are no acute or suspicious osseous abnormalities.  IMPRESSION: 1. Prominence of both renal collecting systems and proximal ureters, may be secondary to distended bladder. No obstructing stones. Calcifications at both renal hila are likely vascular in etiology. 2. Small umbilical hernia containing uncomplicated bowel. No obstruction or inflammation. 3. Sigmoid  diverticulosis without diverticulitis. No significant change with continued nonspecific ST-T wave abnormalities     EXAM: CHEST 2 VIEW  COMPARISON: 08/04/2014  FINDINGS: Mild enlargement of cardiac silhouette.  Calcified tortuous thoracic aorta.  Pulmonary vascularity normal.  Emphysematous and bronchitic changes consistent with COPD.  Chronic interstitial prominence and multiple Kerley B-lines bilaterally at the lung bases question pulmonary edema though a component of underlying fibrosis is not excluded.  No pleural effusion or pneumothorax.  Bones demineralized.  IMPRESSION: COPD changes with probable superimposed mild CHF/pulmonary edema as above.    I personally reviewed the radiologic studies    ED Course:  Patient's stay was overall uneventful and he had some clinical improvement just with supplemental oxygen. Patient was given additional dose of IV Lasix and also IV Rocephin for what appears to be a urinary tract infection. He does not appear to have any acute surgical issues in his abdomen and his urine was cultured here in emergency department. I felt was unlikely that he was septic though his white blood cell count is elevated appears to be elevated on his last visit here to the hospital. The patient's otherwise hemodynamically stable and I felt could be treated on an outpatient basis. He'll be observed here in emergency department and see how he responds to the diuretic therapy. He'll be discharged if possible on oral antibiotic therapy with his urine culture is pending.   Assessment:  Mild pulmonary edema Urinary tract infection  Final Clinical  Impression:   Final diagnoses:  Right-sided abdominal pain of unknown cause     Plan:  Patient will be signed out to my colleague for further observation.           Jennye Moccasin, MD 02/11/15 [phone]

## 2015-02-11 NOTE — ED Notes (Signed)
Patient transported to CT 

## 2015-02-11 NOTE — ED Notes (Signed)
Lab called regarding urine culture add on, will add on.

## 2015-02-11 NOTE — ED Notes (Signed)
Pt in with co shob since today worsened after playing golf.  Was admitted last year for the same and was dx with "fluid retention".  Pt also co abd pain that started yesterday.

## 2015-02-11 NOTE — ED Notes (Signed)
Patient transported to X-ray 

## 2015-02-11 NOTE — ED Notes (Signed)
MD Quigley at bedside. 

## 2015-02-12 ENCOUNTER — Ambulatory Visit (INDEPENDENT_AMBULATORY_CARE_PROVIDER_SITE_OTHER): Payer: PPO | Admitting: Family Medicine

## 2015-02-12 ENCOUNTER — Encounter: Payer: Self-pay | Admitting: Family Medicine

## 2015-02-12 ENCOUNTER — Telehealth: Payer: Self-pay

## 2015-02-12 VITALS — BP 102/52 | HR 87 | Temp 99.0°F | Resp 18 | Wt 160.0 lb

## 2015-02-12 DIAGNOSIS — N390 Urinary tract infection, site not specified: Secondary | ICD-10-CM

## 2015-02-12 DIAGNOSIS — I5022 Chronic systolic (congestive) heart failure: Secondary | ICD-10-CM

## 2015-02-12 DIAGNOSIS — I4891 Unspecified atrial fibrillation: Secondary | ICD-10-CM

## 2015-02-12 DIAGNOSIS — I359 Nonrheumatic aortic valve disorder, unspecified: Secondary | ICD-10-CM | POA: Diagnosis not present

## 2015-02-12 MED ORDER — CIPROFLOXACIN HCL 500 MG PO TABS
500.0000 mg | ORAL_TABLET | Freq: Two times a day (BID) | ORAL | Status: AC
Start: 2015-02-12 — End: 2015-02-19

## 2015-02-12 NOTE — Discharge Instructions (Signed)
Urinary Tract Infection Urinary tract infections (UTIs) can develop anywhere along your urinary tract. Your urinary tract is your body's drainage system for removing wastes and extra water. Your urinary tract includes two kidneys, two ureters, a bladder, and a urethra. Your kidneys are a pair of bean-shaped organs. Each kidney is about the size of your fist. They are located below your ribs, one on each side of your spine. CAUSES Infections are caused by microbes, which are microscopic organisms, including fungi, viruses, and bacteria. These organisms are so small that they can only be seen through a microscope. Bacteria are the microbes that most commonly cause UTIs. SYMPTOMS  Symptoms of UTIs may vary by age and gender of the patient and by the location of the infection. Symptoms in young women typically include a frequent and intense urge to urinate and a painful, burning feeling in the bladder or urethra during urination. Older women and men are more likely to be tired, shaky, and weak and have muscle aches and abdominal pain. A fever may mean the infection is in your kidneys. Other symptoms of a kidney infection include pain in your back or sides below the ribs, nausea, and vomiting. DIAGNOSIS To diagnose a UTI, your caregiver will ask you about your symptoms. Your caregiver will also ask you to provide a urine sample. The urine sample will be tested for bacteria and white blood cells. White blood cells are made by your body to help fight infection. TREATMENT  Typically, UTIs can be treated with medication. Because most UTIs are caused by a bacterial infection, they usually can be treated with the use of antibiotics. The choice of antibiotic and length of treatment depend on your symptoms and the type of bacteria causing your infection. HOME CARE INSTRUCTIONS  If you were prescribed antibiotics, take them exactly as your caregiver instructs you. Finish the medication even if you feel better after  you have only taken some of the medication.  Drink enough water and fluids to keep your urine clear or pale yellow.  Avoid caffeine, tea, and carbonated beverages. They tend to irritate your bladder.  Empty your bladder often. Avoid holding urine for long periods of time.  Empty your bladder before and after sexual intercourse.  After a bowel movement, women should cleanse from front to back. Use each tissue only once. SEEK MEDICAL CARE IF:   You have back pain.  You develop a fever.  Your symptoms do not begin to resolve within 3 days. SEEK IMMEDIATE MEDICAL CARE IF:   You have severe back pain or lower abdominal pain.  You develop chills.  You have nausea or vomiting.  You have continued burning or discomfort with urination. MAKE SURE YOU:   Understand these instructions.  Will watch your condition.  Will get help right away if you are not doing well or get worse.   This information is not intended to replace advice given to you by your health care provider. Make sure you discuss any questions you have with your health care provider.   Document Released: 01/19/2005 Document Revised: 12/31/2014 Document Reviewed: 05/20/2011 Elsevier Interactive Patient Education [employer]2016 [employer] Please return immediately if condition worsens. Please contact her primary physician or the physician you were given for referral. If you have any specialist physicians involved in her treatment and plan please also contact them. Thank you for using [facility]. Please take Extra Lasix for the next couple of days Urine culture is pending

## 2015-02-12 NOTE — Progress Notes (Signed)
Patient: Calvin Carpenter Male    DOB: 01-Jan-1931   81 y.o.   MRN: [id number] Visit Date: 02/12/2015  Today's Provider: Mila Merryonald Fisher, MD   Chief Complaint  Patient presents with  . Follow-up  . Abdominal Pain  . Shortness of Breath  . Urinary Tract Infection   Subjective:    HPI   Follow up ER visit  Patient was seen in ER for Abdominal pain and shortness of breath on 02/11/2015. He was treated for UTI, Acute pulmonary edema and right side abdominal pain of unknown cause.. Treatment for this included Lasix (IV) and Rocephin (IV) while in the ER. He was discharged early this morning with a prescription for oral Cipro which he has not yet filled.   He reports this condition is improved.Patient states his breathing is much better today. He reports he still has some pain in his abdomen. Patient denies any painful urination or blood in his urine. Patient states 5-6 days ago he noticed a decrease in his urine output.   He states that shortness of breath has been gradually building up for the last few weeks, but is greatly improved since ER visit over night. The abdominal has been mild and persistent for several days and he though he may have just pulled a muscled. He states it is in lower mid and right abdomen, and is quite a bit better since ER visit.  ------------------------------------------------------------------------------------       Allergies  Allergen Reactions  . Benazepril Hcl   . Mevacor  [Lovastatin]   . Fluvastatin Sodium Rash   Previous Medications   ALBUTEROL (VENTOLIN HFA) 108 (90 BASE) MCG/ACT INHALER    Inhale 2 puffs into the lungs every 6 (six) hours.   AMLODIPINE (NORVASC) 5 MG TABLET    Take 5 mg by mouth daily.   ASPIRIN 81 MG TABLET    Take 81 mg by mouth daily.   CIPROFLOXACIN (CIPRO) 500 MG TABLET    Take 1 tablet (500 mg total) by mouth 2 (two) times daily.   FLUOCINONIDE 0.1 % CREA    Apply daily as needed   FLUTICASONE (FLONASE) 50  MCG/ACT NASAL SPRAY    Place 1 spray into both nostrils as needed for allergies or rhinitis.   FUROSEMIDE (LASIX) 20 MG TABLET    Take 20 mg by mouth daily.    IPRATROPIUM-ALBUTEROL (DUONEB) 0.5-2.5 (3) MG/3ML SOLN    Take 3 mLs by nebulization every 6 (six) hours as needed.   METOPROLOL SUCCINATE (TOPROL-XL) 25 MG 24 HR TABLET    Take 1 tablet (25 mg total) by mouth daily.   MONTELUKAST (SINGULAIR) 10 MG TABLET    Take 1 tablet (10 mg total) by mouth as needed.   QUINAPRIL (ACCUPRIL) 20 MG TABLET    Take 1 tablet (20 mg total) by mouth daily.   SIMVASTATIN (ZOCOR) 40 MG TABLET    Take 1 tablet (40 mg total) by mouth daily.   VERAPAMIL (VERELAN PM) 240 MG 24 HR CAPSULE    Take 1 capsule (240 mg total) by mouth daily.    Review of Systems  Constitutional: Positive for appetite change. Negative for diaphoresis and fatigue.  Respiratory: Positive for cough and chest tightness.   Cardiovascular: Negative for palpitations.  Gastrointestinal: Positive for abdominal distention. Negative for blood in stool and anal bleeding.  Musculoskeletal: Negative for back pain, joint swelling and gait problem.  Neurological: Positive for dizziness, weakness and light-headedness. Negative for tremors, seizures,  syncope, speech difficulty and numbness.    Social History  Substance Use Topics  . Smoking status: Former Smoker -- 1.00 packs/day for 65 years    Types: Cigarettes    Quit date: 04/26/2011  . Smokeless tobacco: Never Used  . Alcohol Use: No   Objective:   BP 102/52 mmHg  Pulse 87  Temp(Src) 99 F (37.2 C) (Oral)  Resp 18  Wt 160 lb (72.576 kg)  SpO2 93%  Physical Exam   General Appearance:    Alert, cooperative, no distress  Eyes:    PERRL, conjunctiva/corneas clear, EOM's intact       Lungs:     Clear to auscultation bilaterally, respirations unlabored  Heart:    Regular rate and rhythm, II/VI systolic murmur. Trace pedal edema  Neurologic:   Awake, alert, oriented x 3. No apparent  focal neurological           defect.        Dg Chest 2 View (ER) 02/11/2015  IMPRESSION: COPD changes with probable superimposed mild CHF/pulmonary edema as above. Electronically Signed   By: Ulyses Southward M.D.   On: 02/11/2015 21:56    Ct Renal Stone Study (ER) 02/11/2015  IMPRESSION: 1. Prominence of both renal collecting systems and proximal ureters, may be secondary to distended bladder. No obstructing stones. Calcifications at both renal hila are likely vascular in etiology. 2. Small umbilical hernia containing uncomplicated bowel. No obstruction or inflammation. 3. Sigmoid diverticulosis without diverticulitis. Electronically Signed   By: Rubye Oaks M.D.   On: 02/11/2015 23:48    Results for orders placed or performed during the hospital encounter of 02/11/15  (ER)  Comprehensive metabolic panel  Result Value Ref Range   Sodium 138 135 - 145 mmol/L   Potassium 3.7 3.5 - 5.1 mmol/L   Chloride 104 101 - 111 mmol/L   CO2 26 22 - 32 mmol/L   Glucose, Bld 136 (H) 65 - 99 mg/dL   BUN 12 6 - 20 mg/dL   Creatinine, Ser 1.61 0.61 - 1.24 mg/dL   Calcium 9.4 8.9 - 09.6 mg/dL   Total Protein 7.7 6.5 - 8.1 g/dL   Albumin 4.4 3.5 - 5.0 g/dL   AST 26 15 - 41 U/L   ALT 21 17 - 63 U/L   Alkaline Phosphatase 41 38 - 126 U/L   Total Bilirubin 1.0 0.3 - 1.2 mg/dL   GFR calc non Af Amer >60 >60 mL/min   GFR calc Af Amer >60 >60 mL/min   Anion gap 8 5 - 15  CBC with Differential/Platelet  Result Value Ref Range   WBC 16.7 (H) 3.8 - 10.6 K/uL   RBC 4.32 (L) 4.40 - 5.90 MIL/uL   Hemoglobin 13.5 13.0 - 18.0 g/dL   HCT 04.5 40.9 - 81.1 %   MCV 95.7 80.0 - 100.0 fL   MCH 31.3 26.0 - 34.0 pg   MCHC 32.7 32.0 - 36.0 g/dL   RDW 91.4 78.2 - 95.6 %   Platelets 188 150 - 440 K/uL   Neutrophils Relative % 63 %   Neutro Abs 10.7 (H) 1.4 - 6.5 K/uL   Lymphocytes Relative 25 %   Lymphs Abs 4.1 (H) 1.0 - 3.6 K/uL   Monocytes Relative 10 %   Monocytes Absolute 1.7 (H) 0.2 - 1.0 K/uL   Eosinophils  Relative 1 %   Eosinophils Absolute 0.2 0 - 0.7 K/uL   Basophils Relative 1 %   Basophils Absolute 0.1 0 -  0.1 K/uL  Brain natriuretic peptide  Result Value Ref Range   B Natriuretic Peptide 425.0 (H) 0.0 - 100.0 pg/mL  Troponin I  Result Value Ref Range   Troponin I 0.04 (H) <0.031 ng/mL  Urinalysis complete, with microscopic ([facility] only)  Result Value Ref Range   Color, Urine COLORLESS (A) YELLOW   APPearance CLEAR (A) CLEAR   Glucose, UA NEGATIVE NEGATIVE mg/dL   Bilirubin Urine NEGATIVE NEGATIVE   Ketones, ur NEGATIVE NEGATIVE mg/dL   Specific Gravity, Urine 1.006 1.005 - 1.030   Hgb urine dipstick 1+ (A) NEGATIVE   pH 5.0 5.0 - 8.0   Protein, ur NEGATIVE NEGATIVE mg/dL   Nitrite NEGATIVE NEGATIVE   Leukocytes, UA 1+ (A) NEGATIVE   RBC / HPF 0-5 0 - 5 RBC/hpf   WBC, UA 6-30 0 - 5 WBC/hpf   Bacteria, UA NONE SEEN NONE SEEN   Squamous Epithelial / LPF 0-5 (A) NONE SEEN   Mucous PRESENT   Lipase, blood  Result Value Ref Range   Lipase 22 22 - 51 U/L       Assessment & Plan:     1. Chronic systolic congestive heart failure (HCC) Mild exacerbation. He may take an addition  furosemide daily as needed for shortness of breath. Need to re-assess cardiac and aortic valve function.   2. Urinary tract infection without hematuria, site unspecified Suspect cause of abdominal pain, which is improved since ER visit overnight. Will reassess when finished with Cipro. He is to call if pain flares back up.   3. Atrial fibrillation, unspecified type (HCC) Rate well controlled on CCB and BB, but need to reassess systolic function as above since he is on verapamil  4. Aortic valve disorder Need echo.        Mila Merry, MD  [facility]

## 2015-02-12 NOTE — Telephone Encounter (Signed)
Patient called saying that he went to the ER last night due to some moderate shortness of breath. Patient reports that he also has some generalized abdominal pain that has been constant. Patient reports that his symptoms (shortness of breath) seems to be better since he got home. He reports that he received Lasix and an abx during his visit. Patient was advised to F/U with PCP in 1-2 days. Patient denies any changes to medications. He has scheduled and appt for today at 9:45.

## 2015-02-12 NOTE — Patient Instructions (Signed)
You may take a second furosemide per day if you feel short of breath

## 2015-02-12 NOTE — ED Notes (Signed)
MD Quigley at bedside. 

## 2015-02-13 NOTE — Telephone Encounter (Signed)
-----   Message from Malva Limesonald E Fisher, MD sent at 02/12/2015  1:24 PM EDT ----- Regarding: Echo cardiogram Please advise patient we need to go ahead and get up to date echocardiogram since he was diagnosed with heart failure at ER last night. We will let Dr. Darrold JunkerParaschos know about the results. Order has been entered into EMR. Please forward message to sarah to schedule test. Thanks.

## 2015-02-18 ENCOUNTER — Other Ambulatory Visit: Payer: Self-pay | Admitting: Family Medicine

## 2015-02-18 DIAGNOSIS — I5022 Chronic systolic (congestive) heart failure: Secondary | ICD-10-CM

## 2015-02-20 ENCOUNTER — Ambulatory Visit
Admission: RE | Admit: 2015-02-20 | Discharge: 2015-02-20 | Disposition: A | Discharge status: Home / Self Care | Payer: PPO | Source: Ambulatory Visit | Attending: Family Medicine | Admitting: Family Medicine

## 2015-02-20 DIAGNOSIS — I5022 Chronic systolic (congestive) heart failure: Secondary | ICD-10-CM

## 2015-02-20 NOTE — Progress Notes (Signed)
*  PRELIMINARY RESULTS* Echocardiogram 2D Echocardiogram has been performed.  Calvin HousekeeperJerry R Carpenter 02/20/2015, 11:22 AM

## 2015-02-25 ENCOUNTER — Telehealth: Payer: Self-pay

## 2015-02-25 ENCOUNTER — Other Ambulatory Visit: Payer: Self-pay | Admitting: Family Medicine

## 2015-02-25 DIAGNOSIS — I10 Essential (primary) hypertension: Secondary | ICD-10-CM

## 2015-02-25 MED ORDER — VERAPAMIL HCL ER 180 MG PO TBCR: 180.0000 mg | EXTENDED_RELEASE_TABLET | Freq: Every day | ORAL | Status: DC

## 2015-02-25 NOTE — Telephone Encounter (Signed)
Advised wife of results. She reports that husband may call back if he has additional questions. Sent in Rx into the pharmacy. Patient's wife reports that patient would prefer to go to Dr. Mariah MillingGollan, but he will call back to confirm.

## 2015-02-25 NOTE — Telephone Encounter (Signed)
-----   Message from Malva Limesonald E Fisher, MD sent at 02/25/2015  8:35 AM EDT ----- Echocardiogram shows aortic valve getting a little bit worse and heart chamber is a little weak. He needs to reduce dose of verapamil to 180mg  daily, #30 1 rf since this medication contributes to fluid retention. He needs to go ahead and set up a follow up with Dr. Darrold JunkerParaschos in the next month for CHF and aortic stenosis.Please send  prescription for 180mg  verapamil to his local pharmacy and have him stop the 240mg  verapamil.

## 2015-02-25 NOTE — Telephone Encounter (Signed)
Patient is returning call.  °

## 2015-02-26 NOTE — Telephone Encounter (Signed)
Advised patient of previous message listed in chart.

## 2015-03-24 ENCOUNTER — Other Ambulatory Visit: Payer: Self-pay | Admitting: Family Medicine

## 2015-04-07 IMAGING — CT CT PELVIS W/ CM
2 of 3 series · 15 of 46 positions shown, 17 images · IV contrast (isovue)
Comparison: Abdominal pelvic CT [DATE].

CLINICAL DATA: Left groin hernia repair in [place].  Recurrent pain.

EXAM:
CT PELVIS WITH CONTRAST
TECHNIQUE: Multidetector CT imaging of the pelvis was performed using the
standard protocol following the bolus administration of intravenous
contrast.
CONTRAST:  100 ml Isovue 370.

[Series 2: routine abd pel with · axial · 0.68mm/px · z∈[-548,-328]mm · 12 of 52 slices shown, 14 images]
[im 4/52  soft-tissue]
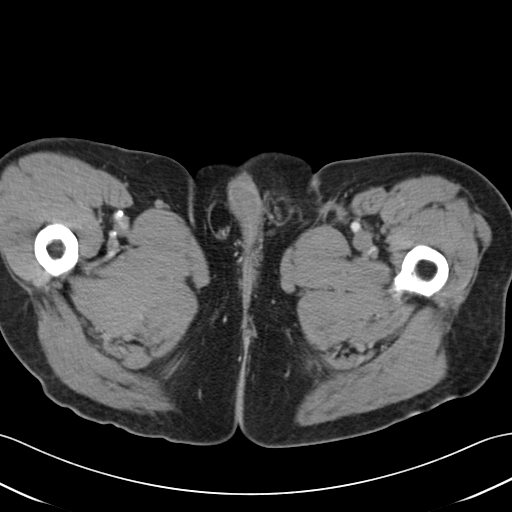
[im 4/52  bone]
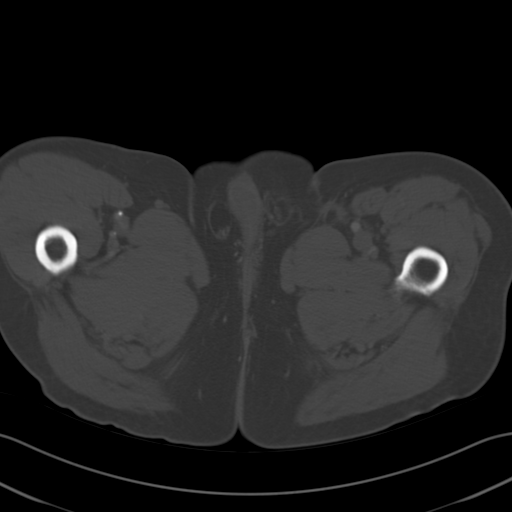
[im 7/52  soft-tissue]
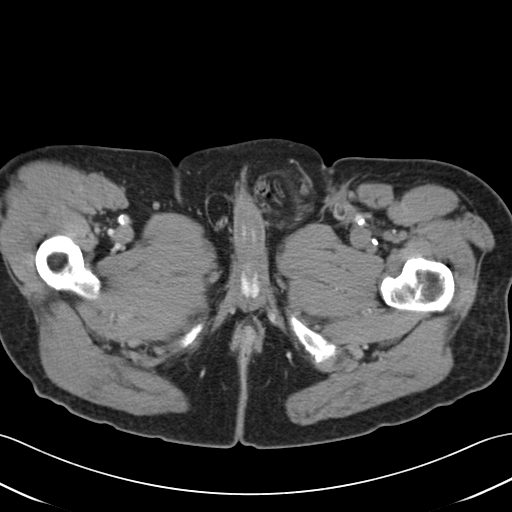
[im 12/52  soft-tissue]
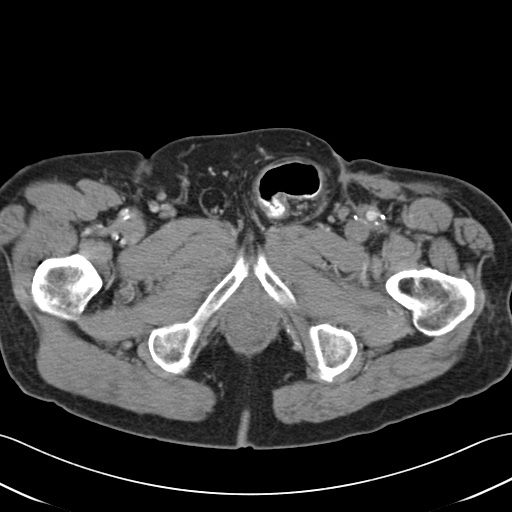
[im 15/52  soft-tissue]
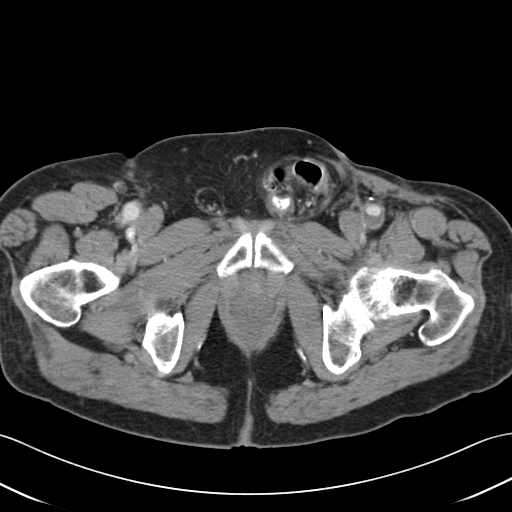
[im 20/52  soft-tissue]
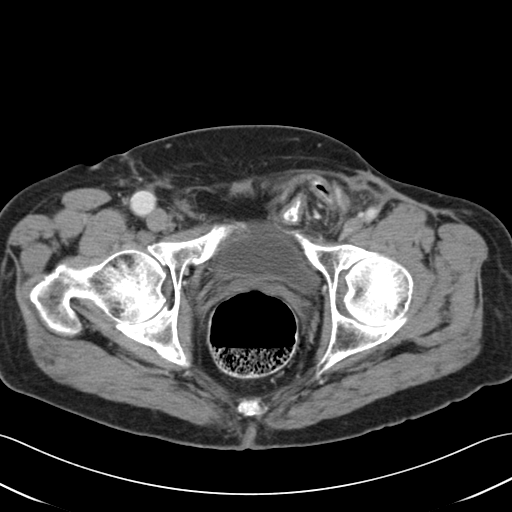
[im 24/52  soft-tissue]
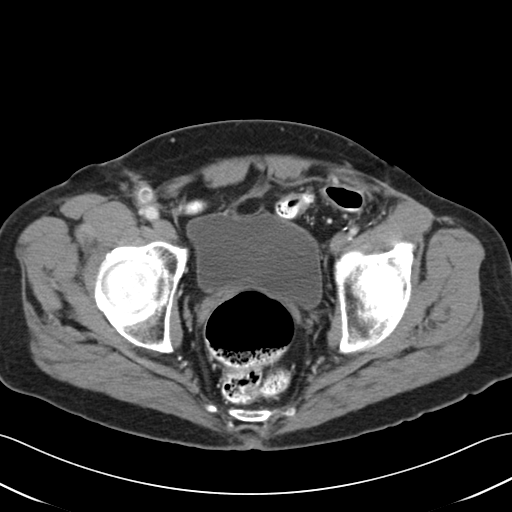
[im 28/52  soft-tissue]
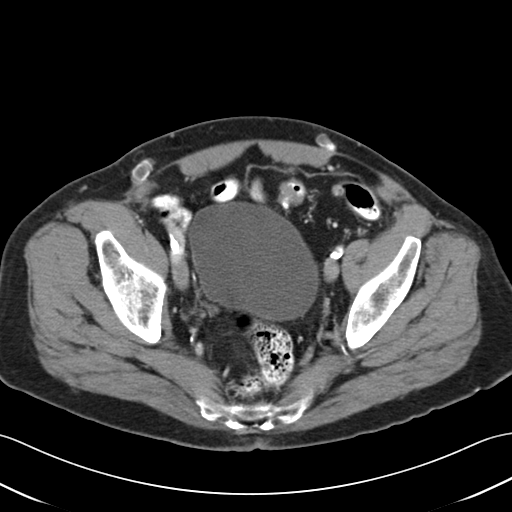
[im 32/52  soft-tissue]
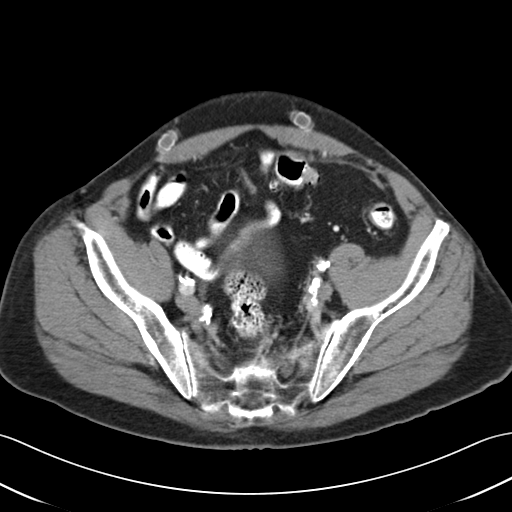
[im 37/52  soft-tissue]
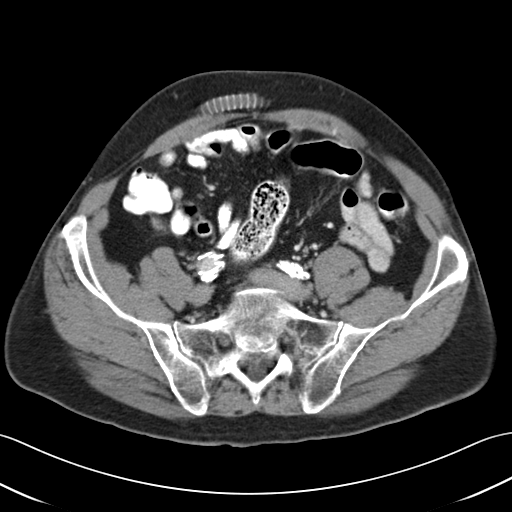
[im 37/52  bone]
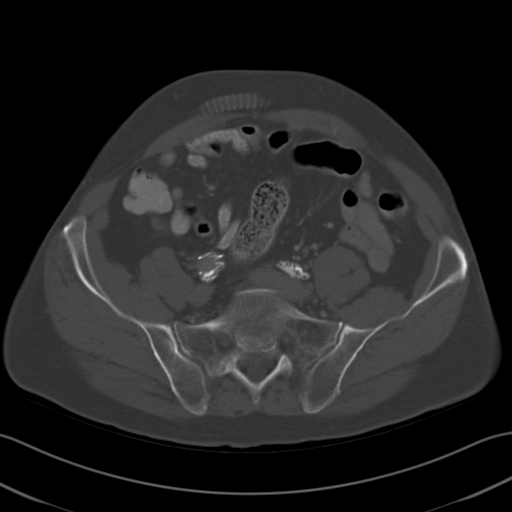
[im 40/52  soft-tissue]
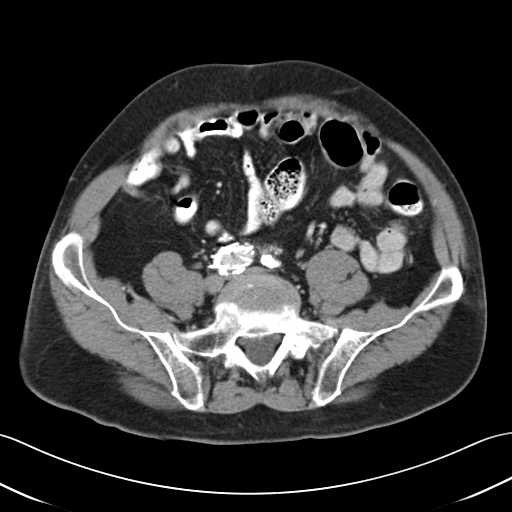
[im 45/52  soft-tissue]
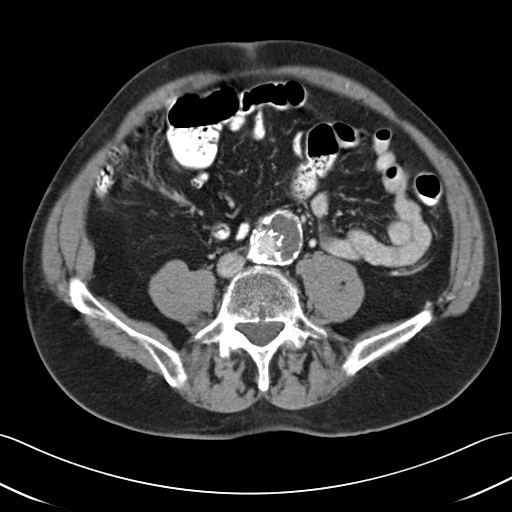
[im 48/52  soft-tissue]
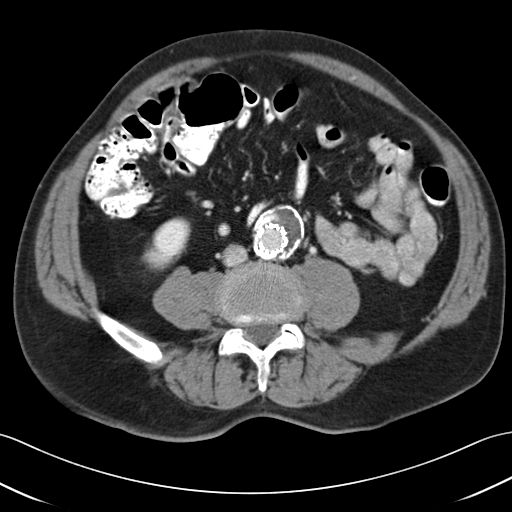

[Series 4: cor routine abd pel with · coronal · 0.55mm/px · 3 of 141 slices shown]
[im 47/141  soft-tissue]
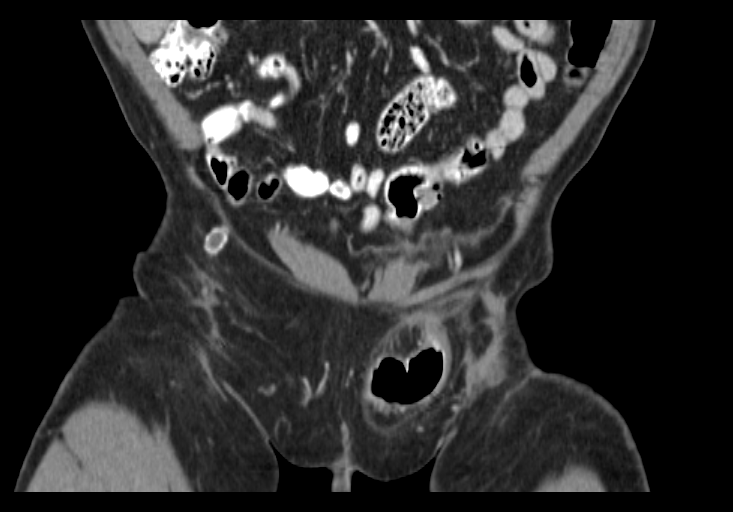
[im 63/141  soft-tissue]
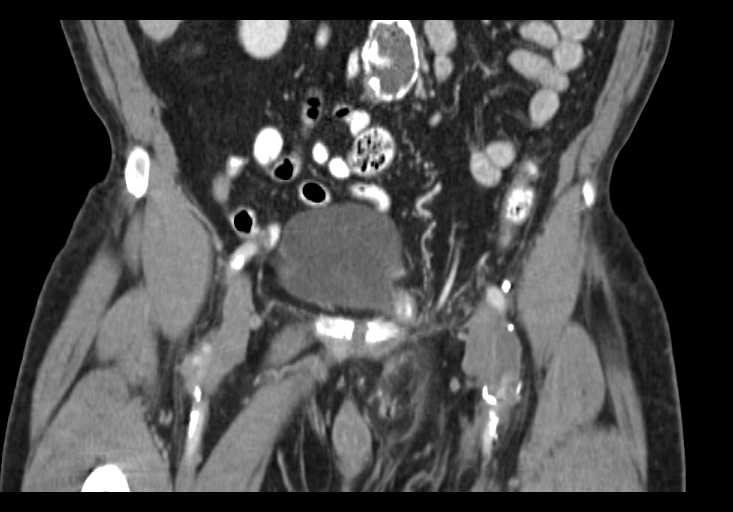
[im 78/141  soft-tissue]
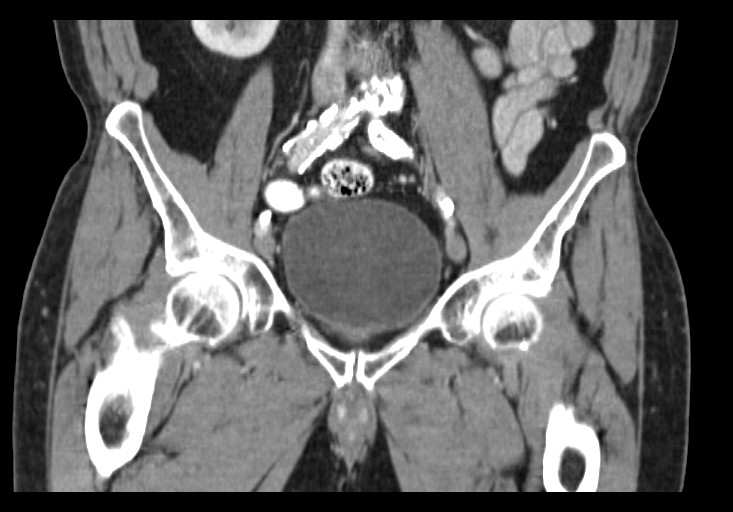

[15 of 46 positions shown; findings below may reference images not displayed]

FINDINGS: There is a recurrent left inguinal hernia containing a loop of
sigmoid colon. This is very similar in appearance to the prior
examination with the hernia measuring up to 5.1 cm transverse on
image 39. There is no evidence of incarceration or bowel
obstruction. Prominent fat in the right inguinal canal is stable.
There is no herniated bowel on the right.

Patient is status post aortic stent grafting with a metallic stent
extending into the right common iliac artery. The thrombosed lumen
of the distal abdominal aortic aneurysm is stable, measuring 3.8 cm
on image 7. The iliac lumen is patent on the right. The patient has
a femoral-femoral bypass with limited contrast opacification, likely
chronically occluded. The left native common iliac artery also
appears chronically occluded. There is reconstitution of the left
femoral vessels in the groin. Surgical clips are present in both
groins.

The urinary bladder and prostate gland appear normal. No acute
osseous findings are evident.
IMPRESSION: 1. Recurrent left inguinal hernia containing sigmoid colon. No
evidence of incarceration or obstruction.
2. Stable small right inguinal hernia containing only fat.
3. Stable postsurgical findings following aortic stent grafting and
femoral femoral bypass. The bypass graft demonstrates limited
contrast opacification and is likely chronically occluded. There is
the distal reconstitution of the left femoral vessels in the groin.

## 2015-04-10 ENCOUNTER — Encounter: Payer: Self-pay | Admitting: Family Medicine

## 2015-04-10 ENCOUNTER — Ambulatory Visit (INDEPENDENT_AMBULATORY_CARE_PROVIDER_SITE_OTHER): Payer: PPO | Admitting: Family Medicine

## 2015-04-10 VITALS — BP 114/54 | HR 84 | Temp 98.2°F | Resp 18 | Wt 169.0 lb

## 2015-04-10 DIAGNOSIS — S8391XA Sprain of unspecified site of right knee, initial encounter: Secondary | ICD-10-CM | POA: Diagnosis not present

## 2015-04-10 MED ORDER — HYDROCODONE-ACETAMINOPHEN 5-325 MG PO TABS: 1.0000 | ORAL_TABLET | Freq: Four times a day (QID) | ORAL | Status: AC | PRN

## 2015-04-10 MED ORDER — DICLOFENAC SODIUM 2 % TD SOLN: TRANSDERMAL | Status: DC

## 2015-04-10 NOTE — Progress Notes (Signed)
Patient: Calvin Carpenter Male    DOB: Jul 11, 1930   81 y.o.   MRN: [id number] Visit Date: 04/10/2015  Today's Provider: Mila Merry, MD   Chief Complaint  Patient presents with  . Knee Pain    x 4 days   Subjective:    Knee Pain  Incident onset: started 4 days ago  The incident occurred at home. The injury mechanism is unknown (patient was kneeling on his knees to plug a keyboard into an electrical socket. Pain started shortly after standing up). The pain is present in the right knee. The quality of the pain is described as aching. The pain is at a severity of 10/10. The pain has been constant since onset. Associated symptoms include an inability to bear weight. Pertinent negatives include no numbness or tingling. He reports no foreign bodies present. The symptoms are aggravated by weight bearing. He has tried NSAIDs and acetaminophen (has also tried using a knee brace) for the symptoms. The treatment provided mild relief.  Pain started acutely when standing himself up from kneeling position. States that elastic knee brace helps quite a bit, but is still having a lot of pain when walking.      Allergies  Allergen Reactions  . Benazepril Hcl   . Mevacor  [Lovastatin]   . Fluvastatin Sodium Rash   Previous Medications   ALBUTEROL (VENTOLIN HFA) 108 (90 BASE) MCG/ACT INHALER    Inhale 2 puffs into the lungs every 6 (six) hours.   AMLODIPINE (NORVASC) 5 MG TABLET    Take 5 mg by mouth daily.   ASPIRIN 81 MG TABLET    Take 81 mg by mouth daily.   FLUOCINONIDE 0.1 % CREA    Apply daily as needed   FLUTICASONE (FLONASE) 50 MCG/ACT NASAL SPRAY    Place 1 spray into both nostrils as needed for allergies or rhinitis.   FUROSEMIDE (LASIX) 20 MG TABLET    TAKE ONE (1) TABLET EACH DAY   IBUPROFEN (ADVIL,MOTRIN) 400 MG TABLET    Take 1 tablet (400 mg total) by mouth 2 (two) times daily.   IPRATROPIUM-ALBUTEROL (DUONEB) 0.5-2.5 (3) MG/3ML SOLN    Take 3 mLs by nebulization every 6 (six)  hours as needed.   METOPROLOL SUCCINATE (TOPROL-XL) 25 MG 24 HR TABLET    Take 1 tablet (25 mg total) by mouth daily.   MONTELUKAST (SINGULAIR) 10 MG TABLET    Take 1 tablet (10 mg total) by mouth as needed.   QUINAPRIL (ACCUPRIL) 20 MG TABLET    Take 1 tablet (20 mg total) by mouth daily.   SIMVASTATIN (ZOCOR) 40 MG TABLET    Take 1 tablet (40 mg total) by mouth daily.   VERAPAMIL (CALAN-SR) 180 MG CR TABLET    Take 1 tablet (180 mg total) by mouth at bedtime.    Review of Systems  Constitutional: Negative for fever, chills and appetite change.  Respiratory: Negative for chest tightness, shortness of breath and wheezing.   Cardiovascular: Negative for chest pain and palpitations.  Gastrointestinal: Negative for nausea, vomiting and abdominal pain.  Musculoskeletal: Positive for joint swelling (right knee) and arthralgias (right knee).  Neurological: Negative for tingling and numbness.    Social History  Substance Use Topics  . Smoking status: Former Smoker -- 1.00 packs/day for 65 years    Types: Cigarettes    Quit date: 04/26/2011  . Smokeless tobacco: Never Used  . Alcohol Use: No   Objective:   BP  114/54 mmHg  Pulse 84  Temp(Src) 98.2 F (36.8 C) (Oral)  Resp 18  Wt 169 lb (76.658 kg)  SpO2 94%  Physical Exam  General appearance: alert, well developed, well nourished, cooperative and in no distress Head: Normocephalic, without obvious abnormality, atraumatic Lungs: Respirations even and unlabored MS: Tender just above superior-medial aspect of right patella. Trace swelling. Pain with knee extension. No erythema. No crepitus. No joint line tenderness or swelling.      Assessment & Plan:     1. Knee sprain and strain, right, initial encounter This is an acute injury and will treat conservatively. Continue to wear elastic knee brace whenever ambulating. Call if not steadily imroving over the next couple of weeks.  - HYDROcodone-acetaminophen (NORCO/VICODIN) 5-325 MG  tablet; Take 1 tablet by mouth every 6 (six) hours as needed for moderate pain.  Dispense: 30 tablet; Refill: 0 - Diclofenac Sodium 2 % SOLN; Up to 40 drops to affected area of knee four times a day as needed.  Dispense: 1 Bottle; Refill: 0       Mila Merryonald Fisher, MD  [facility]

## 2015-04-14 ENCOUNTER — Telehealth: Payer: Self-pay | Admitting: Family Medicine

## 2015-04-14 ENCOUNTER — Other Ambulatory Visit: Payer: Self-pay | Admitting: Family Medicine

## 2015-04-14 MED ORDER — PREDNISONE 10 MG PO TABS: ORAL_TABLET | ORAL | Status: DC

## 2015-04-14 NOTE — Telephone Encounter (Signed)
Please advise 

## 2015-04-14 NOTE — Telephone Encounter (Signed)
Pt contacted office for refill request on the following medications:  Prednisone 10mg .  For swelling in his knee.  [facility].  CB#[phone]/MW

## 2015-04-14 NOTE — Telephone Encounter (Signed)
Pt contacted office for refill request on the following medications:  Prednisone 10mg.  For swelling in his knee.  [facility].  CB#[phone]/MW °

## 2015-04-16 ENCOUNTER — Encounter: Payer: Self-pay | Admitting: Emergency Medicine

## 2015-04-16 ENCOUNTER — Inpatient Hospital Stay
Admission: EM | Admit: 2015-04-16 | Discharge: 2015-04-17 | DRG: 190 | Disposition: A | Discharge status: Home / Self Care | Payer: PPO | Attending: Specialist | Admitting: Specialist

## 2015-04-16 ENCOUNTER — Emergency Department: Payer: PPO

## 2015-04-16 ENCOUNTER — Inpatient Hospital Stay: Payer: PPO

## 2015-04-16 DIAGNOSIS — J9691 Respiratory failure, unspecified with hypoxia: Secondary | ICD-10-CM | POA: Diagnosis present

## 2015-04-16 DIAGNOSIS — J189 Pneumonia, unspecified organism: Secondary | ICD-10-CM | POA: Diagnosis present

## 2015-04-16 DIAGNOSIS — Z79899 Other long term (current) drug therapy: Secondary | ICD-10-CM

## 2015-04-16 DIAGNOSIS — I714 Abdominal aortic aneurysm, without rupture: Secondary | ICD-10-CM | POA: Diagnosis present

## 2015-04-16 DIAGNOSIS — I350 Nonrheumatic aortic (valve) stenosis: Secondary | ICD-10-CM | POA: Diagnosis present

## 2015-04-16 DIAGNOSIS — I4891 Unspecified atrial fibrillation: Secondary | ICD-10-CM | POA: Diagnosis present

## 2015-04-16 DIAGNOSIS — M25461 Effusion, right knee: Secondary | ICD-10-CM | POA: Diagnosis present

## 2015-04-16 DIAGNOSIS — I252 Old myocardial infarction: Secondary | ICD-10-CM | POA: Diagnosis not present

## 2015-04-16 DIAGNOSIS — Z87891 Personal history of nicotine dependence: Secondary | ICD-10-CM | POA: Diagnosis not present

## 2015-04-16 DIAGNOSIS — I2510 Atherosclerotic heart disease of native coronary artery without angina pectoris: Secondary | ICD-10-CM | POA: Diagnosis present

## 2015-04-16 DIAGNOSIS — I5020 Unspecified systolic (congestive) heart failure: Secondary | ICD-10-CM | POA: Diagnosis present

## 2015-04-16 DIAGNOSIS — I248 Other forms of acute ischemic heart disease: Secondary | ICD-10-CM | POA: Diagnosis present

## 2015-04-16 DIAGNOSIS — J440 Chronic obstructive pulmonary disease with acute lower respiratory infection: Secondary | ICD-10-CM | POA: Diagnosis present

## 2015-04-16 DIAGNOSIS — Z951 Presence of aortocoronary bypass graft: Secondary | ICD-10-CM | POA: Diagnosis not present

## 2015-04-16 DIAGNOSIS — Z888 Allergy status to other drugs, medicaments and biological substances status: Secondary | ICD-10-CM

## 2015-04-16 DIAGNOSIS — Z7982 Long term (current) use of aspirin: Secondary | ICD-10-CM | POA: Diagnosis not present

## 2015-04-16 DIAGNOSIS — I110 Hypertensive heart disease with heart failure: Secondary | ICD-10-CM | POA: Diagnosis present

## 2015-04-16 DIAGNOSIS — I214 Non-ST elevation (NSTEMI) myocardial infarction: Secondary | ICD-10-CM

## 2015-04-16 DIAGNOSIS — M25561 Pain in right knee: Secondary | ICD-10-CM

## 2015-04-16 LAB — CBC WITH DIFFERENTIAL/PLATELET
Basophils Absolute: 0 10*3/uL (ref 0–0.1)
Basophils Relative: 0 %
EOS ABS: 0 10*3/uL (ref 0–0.7)
EOS PCT: 0 %
HCT: 46.2 % (ref 40.0–52.0)
Hemoglobin: 15 g/dL (ref 13.0–18.0)
Lymphocytes Relative: 34 %
Lymphs Abs: 7.9 10*3/uL — ABNORMAL HIGH (ref 1.0–3.6)
MCH: 31.5 pg (ref 26.0–34.0)
MCHC: 32.4 g/dL (ref 32.0–36.0)
MCV: 97.2 fL (ref 80.0–100.0)
MONO ABS: 2.1 10*3/uL — AB (ref 0.2–1.0)
Monocytes Relative: 9 %
NEUTROS PCT: 57 %
Neutro Abs: 13.3 10*3/uL — ABNORMAL HIGH (ref 1.4–6.5)
PLATELETS: 208 10*3/uL (ref 150–440)
RBC: 4.75 MIL/uL (ref 4.40–5.90)
RDW: 13.8 % (ref 11.5–14.5)
WBC: 23.3 10*3/uL — AB (ref 3.8–10.6)

## 2015-04-16 LAB — COMPREHENSIVE METABOLIC PANEL
ALT: 25 U/L (ref 17–63)
AST: 27 U/L (ref 15–41)
Albumin: 4.5 g/dL (ref 3.5–5.0)
Alkaline Phosphatase: 43 U/L (ref 38–126)
Anion gap: 13 (ref 5–15)
BUN: 17 mg/dL (ref 6–20)
CHLORIDE: 105 mmol/L (ref 101–111)
CO2: 25 mmol/L (ref 22–32)
CREATININE: 0.91 mg/dL (ref 0.61–1.24)
Calcium: 9.6 mg/dL (ref 8.9–10.3)
GFR calc Af Amer: >60 mL/min (ref >60)
GFR calc non Af Amer: >60 mL/min (ref >60)
Glucose, Bld: 154 mg/dL — ABNORMAL HIGH (ref 65–99)
Potassium: 3.8 mmol/L (ref 3.5–5.1)
SODIUM: 143 mmol/L (ref 135–145)
Total Bilirubin: 0.8 mg/dL (ref 0.3–1.2)
Total Protein: 7.8 g/dL (ref 6.5–8.1)

## 2015-04-16 LAB — TROPONIN I
Troponin I: 0.08 ng/mL — ABNORMAL HIGH (ref <0.031)
Troponin I: 1.99 ng/mL — ABNORMAL HIGH (ref <0.031)
Troponin I: 1.64 ng/mL — ABNORMAL HIGH (ref <0.031)

## 2015-04-16 LAB — BRAIN NATRIURETIC PEPTIDE: B Natriuretic Peptide: 679 pg/mL — ABNORMAL HIGH (ref 0.0–100.0)

## 2015-04-16 MED ORDER — PREDNISONE 20 MG PO TABS: 20.0000 mg | ORAL_TABLET | Freq: Every day | ORAL | Status: DC

## 2015-04-16 MED ORDER — SODIUM CHLORIDE 0.9 % IJ SOLN
3.0000 mL | Freq: Two times a day (BID) | INTRAMUSCULAR | Status: DC
  Administered 2015-04-16 – 2015-04-17 (×3): 3 mL via INTRAVENOUS

## 2015-04-16 MED ORDER — PREDNISONE 20 MG PO TABS: 40.0000 mg | ORAL_TABLET | Freq: Every day | ORAL | Status: DC

## 2015-04-16 MED ORDER — ONDANSETRON HCL 4 MG PO TABS: 4.0000 mg | ORAL_TABLET | Freq: Four times a day (QID) | ORAL | Status: DC | PRN

## 2015-04-16 MED ORDER — QUINAPRIL HCL 10 MG PO TABS
20.0000 mg | ORAL_TABLET | Freq: Every day | ORAL | Status: DC
  Filled 2015-04-16: qty 2

## 2015-04-16 MED ORDER — SIMVASTATIN 40 MG PO TABS: 40.0000 mg | ORAL_TABLET | Freq: Every day | ORAL | Status: DC

## 2015-04-16 MED ORDER — HEPARIN SODIUM (PORCINE) 5000 UNIT/ML IJ SOLN
5000.0000 [IU] | Freq: Three times a day (TID) | INTRAMUSCULAR | Status: DC
  Administered 2015-04-16 – 2015-04-17 (×2): 5000 [IU] via SUBCUTANEOUS
  Filled 2015-04-16 (×2): qty 1

## 2015-04-16 MED ORDER — ALBUTEROL SULFATE (2.5 MG/3ML) 0.083% IN NEBU
2.5000 mg | INHALATION_SOLUTION | Freq: Four times a day (QID) | RESPIRATORY_TRACT | Status: DC
Start: 2015-04-16 — End: 2015-04-17
  Administered 2015-04-16 – 2015-04-17 (×4): 2.5 mg via RESPIRATORY_TRACT
  Filled 2015-04-16 (×4): qty 3

## 2015-04-16 MED ORDER — FLUTICASONE PROPIONATE 50 MCG/ACT NA SUSP
1.0000 | NASAL | Status: DC | PRN
  Filled 2015-04-16: qty 16

## 2015-04-16 MED ORDER — ACETAMINOPHEN 650 MG RE SUPP: 650.0000 mg | Freq: Four times a day (QID) | RECTAL | Status: DC | PRN

## 2015-04-16 MED ORDER — ASPIRIN EC 81 MG PO TBEC
81.0000 mg | DELAYED_RELEASE_TABLET | Freq: Every day | ORAL | Status: DC
  Administered 2015-04-17: 81 mg via ORAL
  Filled 2015-04-16: qty 1

## 2015-04-16 MED ORDER — FUROSEMIDE 20 MG PO TABS
20.0000 mg | ORAL_TABLET | Freq: Two times a day (BID) | ORAL | Status: DC
  Administered 2015-04-16 – 2015-04-17 (×2): 20 mg via ORAL
  Filled 2015-04-16 (×2): qty 1

## 2015-04-16 MED ORDER — SIMVASTATIN 40 MG PO TABS
40.0000 mg | ORAL_TABLET | Freq: Every day | ORAL | Status: DC
  Administered 2015-04-16: 40 mg via ORAL
  Filled 2015-04-16: qty 1

## 2015-04-16 MED ORDER — PREDNISONE 10 MG PO TABS: 10.0000 mg | ORAL_TABLET | Freq: Every day | ORAL | Status: DC

## 2015-04-16 MED ORDER — DOCUSATE SODIUM 100 MG PO CAPS
100.0000 mg | ORAL_CAPSULE | Freq: Two times a day (BID) | ORAL | Status: DC
  Administered 2015-04-17: 100 mg via ORAL
  Filled 2015-04-16 (×2): qty 1

## 2015-04-16 MED ORDER — LEVOFLOXACIN 500 MG PO TABS
500.0000 mg | ORAL_TABLET | Freq: Every day | ORAL | Status: DC
  Administered 2015-04-17: 500 mg via ORAL
  Filled 2015-04-16: qty 1

## 2015-04-16 MED ORDER — VERAPAMIL HCL ER 180 MG PO TBCR
180.0000 mg | EXTENDED_RELEASE_TABLET | Freq: Every day | ORAL | Status: DC
  Administered 2015-04-16: 180 mg via ORAL
  Filled 2015-04-16: qty 1

## 2015-04-16 MED ORDER — METOPROLOL SUCCINATE ER 25 MG PO TB24: 25.0000 mg | ORAL_TABLET | Freq: Every day | ORAL | Status: DC

## 2015-04-16 MED ORDER — IPRATROPIUM-ALBUTEROL 0.5-2.5 (3) MG/3ML IN SOLN: 3.0000 mL | Freq: Four times a day (QID) | RESPIRATORY_TRACT | Status: DC | PRN

## 2015-04-16 MED ORDER — HYDROCODONE-ACETAMINOPHEN 5-325 MG PO TABS: 1.0000 | ORAL_TABLET | Freq: Four times a day (QID) | ORAL | Status: DC | PRN

## 2015-04-16 MED ORDER — LORAZEPAM 1 MG PO TABS
1.0000 mg | ORAL_TABLET | Freq: Once | ORAL | Status: AC
  Administered 2015-04-16: 1 mg via ORAL
  Filled 2015-04-16: qty 1

## 2015-04-16 MED ORDER — AMLODIPINE BESYLATE 5 MG PO TABS: 5.0000 mg | ORAL_TABLET | Freq: Every day | ORAL | Status: DC

## 2015-04-16 MED ORDER — MONTELUKAST SODIUM 10 MG PO TABS
10.0000 mg | ORAL_TABLET | Freq: Every day | ORAL | Status: DC
  Administered 2015-04-16: 10 mg via ORAL
  Filled 2015-04-16: qty 1

## 2015-04-16 MED ORDER — IBUPROFEN 400 MG PO TABS
400.0000 mg | ORAL_TABLET | Freq: Two times a day (BID) | ORAL | Status: DC
  Administered 2015-04-16 – 2015-04-17 (×2): 400 mg via ORAL
  Filled 2015-04-16 (×2): qty 1

## 2015-04-16 MED ORDER — ALBUTEROL SULFATE HFA 108 (90 BASE) MCG/ACT IN AERS: 2.0000 | INHALATION_SPRAY | Freq: Four times a day (QID) | RESPIRATORY_TRACT | Status: DC

## 2015-04-16 MED ORDER — DEXTROSE 5 % IV SOLN
1.0000 g | INTRAVENOUS | Status: DC
  Administered 2015-04-16 – 2015-04-17 (×2): 1 g via INTRAVENOUS
  Filled 2015-04-16 (×2): qty 10

## 2015-04-16 MED ORDER — ASPIRIN 81 MG PO CHEW
324.0000 mg | CHEWABLE_TABLET | Freq: Once | ORAL | Status: AC
  Administered 2015-04-16: 324 mg via ORAL
  Filled 2015-04-16: qty 4

## 2015-04-16 MED ORDER — ACETAMINOPHEN 325 MG PO TABS: 650.0000 mg | ORAL_TABLET | Freq: Four times a day (QID) | ORAL | Status: DC | PRN

## 2015-04-16 MED ORDER — FUROSEMIDE 20 MG PO TABS: 20.0000 mg | ORAL_TABLET | Freq: Every day | ORAL | Status: DC

## 2015-04-16 MED ORDER — PANTOPRAZOLE SODIUM 40 MG PO TBEC
40.0000 mg | DELAYED_RELEASE_TABLET | Freq: Every day | ORAL | Status: DC
  Administered 2015-04-16 – 2015-04-17 (×2): 40 mg via ORAL
  Filled 2015-04-16 (×2): qty 1

## 2015-04-16 MED ORDER — AZITHROMYCIN 500 MG IV SOLR
500.0000 mg | Freq: Once | INTRAVENOUS | Status: AC
  Administered 2015-04-16: 500 mg via INTRAVENOUS
  Filled 2015-04-16: qty 500

## 2015-04-16 MED ORDER — ONDANSETRON HCL 4 MG/2ML IJ SOLN: 4.0000 mg | Freq: Four times a day (QID) | INTRAMUSCULAR | Status: DC | PRN

## 2015-04-16 MED ORDER — PREDNISONE 20 MG PO TABS: 30.0000 mg | ORAL_TABLET | Freq: Every day | ORAL | Status: DC

## 2015-04-16 MED ORDER — PREDNISONE 50 MG PO TABS
50.0000 mg | ORAL_TABLET | Freq: Every day | ORAL | Status: AC
  Administered 2015-04-17: 50 mg via ORAL
  Filled 2015-04-16: qty 1

## 2015-04-16 NOTE — ED Notes (Signed)
1132 called for report to 2A, First attempt.

## 2015-04-16 NOTE — Consult Note (Signed)
Reason for Consult: Right knee pain  Referring Physician: Dr. Dinah Beers   HPI: 81 year old white male. History of coronary disease and significant aortic stenosis. Last ejection fraction 40%. History of COPD. Some interstitial lung disease. A few days ago he was kneeling on his right knee. Hurt anteriorly. Not sure if it swell. Cell primary physician. Received prednisone taper. Improved. Had recent shortness of breath after eating. No cough. Seen emergency room. Interstitial infiltrate. Elevated troponin. Elevated white count though prior white counts of also been elevated. Admitted. Now getting IV antibiotics. Knee is better. Her joints have not bothered him. Occasionally triggers in his right hand. No history of psoriasis. No history of gout. No other joints are bothering him today  PMH: Aortic stenosis. Peripheral vascular disease. COPD.  SURGICAL HISTORY: Vascular procedures. Kidney stone removal.  Family History: Diabetes  Social History: Prior cigarettes. No significant alcohol  Allergies:  Allergies  Allergen Reactions  . Benazepril Hcl   . Mevacor  [Lovastatin]   . Fluvastatin Sodium Rash    Medications:  Scheduled: . albuterol  2.5 mg Nebulization Q6H  . [START ON 04/17/2015] aspirin EC  81 mg Oral Daily  . cefTRIAXone (ROCEPHIN)  IV  1 g Intravenous Q24H  . docusate sodium  100 mg Oral BID  . furosemide  20 mg Oral BID  . heparin  5,000 Units Subcutaneous 3 times per day  . ibuprofen  400 mg Oral BID  . [START ON 04/17/2015] levofloxacin  500 mg Oral Daily  . metoprolol succinate  25 mg Oral Daily  . montelukast  10 mg Oral QHS  . pantoprazole  40 mg Oral Daily  . [START ON 04/17/2015] predniSONE  50 mg Oral Q breakfast   Followed by  . [START ON 04/18/2015] predniSONE  40 mg Oral Q breakfast   Followed by  . [START ON 04/19/2015] predniSONE  30 mg Oral Q breakfast   Followed by  . [START ON 04/20/2015] predniSONE  20 mg Oral Q breakfast   Followed by  . [START ON 04/21/2015] predniSONE  10 mg Oral Q breakfast  . [START ON 04/17/2015] quinapril  20 mg Oral Daily  . simvastatin  40 mg Oral q1800  . sodium chloride  3 mL Intravenous Q12H  . verapamil  180 mg Oral QHS        ROS: No abdominal pain. No chest pain. Some tachycardia. No significant swelling in his legs.   PHYSICAL EXAM: Blood pressure 135/63, pulse 88, temperature 97.7 F (36.5 C), temperature source Oral, resp. rate 17, height  (1.702 m), weight 77.111 kg (170 lb), SpO2 93 %. Musculoskeletal exam performed. Mollies range of motion of cervical spine. Shoulders moved well. Hands with hypertrophic changes across the DIPs. Several trigger nodules. No wrist or MCP synovitis. Right knee flexes well. No definite effusion. Mild medial joint line tenderness. Mild laxity. Left knee moves well. Ankles and toes without synovitis  Assessment: Recent right knee pain after squatting. Improved after steroid taper. Not seem to have effusion today to aspirate. Suspect component of osteoarthritis. Could've had gout or inflammatory arthritis. Seemingly resolved after prednisone Recent shortness of breath. Now getting antibiotics .elevated troponin. Known decreased ejection fracture and in association with coronary disease as well as aortic stenosis  Recommendations:  x-ray right knee. Will hold on aspiration as there is no fluid and he seems to be improving If worse as outpatient then we'll be glad to reconsider injecting  Kandyce Rud 04/16/2015,  5:36 PM

## 2015-04-16 NOTE — H&P (Signed)
Calvin Carpenter is an 81 y.o. male.   Chief Complaint: Difficult to breathing HPI: The patient presents to the emergency department via EMS complaining of difficulty breathing. Upon arrival to the emergency department oxygen saturations were 93% on 100% FiO2 via nonrebreather mask. The patient was started on BiPAP and rapidly improved. By the time of admission he had been weaned to nasal cannula and was breathing comfortably.  He states that his symptoms began last night when he felt as if he had terrible indigestion. He states he had to belch frequently but admits to substernal chest pain at that time radiated to the side of his chest. At present he denies chest pain. His pain was not associated with nausea, vomiting or diaphoresis although he admits to some shortness of breath. Nonetheless, he went to church that evening but became progressively more dyspneic overnight and this morning which prompted evaluation in the emergency department. Chest x-ray revealed acute on chronic bilateral infiltrates. Due to his initial respiratory failure and current mild oxygen requirement emergency department staff called for admission.  Past Medical History  Diagnosis Date  . Myocardial infarct ([place]) 04-23-13  . History of chicken pox   . History of measles   . History of mumps   . CHF (congestive heart failure) ([place])   . C. difficile colitis 12/02/2014    following prolonged antibiotic treatment for post op pneumonia     Past Surgical History  Procedure Laterality Date  . Groin surgery  2011  . Carotid endarterectomy Right 2007    Sankar  . Vascular surgery Bilateral     Aortic aneurysm stent, right femoral-left femoral bypass  . Hernia repair Left 10-29-12 and 04-23-13    inguinal  . Femoral bypass  09/15/2010    Fem-Fem Bypass; Dr. Hulda Humphrey, [facility]. Bilateral with femoral endartectomies. Excised graft June 2012 dur to infection  . Abdominal aortic aneurysm repair  2009  . Cataract extraction      Right eye  2001; Left eye 2000  . Prostate surgery  1993  . Kidney stone extraction  1980  . Cardiac catheterization  10/27/2005    Severe 2 vessel CAD with occluded prox RCA with collaterals. Lx occlusion. 95% OMI, 30% Mid LAD. Mild aortic stenosis  . Doppler echocardiography  09/08/2011    Mild global LV dysfunction EF=45%, MILD lvh, mild MI, TI. Severe AS    Family History  Problem Relation Age of Onset  . Diabetes Brother   . Diabetes Mother   . Ovarian cancer Mother   . Lung cancer Father    Social History:  reports that he quit smoking about 3 years ago. His smoking use included Cigarettes. He has a 65 pack-year smoking history. He has never used smokeless tobacco. He reports that he does not drink alcohol or use illicit drugs.  Allergies:  Allergies  Allergen Reactions  . Benazepril Hcl   . Mevacor  [Lovastatin]   . Fluvastatin Sodium Rash    Medications Prior to Admission  Medication Sig Dispense Refill  . albuterol (VENTOLIN HFA) 108 (90 BASE) MCG/ACT inhaler Inhale 2 puffs into the lungs every 6 (six) hours.    Marland Kitchen aspirin 81 MG tablet Take 81 mg by mouth daily.    . Diclofenac Sodium 2 % SOLN Up to 40 drops to affected area of knee four times a day as needed. 1 Bottle 0  . Fluocinonide 0.1 % CREA Apply daily as needed 30 g 1  . fluticasone (FLONASE) 50 MCG/ACT nasal  spray Place 1 spray into both nostrils as needed for allergies or rhinitis.    . furosemide (LASIX) 20 MG tablet TAKE ONE (1) TABLET EACH DAY 90 tablet 3  . HYDROcodone-acetaminophen (NORCO/VICODIN) 5-325 MG tablet Take 1 tablet by mouth every 6 (six) hours as needed for moderate pain. 30 tablet 0  . ipratropium-albuterol (DUONEB) 0.5-2.5 (3) MG/3ML SOLN Take 3 mLs by nebulization every 6 (six) hours as needed.    . metoprolol succinate (TOPROL-XL) 25 MG 24 hr tablet Take 1 tablet (25 mg total) by mouth daily. 90 tablet 1  . montelukast (SINGULAIR) 10 MG tablet Take 1 tablet (10 mg total) by mouth as needed. 90 tablet 1   . predniSONE (DELTASONE) 10 MG tablet 6 tablets for 1 day, then 5 for 1 day, then 4 for 1 day, then 3 for 1 day, then 2 for 1 day then 1 for 1 day. 21 tablet 0  . quinapril (ACCUPRIL) 20 MG tablet Take 1 tablet (20 mg total) by mouth daily. 90 tablet 1  . simvastatin (ZOCOR) 40 MG tablet Take 1 tablet (40 mg total) by mouth daily. 90 tablet 1  . verapamil (CALAN-SR) 180 MG CR tablet Take 1 tablet (180 mg total) by mouth at bedtime. 30 tablet 1    Results for orders placed or performed during the hospital encounter of 04/16/15 (from the past 48 hour(s))  CBC with Differential     Status: Abnormal   Collection Time: 04/16/15  7:40 AM  Result Value Ref Range   WBC 23.3 (H) 3.8 - 10.6 K/uL   RBC 4.75 4.40 - 5.90 MIL/uL   Hemoglobin 15.0 13.0 - 18.0 g/dL   HCT 46.2 40.0 - 52.0 %   MCV 97.2 80.0 - 100.0 fL   MCH 31.5 26.0 - 34.0 pg   MCHC 32.4 32.0 - 36.0 g/dL   RDW 13.8 11.5 - 14.5 %   Platelets 208 150 - 440 K/uL   Neutrophils Relative % 57 %   Lymphocytes Relative 34 %   Monocytes Relative 9 %   Eosinophils Relative 0 %   Basophils Relative 0 %   Neutro Abs 13.3 (H) 1.4 - 6.5 K/uL   Lymphs Abs 7.9 (H) 1.0 - 3.6 K/uL   Monocytes Absolute 2.1 (H) 0.2 - 1.0 K/uL   Eosinophils Absolute 0.0 0 - 0.7 K/uL   Basophils Absolute 0.0 0 - 0.1 K/uL   Smear Review MORPHOLOGY UNREMARKABLE   Comprehensive metabolic panel     Status: Abnormal   Collection Time: 04/16/15  7:40 AM  Result Value Ref Range   Sodium 143 135 - 145 mmol/L   Potassium 3.8 3.5 - 5.1 mmol/L   Chloride 105 101 - 111 mmol/L   CO2 25 22 - 32 mmol/L   Glucose, Bld 154 (H) 65 - 99 mg/dL   BUN 17 6 - 20 mg/dL   Creatinine, Ser 0.91 0.61 - 1.24 mg/dL   Calcium 9.6 8.9 - 10.3 mg/dL   Total Protein 7.8 6.5 - 8.1 g/dL   Albumin 4.5 3.5 - 5.0 g/dL   AST 27 15 - 41 U/L   ALT 25 17 - 63 U/L   Alkaline Phosphatase 43 38 - 126 U/L   Total Bilirubin 0.8 0.3 - 1.2 mg/dL   GFR calc non Af Amer >60 >60 mL/min   GFR calc Af Amer >60  >60 mL/min    Comment: (NOTE) The eGFR has been calculated using the CKD EPI equation. This calculation has not  been validated in all clinical situations. eGFR's persistently <60 mL/min signify possible Chronic Kidney Disease.    Anion gap 13 5 - 15  Troponin I     Status: Abnormal   Collection Time: 04/16/15  7:40 AM  Result Value Ref Range   Troponin I 0.08 (H) <0.031 ng/mL    Comment: READ BACK AND VERIFIED WITH DR Edd Fabian ON 04/16/15 AT 0945 Ashley        PERSISTENTLY INCREASED TROPONIN VALUES IN THE RANGE OF 0.04-0.49 ng/mL CAN BE SEEN IN:       -UNSTABLE ANGINA       -CONGESTIVE HEART FAILURE       -MYOCARDITIS       -CHEST TRAUMA       -ARRYHTHMIAS       -LATE PRESENTING MYOCARDIAL INFARCTION       -COPD   CLINICAL FOLLOW-UP RECOMMENDED.   Brain natriuretic peptide     Status: Abnormal   Collection Time: 04/16/15  7:40 AM  Result Value Ref Range   B Natriuretic Peptide 679.0 (H) 0.0 - 100.0 pg/mL   Dg Chest Portable 1 View  04/16/2015  CLINICAL DATA:  Shortness of Breath EXAM: PORTABLE CHEST - 1 VIEW COMPARISON:  02/11/2015 FINDINGS: Cardiac shadow is stable. Aortic calcifications are again seen. Some very mild vascular congestion is noted. The interstitial changes are again identified and stable and may be in part chronic in nature given their lack of significant change. Diffuse increased density is noted bases bilaterally medially likely representing bibasilar infiltrates. Correlation with the clinical exam is recommended. IMPRESSION: Apparent acute on chronic bibasilar infiltrates. Electronically Signed   By: Inez Catalina M.D.   On: 04/16/2015 08:15    Review of Systems  Constitutional: Negative for fever and chills.  HENT: Negative for sore throat and tinnitus.   Eyes: Negative for blurred vision and redness.  Respiratory: Positive for cough and shortness of breath (Resolved).   Cardiovascular: Negative for chest pain, palpitations, orthopnea and PND.   Gastrointestinal: Positive for diarrhea (2 episodes). Negative for nausea, vomiting and abdominal pain.  Genitourinary: Negative for dysuria, urgency and frequency.  Musculoskeletal: Positive for joint pain (right knee). Negative for myalgias.  Skin: Negative for rash.       No lesions  Neurological: Negative for speech change, focal weakness and weakness.  Endo/Heme/Allergies: Does not bruise/bleed easily.       No temperature intolerance  Psychiatric/Behavioral: Negative for depression and suicidal ideas.    Blood pressure 133/71, pulse 79, temperature 98.2 F (36.8 C), temperature source Oral, resp. rate 17, height 5' 7"  (1.702 m), weight 77.111 kg (170 lb), SpO2 94 %. Physical Exam  Nursing note and vitals reviewed. Constitutional: He is oriented to person, place, and time. He appears well-developed and well-nourished. No distress.  HENT:  Head: Normocephalic and atraumatic.  Mouth/Throat: Oropharynx is clear and moist.  Eyes: Conjunctivae and EOM are normal. Pupils are equal, round, and reactive to light. No scleral icterus.  Neck: Normal range of motion. Neck supple. No JVD present. No tracheal deviation present. No thyromegaly present.  Cardiovascular: Normal rate, regular rhythm and normal heart sounds.  Exam reveals no gallop and no friction rub.   No murmur heard. Respiratory: He has wheezes in the left lower field.  GI: Soft. Bowel sounds are normal. He exhibits distension. There is no tenderness.  Genitourinary:  Deferred  Musculoskeletal: Normal range of motion. He exhibits no edema.       Right knee: He exhibits effusion.  Right knee is ballotable  Lymphadenopathy:    He has no cervical adenopathy.  Neurological: He is alert and oriented to person, place, and time. No cranial nerve deficit.  Skin: Skin is warm and dry. No rash noted. No erythema.  Psychiatric: He has a normal mood and affect. His behavior is normal. Judgment and thought content normal.      Assessment/Plan This is an 81 year old Caucasian male admitted for story failure with hypoxia secondary to bilateral pneumonia. 1. Pneumonia: Community-acquired. The patient's oxygen requirement has decreased dramatically. We will wean oxygen as tolerated. The patient is afebrile without leukocytosis. No indication of sepsis. He is received ceftriaxone and azithromycin in the emergency department. I will transition him to oral Levaquin prior to discharge. I have reviewed the patient's chest x-rays over the last year and it appears that he has chronic inflammation or scarring in the distribution of his current pneumonia. Also, it appears that he has COPD based on his lung volumes. He is a former smoker. He may benefit from chronic low dose steroid therapy. I have started him on a steroid taper for now but recommend daily prednisone following discharge. Albuterol as needed. Will not start Spiriva in the setting of acute illness given history of heart failure. 2. Elevated troponin: Minimal at this time. Etiology could be demand ischemia secondary to respiratory distress but it is important to keep in mind this episode began with chest pain. It sounds as if it lasted longer than typical cardiac chest pain. Nonetheless, we will follow cardiac enzymes. The patient has a history of CAD and MI status post CABG. EKG shows no signs of ischemia at this time. Cardiology consultation at the discretion of the primary team. 3. Essential hypertension: Continue metoprolol and ACE inhibitor 4. CHF: Presumably systolic; stable. Continue Lasix per home regimen 5. AAA: Stable per physical exam. Manage blood pressure 6. Atrial fibrillation: Rate controlled. Continue verapamil and aspirin  7. Right knee effusion: Ballotable; does not appear septic. Consult rheumatology 8. DVT prophylaxis: Heparin 9. GI prophylaxis: I have started the patient on a PPI based on symptoms described above The patient is a full code. Time spent  on admission orders and patient care approximately 45 minutes   Harrie Foreman 04/16/2015, 12:13 PM

## 2015-04-16 NOTE — ED Provider Notes (Signed)
[facility] Emergency Department Provider Note  ____________________________________________  Time seen: Approximately 7:40 AM  I have reviewed the triage vital signs and the nursing notes.   HISTORY  Chief Complaint Shortness of Breath    HPI STEPEN PRINS is a 81 y.o. male with congestive heart failure, diastolic, with EF of 60% as well as aortic valve stenosis; COPD, non-O2 dependent and hypertension who awoke from sleep this morning with sudden onset shortness of breath, constant since onset, severe, no modifying factors. Patient reports that he has been hearing some "gurgling in his chest" over the past few days. This morning he awoke with severe shortness of breath, no chest pain. On EMS arrival, he was hypoxic but O2 sat improved to 93-94% on nonrebreather. He reports this has happened to him before in the setting of a CHF exacerbation. No cough. No vomiting.   Past Medical History  Diagnosis Date  . Myocardial infarct ([facility]) 04-23-13  . History of chicken pox   . History of measles   . History of mumps   . CHF (congestive heart failure) ([facility])   . C. difficile colitis 12/02/2014    following prolonged antibiotic treatment for post op pneumonia     Patient Active Problem List   Diagnosis Date Noted  . Hyperglycemia 01/30/2015  . Aortic valve disorder 12/02/2014  . Carotid arterial disease ([facility]) 12/02/2014  . Insomnia 12/02/2014  . Peripheral vascular disease ([facility]) 12/02/2014  . Shoulder strain 12/02/2014  . COPD (chronic obstructive pulmonary disease) ([facility]) 12/02/2014  . Chronic diastolic heart failure ([facility]) 10/24/2014  . Cardiomyopathy, ischemic 08/28/2014  . A-fib ([facility]) 08/28/2014  . Myocardial ischemia 08/28/2014  . Anemia 05/16/2013  . Rotator cuff syndrome 10/28/2009  . CHF (congestive heart failure) ([facility]) 06/26/2008  . Arteriosclerosis of coronary artery 10/27/2005  . Hemorrhoids without complication 03/21/2005  . Allergic rhinitis  10/29/2001  . AAA (abdominal aortic aneurysm) ([facility]) 08/16/1996  . GERD (gastroesophageal reflux disease) 05/23/1994  . HLD (hyperlipidemia) 05/23/1994  . Essential (primary) hypertension 05/23/1994  . Former smoker 05/23/1994    Past Surgical History  Procedure Laterality Date  . Groin surgery  2011  . Carotid endarterectomy Right 2007    Sankar  . Vascular surgery Bilateral     Aortic aneurysm stent, right femoral-left femoral bypass  . Hernia repair Left 10-29-12 and 04-23-13    inguinal  . Femoral bypass  09/15/2010    Fem-Fem Bypass; Dr. Earnestine Leys, [facility]. Bilateral with femoral endartectomies. Excised graft June 2012 dur to infection  . Abdominal aortic aneurysm repair  2009  . Cataract extraction      Right eye 2001; Left eye 2000  . Prostate surgery  1993  . Kidney stone extraction  1980  . Cardiac catheterization  10/27/2005    Severe 2 vessel CAD with occluded prox RCA with collaterals. Lx occlusion. 95% OMI, 30% Mid LAD. Mild aortic stenosis  . Doppler echocardiography  09/08/2011    Mild global LV dysfunction EF=45%, MILD lvh, mild MI, TI. Severe AS    Current Outpatient Rx  Name  Route  Sig  Dispense  Refill  . albuterol (VENTOLIN HFA) 108 (90 BASE) MCG/ACT inhaler   Inhalation   Inhale 2 puffs into the lungs every 6 (six) hours.         Marland Kitchen amLODipine (NORVASC) 5 MG tablet   Oral   Take 5 mg by mouth daily.         Marland Kitchen aspirin 81 MG tablet  Oral   Take 81 mg by mouth daily.         . Diclofenac Sodium 2 % SOLN      Up to 40 drops to affected area of knee four times a day as needed.   1 Bottle   0   . Fluocinonide 0.1 % CREA      Apply daily as needed   30 g   1   . fluticasone (FLONASE) 50 MCG/ACT nasal spray   Each Nare   Place 1 spray into both nostrils as needed for allergies or rhinitis.         . furosemide (LASIX) 20 MG tablet      TAKE ONE (1) TABLET EACH DAY   90 tablet   3   . HYDROcodone-acetaminophen (NORCO/VICODIN) 5-325 MG  tablet   Oral   Take 1 tablet by mouth every 6 (six) hours as needed for moderate pain.   30 tablet   0   . ibuprofen (ADVIL,MOTRIN) 400 MG tablet   Oral   Take 1 tablet (400 mg total) by mouth 2 (two) times daily.   30 tablet   1   . ipratropium-albuterol (DUONEB) 0.5-2.5 (3) MG/3ML SOLN   Nebulization   Take 3 mLs by nebulization every 6 (six) hours as needed.         . metoprolol succinate (TOPROL-XL) 25 MG 24 hr tablet   Oral   Take 1 tablet (25 mg total) by mouth daily.   90 tablet   1   . montelukast (SINGULAIR) 10 MG tablet   Oral   Take 1 tablet (10 mg total) by mouth as needed.   90 tablet   1   . predniSONE (DELTASONE) 10 MG tablet      6 tablets for 1 day, then 5 for 1 day, then 4 for 1 day, then 3 for 1 day, then 2 for 1 day then 1 for 1 day.   21 tablet   0   . quinapril (ACCUPRIL) 20 MG tablet   Oral   Take 1 tablet (20 mg total) by mouth daily.   90 tablet   1   . simvastatin (ZOCOR) 40 MG tablet   Oral   Take 1 tablet (40 mg total) by mouth daily.   90 tablet   1   . verapamil (CALAN-SR) 180 MG CR tablet   Oral   Take 1 tablet (180 mg total) by mouth at bedtime.   30 tablet   1     Allergies Benazepril hcl; Mevacor ; and Fluvastatin sodium  Family History  Problem Relation Age of Onset  . Diabetes Brother   . Diabetes Mother   . Ovarian cancer Mother   . Lung cancer Father     Social History Social History  Substance Use Topics  . Smoking status: Former Smoker -- 1.00 packs/day for 65 years    Types: Cigarettes    Quit date: 04/26/2011  . Smokeless tobacco: Never Used  . Alcohol Use: No    Review of Systems Constitutional: No fever/chills Eyes: No visual changes. ENT: No sore throat. Cardiovascular: Denies chest pain. Respiratory: +shortness of breath. Gastrointestinal: No abdominal pain.  No nausea, no vomiting.  No diarrhea.  No constipation. Genitourinary: Negative for dysuria. Musculoskeletal: Negative for back  pain. Skin: Negative for rash. Neurological: Negative for headaches, focal weakness or numbness.  10-point ROS otherwise negative.  ____________________________________________   PHYSICAL EXAM:  VITAL SIGNS: ED Triage Vitals  Enc  Vitals Group     BP --      Pulse Rate 04/16/15 0735 118     Resp 04/16/15 0735 21     Temp --      Temp src --      SpO2 04/16/15 0733 93 %     Weight 04/16/15 0735 170 lb (77.111 kg)     Height 04/16/15 0735 5\' 7"  (1.702 m)     Head Cir --      Peak Flow --      Pain Score --      Pain Loc --      Pain Edu? --      Excl. in GC? --     Constitutional: Alert and oriented. Speaking in short sentences. In moderate respiratory distress. Eyes: Conjunctivae are normal. PERRL. EOMI. Head: Atraumatic. Nose: No congestion/rhinnorhea. Mouth/Throat: Mucous membranes are moist.  Oropharynx non-erythematous. Neck: No stridor.  Cardiovascular: Tachycardic rate, regular rhythm. Grossly normal heart sounds.  Good peripheral circulation. Respiratory: Tachypnea with increased work of breathing, diminished breath sounds throughout all lung fields with faint crackles in the bases. Gastrointestinal: Soft and nontender. No distention.No CVA tenderness. Genitourinary: deferred Musculoskeletal: Trace pitting edema bilateral lower extremities.  No joint effusions. Neurologic:  Normal speech and language. No gross focal neurologic deficits are appreciated. No gait instability. Skin:  Skin is warm, dry and intact. No rash noted. Psychiatric: Mood and affect are normal. Speech and behavior are normal.  ____________________________________________   LABS (all labs ordered are listed, but only abnormal results are displayed)  Labs Reviewed  CBC WITH DIFFERENTIAL/PLATELET - Abnormal; Notable for the following:    WBC 23.3 (*)    All other components within normal limits  COMPREHENSIVE METABOLIC PANEL - Abnormal; Notable for the following:    Glucose, Bld 154 (*)     All other components within normal limits  TROPONIN I - Abnormal; Notable for the following:    Troponin I 0.08 (*)    All other components within normal limits  BRAIN NATRIURETIC PEPTIDE - Abnormal; Notable for the following:    B Natriuretic Peptide 679.0 (*)    All other components within normal limits  CULTURE, BLOOD (ROUTINE X 2)  CULTURE, BLOOD (ROUTINE X 2)    ____________________________________________  EKG  ED ECG REPORT I, Gayla DossGayle, [place] A, the attending physician, personally viewed and interpreted this ECG.   Date: 04/16/2015  EKG Time: 07:45  Rate: 101  Rhythm: sinus tachycardia  Axis: normal  Intervals:none  ST&T Change: No acute ST or elevation.  ____________________________________________  RADIOLOGY  CXR FINDINGS: Cardiac shadow is stable. Aortic calcifications are again seen. Some very mild vascular congestion is noted. The interstitial changes are again identified and stable and may be in part chronic in nature given their lack of significant change. Diffuse increased density is noted bases bilaterally medially likely representing bibasilar infiltrates. Correlation with the clinical exam is recommended.  IMPRESSION: Apparent acute on chronic bibasilar infiltrates.   ____________________________________________   PROCEDURES  Procedure(s) performed: None  Critical Care performed: Yes, see critical care note(s). Total critical care time spent 30 minutes.  ____________________________________________   INITIAL IMPRESSION / ASSESSMENT AND PLAN / ED COURSE  Pertinent labs & imaging results that were available during my care of the patient were reviewed by me and considered in my medical decision making (see chart for details).  Earna CoderOtis R Wimmer is a [age] y.o. male with congestive heart failure, diastolic, with EF of 60% as well as aortic  valve stenosis; COPD, non-O2 dependent and hypertension who awoke from sleep this morning with sudden onset  shortness of breath. On arrival to the emergency department, he is in moderate respiratory distress, speaking in short sentences, globally diminished breath sounds with faint rales in the bases. EKG not consistent with STEMI though ST depression is noted in V4 through V6. Patient placed on BiPAP with significant improvement in his work of breathing. Plan for labs, chest x-ray, anticipate admission.    ----------------------------------------- 9:48 AM on 04/16/2015 -----------------------------------------  Patient with significant improvement in his work of breathing, now off BiPAP. Chest x-ray concerning for acute on chronic bibasilar infiltrates, suspect pneumonia. We'll give IV ceftriaxone and azithromycin for likely community-acquired pneumonia. Troponin is elevated at 0.08, question NSTEMI versus demand ischemia. BNP elevated at 679, chest x-ray showing only mild vascular congestion, no interstitial edema. Aspirin ordered. Case discussed with the hospitalist, Dr. Angelique Blonder, for admission. ____________________________________________   FINAL CLINICAL IMPRESSION(S) / ED DIAGNOSES  Final diagnoses:  CAP (community acquired pneumonia)  Respiratory failure with hypoxia, unspecified chronicity ([facility])  NSTEMI (non-ST elevated myocardial infarction) ([facility])      Gayla Doss, MD 04/16/15 [phone]

## 2015-04-16 NOTE — ED Notes (Signed)
Pt arrived from home by EMS with reports of waking up this AM with trouble breathing. EMS reports giving i sublingual nitro spray in route, 18g in left AC, lung sounds wet. EMS states pt has needed bi-pap in past but never intubated. On assessment at triage pt denies chest pain. Pt has HX of COPD.

## 2015-04-16 NOTE — ED Notes (Signed)
Pt arrived by EMS on non-re breather mask, O2 (93), pt placed on bi-pap machine in ED, O2 (96).

## 2015-04-16 NOTE — ED Notes (Signed)
Pt switched from bi-pap to nasal cannula at 4L. O2 (98)

## 2015-04-17 MED ORDER — FUROSEMIDE 10 MG/ML IJ SOLN
40.0000 mg | Freq: Once | INTRAMUSCULAR | Status: AC
  Administered 2015-04-17: 40 mg via INTRAVENOUS
  Filled 2015-04-17: qty 4

## 2015-04-17 MED ORDER — PREDNISONE 10 MG PO TABS: ORAL_TABLET | ORAL | Status: DC

## 2015-04-17 MED ORDER — LEVOFLOXACIN 500 MG PO TABS: 500.0000 mg | ORAL_TABLET | Freq: Every day | ORAL | Status: DC

## 2015-04-17 NOTE — Plan of Care (Signed)
Problem: ICU Phase Progression Outcomes Goal: Dyspnea controlled at rest Outcome: Not Progressing Pt is dyspenic with minimal exertion, oxygen increased to 4l to maintain sats above 90%

## 2015-04-17 NOTE — Progress Notes (Signed)
Discharge instructions along with home medication list and follow up gone over with patient. Patient verbalized that he understood instructions. Printed rx given to patient. Iv and telemetry removed. No sob, still slightly sob with exertion. Patient discharged home on RA, daughter to transport patient home

## 2015-04-17 NOTE — Progress Notes (Signed)
Spoke with dr. Cherlynn Kaisersainani to make aware of patients blood pressure. Per md hold am dose of metoprolol and quinapril

## 2015-04-17 NOTE — Progress Notes (Signed)
SATURATION QUALIFICATIONS: (This note is used to comply with regulatory documentation for home oxygen)  Patient Saturations on Room Air at Rest = 91%  Patient Saturations on Room Air while Ambulating = 93%  Patient Saturations on 0 Liters of oxygen while Ambulating =na%  Please briefly explain why patient needs home oxygen:

## 2015-04-22 ENCOUNTER — Ambulatory Visit
Admission: RE | Admit: 2015-04-22 | Discharge: 2015-04-22 | Disposition: A | Discharge status: Home / Self Care | Payer: PPO | Source: Ambulatory Visit | Attending: Family Medicine | Admitting: Family Medicine

## 2015-04-22 ENCOUNTER — Encounter: Payer: Self-pay | Admitting: Family Medicine

## 2015-04-22 ENCOUNTER — Ambulatory Visit (INDEPENDENT_AMBULATORY_CARE_PROVIDER_SITE_OTHER): Payer: PPO | Admitting: Family Medicine

## 2015-04-22 VITALS — BP 132/70 | HR 95 | Temp 98.0°F | Resp 18 | Wt 159.0 lb

## 2015-04-22 DIAGNOSIS — J189 Pneumonia, unspecified organism: Secondary | ICD-10-CM | POA: Diagnosis not present

## 2015-04-22 DIAGNOSIS — Z09 Encounter for follow-up examination after completed treatment for conditions other than malignant neoplasm: Secondary | ICD-10-CM | POA: Insufficient documentation

## 2015-04-22 DIAGNOSIS — I10 Essential (primary) hypertension: Secondary | ICD-10-CM

## 2015-04-22 LAB — CULTURE, BLOOD (ROUTINE X 2)
CULTURE: NO GROWTH
Culture: NO GROWTH

## 2015-04-22 MED ORDER — VERAPAMIL HCL ER 180 MG PO TBCR: 180.0000 mg | EXTENDED_RELEASE_TABLET | Freq: Every day | ORAL | Status: DC

## 2015-04-22 NOTE — Progress Notes (Signed)
Patient: Calvin Carpenter Male    DOB: 1930-12-28   81 y.o.   MRN: [id number] Visit Date: 04/22/2015  Today's Provider: Mila Merryonald Jhett Fretwell, MD   Chief Complaint  Patient presents with  . Hospitalization Follow-up  . Pneumonia    recheck   Subjective:    HPI   Follow up Hospitalization  Patient was admitted to [facility] on 04/16/2015 and discharged on 04/17/2015. He was treated for Pneumonia. Treatment for this included Prednisone taper and Levofloxacin. He reports good compliance with treatment. He reports this condition is Improved. Patient states since being discharged from the hospital, his breathing has improved. Patient states right now he feels better than he has been feeling in the past few weeks. Patient denies any fevers. Patient took the last dose of Levofloxacin this morning and feels much better than when [facility]  ------------------------------------------------------------------------------------      Allergies  Allergen Reactions  . Benazepril Hcl   . Mevacor  [Lovastatin]   . Fluvastatin Sodium Rash   Previous Medications   ALBUTEROL (VENTOLIN HFA) 108 (90 BASE) MCG/ACT INHALER    Inhale 2 puffs into the lungs every 6 (six) hours.   ASPIRIN 81 MG TABLET    Take 81 mg by mouth daily.   DICLOFENAC SODIUM 2 % SOLN    Up to 40 drops to affected area of knee four times a day as needed.   FLUOCINONIDE 0.1 % CREA    Apply daily as needed   FLUTICASONE (FLONASE) 50 MCG/ACT NASAL SPRAY    Place 1 spray into both nostrils as needed for allergies or rhinitis.   FUROSEMIDE (LASIX) 20 MG TABLET    TAKE ONE (1) TABLET EACH DAY   HYDROCODONE-ACETAMINOPHEN (NORCO/VICODIN) 5-325 MG TABLET    Take 1 tablet by mouth every 6 (six) hours as needed for moderate pain.   IPRATROPIUM-ALBUTEROL (DUONEB) 0.5-2.5 (3) MG/3ML SOLN    Take 3 mLs by nebulization every 6 (six) hours as needed.   LEVOFLOXACIN (LEVAQUIN) 500 MG TABLET    Take 1 tablet (500  mg total) by mouth daily.   METOPROLOL SUCCINATE (TOPROL-XL) 25 MG 24 HR TABLET    Take 1 tablet (25 mg total) by mouth daily.   MONTELUKAST (SINGULAIR) 10 MG TABLET    Take 1 tablet (10 mg total) by mouth as needed.   PREDNISONE (DELTASONE) 10 MG TABLET    Label  & dispense according to the schedule below. 5 Pills PO for 1 day then, 4 Pills PO for 1 day, 3 Pills PO for 1 day, 2 Pills PO for 1 day, 1 Pill PO for 1 days then STOP.   QUINAPRIL (ACCUPRIL) 20 MG TABLET    Take 1 tablet (20 mg total) by mouth daily.   SIMVASTATIN (ZOCOR) 40 MG TABLET    Take 1 tablet (40 mg total) by mouth daily.   VERAPAMIL (CALAN-SR) 180 MG CR TABLET    Take 1 tablet (180 mg total) by mouth at bedtime.    Review of Systems  Constitutional: Positive for fatigue. Negative for fever, chills and appetite change.  HENT: Negative for congestion, dental problem, drooling, ear discharge, ear pain, facial swelling, hearing loss, mouth sores, nosebleeds, postnasal drip, rhinorrhea, sinus pressure, sneezing, sore throat and tinnitus.   Respiratory: Negative for cough, chest tightness, shortness of breath and wheezing.   Cardiovascular: Negative for chest pain and palpitations.  Gastrointestinal: Negative for nausea, vomiting and abdominal pain.  Neurological:  Positive for light-headedness (occasionally). Negative for dizziness and headaches.    Social History  Substance Use Topics  . Smoking status: Former Smoker -- 1.00 packs/day for 65 years    Types: Cigarettes    Quit date: 04/26/2011  . Smokeless tobacco: Never Used  . Alcohol Use: No   Objective:   BP 132/70 mmHg  Pulse 95  Temp(Src) 98 F (36.7 C) (Oral)  Resp 18  Wt 159 lb (72.122 kg)  SpO2 95%  Physical Exam   General Appearance:    Alert, cooperative, no distress  Eyes:    PERRL, conjunctiva/corneas clear, EOM's intact       Lungs:     Clear to auscultation bilaterally, respirations unlabored  Heart:    Regular rate and rhythm. II/VI systolic  murmur. No edema.   Neurologic:   Awake, alert, oriented x 3. No apparent focal neurological           defect.           Assessment & Plan:     1. Pneumonia of lower lobe of lung, unspecified laterality Greatly improved. Has finished levoquin,. Repeat XR today to ensure radiological clearing.  - DG Chest 2 View; Future  2. Essential (primary) hypertension Well controlled.  Continue current medications.  Needs refill.  - verapamil (CALAN-SR) 180 MG CR tablet; Take 1 tablet (180 mg total) by mouth at bedtime.  Dispense: 30 tablet; Refill: 1       Mila Merry, MD  [facility]

## 2015-04-23 ENCOUNTER — Telehealth: Payer: Self-pay | Admitting: Family Medicine

## 2015-04-23 NOTE — Telephone Encounter (Signed)
Pt calling in with info for HTA for his new mail order pharmacy.    [facility] Fax [phone]  This will be starting Jan. 1  Thanks , Fortune Brandsteri

## 2015-04-23 NOTE — Telephone Encounter (Signed)
New pharmacy was entered into chart.

## 2015-04-28 ENCOUNTER — Ambulatory Visit: Payer: PPO | Attending: Family | Admitting: Family

## 2015-04-28 ENCOUNTER — Encounter: Payer: Self-pay | Admitting: Family

## 2015-04-28 VITALS — BP 142/67 | HR 86 | Resp 18 | Ht 67.0 in | Wt 162.0 lb

## 2015-04-28 DIAGNOSIS — I252 Old myocardial infarction: Secondary | ICD-10-CM | POA: Insufficient documentation

## 2015-04-28 DIAGNOSIS — Z8619 Personal history of other infectious and parasitic diseases: Secondary | ICD-10-CM | POA: Insufficient documentation

## 2015-04-28 DIAGNOSIS — Z7982 Long term (current) use of aspirin: Secondary | ICD-10-CM | POA: Diagnosis not present

## 2015-04-28 DIAGNOSIS — Z79899 Other long term (current) drug therapy: Secondary | ICD-10-CM | POA: Diagnosis not present

## 2015-04-28 DIAGNOSIS — Z87891 Personal history of nicotine dependence: Secondary | ICD-10-CM | POA: Insufficient documentation

## 2015-04-28 DIAGNOSIS — I480 Paroxysmal atrial fibrillation: Secondary | ICD-10-CM | POA: Diagnosis not present

## 2015-04-28 DIAGNOSIS — I110 Hypertensive heart disease with heart failure: Secondary | ICD-10-CM | POA: Diagnosis not present

## 2015-04-28 DIAGNOSIS — Z87898 Personal history of other specified conditions: Secondary | ICD-10-CM | POA: Diagnosis not present

## 2015-04-28 DIAGNOSIS — Z9889 Other specified postprocedural states: Secondary | ICD-10-CM | POA: Insufficient documentation

## 2015-04-28 DIAGNOSIS — I10 Essential (primary) hypertension: Secondary | ICD-10-CM

## 2015-04-28 DIAGNOSIS — I5032 Chronic diastolic (congestive) heart failure: Secondary | ICD-10-CM

## 2015-04-28 DIAGNOSIS — Z888 Allergy status to other drugs, medicaments and biological substances status: Secondary | ICD-10-CM | POA: Diagnosis not present

## 2015-04-28 NOTE — Discharge Summary (Signed)
[facility] [place] at [facility]   PATIENT NAME: Alison Breeding    MR#:  [id number]  DATE OF BIRTH:  [birth year]  DATE OF ADMISSION:  04/16/2015 ADMITTING PHYSICIAN: Arnaldo Natal, MD  DATE OF DISCHARGE: 04/17/2015 12:01 PM  PRIMARY CARE PHYSICIAN: Mila Merry, MD    ADMISSION DIAGNOSIS:  CAP (community acquired pneumonia) [J18.9] NSTEMI (non-ST elevated myocardial infarction) (HCC) [I21.4] Respiratory failure with hypoxia, unspecified chronicity (HCC) [J96.91]  DISCHARGE DIAGNOSIS:  Active Problems:   Respiratory failure with hypoxia (HCC)   SECONDARY DIAGNOSIS:   Past Medical History  Diagnosis Date  . Myocardial infarct (HCC) 04-23-13  . History of chicken pox   . History of measles   . History of mumps   . CHF (congestive heart failure) (HCC)   . C. difficile colitis 12/02/2014    following prolonged antibiotic treatment for post op pneumonia     HOSPITAL COURSE:  82 yo male w/ hx of previous MI, CAD, hx of CHF, hx of A. Fib, hx of AAA, OA w/ right knee effusion, HTN who presented to the hospital due to shortness of breath and hypoxia and noted to have pneumonia.   1. Pneumonia - this was the cause of pt's shortness of breath, hypoxia.  He was admitted to the hospital and started on treatment for CAP with Levaquin.  - he also has a hx of COPD and therefore was started on Oral steroids.  After aggressive therapy his symptoms have improved and he is presently being discharged on a Oral Pred. Taper and Oral Levaquin for a few days.  - he has been afebrile, hemodynamically stable for the past 24 hrs.    2. COPD - mild acute exacerbation due to pneumonia.  - he was started on a Oral Pred. Taper and is being discharged on that.  - he will cont. Albuterol inhaler as needed, duonebs, singulair  3. Hx of CHF - hx of chronic systolic CHF.  - while in the hospital he was not in CHF.  - he will cont. His lasix, B-blocker.   4. Elevated  Troponin - likely in the setting of demand ischemia from hypoxia/pneumonia.  - troponin's trended down and he had no chest pain.  - he will cont. His ASA, Statin, B-blocker and follow up with his Cardiologist.   5. Right knee pain/effusion - seen by Rheumatology and as per them he did not need any injection.  They obtained X-ray which showed no bony abnormality.  - will cont. Follow up as outpatient.   6. Hx of chronic a. Fib - cont. Metoprolol, Verapamil.  - pt. Is not on long term anti-coagulation due to hx of AAA and high bleeding risk.   7. HTN - patient will resume his home meds.   Pt. Is being discharged home.   DISCHARGE CONDITIONS:   Stable  CONSULTS OBTAINED:  Treatment Team:  Kandyce Rud., MD  DRUG ALLERGIES:   Allergies  Allergen Reactions  . Benazepril Hcl   . Mevacor  [Lovastatin]   . Fluvastatin Sodium Rash    DISCHARGE MEDICATIONS:   Discharge Medication List as of 04/17/2015 11:11 AM    START taking these medications   Details  levofloxacin (LEVAQUIN) 500 MG tablet Take 1 tablet (500 mg total) by mouth daily., Starting 04/17/2015, Until Discontinued, Print      CONTINUE these medications which have CHANGED   Details  predniSONE (DELTASONE) 10 MG tablet Label  & dispense according to the  schedule below. 5 Pills PO for 1 day then, 4 Pills PO for 1 day, 3 Pills PO for 1 day, 2 Pills PO for 1 day, 1 Pill PO for 1 days then STOP., Print      CONTINUE these medications which have NOT CHANGED   Details  albuterol (VENTOLIN HFA) 108 (90 BASE) MCG/ACT inhaler Inhale 2 puffs into the lungs every 6 (six) hours., Starting 09/04/2014, Until Discontinued, Historical Med    aspirin 81 MG tablet Take 81 mg by mouth daily., Until Discontinued, Historical Med    Diclofenac Sodium 2 % SOLN Up to 40 drops to affected area of knee four times a day as needed., Print    Fluocinonide 0.1 % CREA Apply daily as needed, Print    fluticasone (FLONASE) 50 MCG/ACT  nasal spray Place 1 spray into both nostrils as needed for allergies or rhinitis., Until Discontinued, Historical Med    furosemide (LASIX) 20 MG tablet TAKE ONE (1) TABLET EACH DAY, Normal    HYDROcodone-acetaminophen (NORCO/VICODIN) 5-325 MG tablet Take 1 tablet by mouth every 6 (six) hours as needed for moderate pain., Starting 04/10/2015, Until Discontinued, Print    ipratropium-albuterol (DUONEB) 0.5-2.5 (3) MG/3ML SOLN Take 3 mLs by nebulization every 6 (six) hours as needed., Until Discontinued, Historical Med    metoprolol succinate (TOPROL-XL) 25 MG 24 hr tablet Take 1 tablet (25 mg total) by mouth daily., Starting 12/02/2014, Until Discontinued, Print    montelukast (SINGULAIR) 10 MG tablet Take 1 tablet (10 mg total) by mouth as needed., Starting 12/02/2014, Until Discontinued, Print    quinapril (ACCUPRIL) 20 MG tablet Take 1 tablet (20 mg total) by mouth daily., Starting 12/02/2014, Until Discontinued, Print    simvastatin (ZOCOR) 40 MG tablet Take 1 tablet (40 mg total) by mouth daily., Starting 12/02/2014, Until Discontinued, Print    verapamil (CALAN-SR) 180 MG CR tablet Take 1 tablet (180 mg total) by mouth at bedtime., Starting 02/25/2015, Until Discontinued, Normal      STOP taking these medications     ibuprofen (ADVIL,MOTRIN) 400 MG tablet          DISCHARGE INSTRUCTIONS:   DIET:  Cardiac diet  DISCHARGE CONDITION:  Stable  ACTIVITY:  Activity as tolerated  OXYGEN:  Home Oxygen: No.   Oxygen Delivery: room air  DISCHARGE LOCATION:  home   If you experience worsening of your admission symptoms, develop shortness of breath, life threatening emergency, suicidal or homicidal thoughts you must seek medical attention immediately by calling 911 or calling your MD immediately  if symptoms less severe.  You Must read complete instructions/literature along with all the possible adverse reactions/side effects for all the Medicines you take and that have been  prescribed to you. Take any new Medicines after you have completely understood and accpet all the possible adverse reactions/side effects.   Please note  You were cared for by a hospitalist during your hospital stay. If you have any questions about your discharge medications or the care you received while you were in the hospital after you are discharged, you can call the unit and asked to speak with the hospitalist on call if the hospitalist that took care of you is not available. Once you are discharged, your primary care physician will handle any further medical issues. Please note that NO REFILLS for any discharge medications will be authorized once you are discharged, as it is imperative that you return to your primary care physician (or establish a relationship with a primary care  physician if you do not have one) for your aftercare needs so that they can reassess your need for medications and monitor your lab values.     Today   Shortness of breath much improved. No fever.  No chest pain.   VITAL SIGNS:  Blood pressure 112/62, pulse 84, temperature 98.4 F (36.9 C), temperature source Oral, resp. rate 18, height 5\' 7"  (1.702 m), weight 72.303 kg (159 lb 6.4 oz), SpO2 93 %.  I/O:  No intake or output data in the 24 hours ending 04/28/15 0940  PHYSICAL EXAMINATION:  GENERAL:  82 y.o.-year-old patient lying in the bed in no acute distress.  EYES: Pupils equal, round, reactive to light and accommodation. No scleral icterus. Extraocular muscles intact.  HEENT: Head atraumatic, normocephalic. Oropharynx and nasopharynx clear.  NECK:  Supple, no jugular venous distention. No thyroid enlargement, no tenderness.  LUNGS: Normal breath sounds bilaterally, no wheezing, rales,rhonchi. No use of accessory muscles of respiration.  CARDIOVASCULAR: S1, S2 normal. No murmurs, rubs, or gallops.  ABDOMEN: Soft, non-tender, non-distended. Bowel sounds present. No organomegaly or mass.  EXTREMITIES: No  pedal edema, cyanosis, or clubbing.  NEUROLOGIC: Cranial nerves II through XII are intact. No focal motor or sensory defecits b/l.  PSYCHIATRIC: The patient is alert and oriented x 3. Good affect.  SKIN: No obvious rash, lesion, or ulcer.   DATA REVIEW:   CBC No results for input(s): WBC, HGB, HCT, PLT in the last 168 hours.  Chemistries  No results for input(s): NA, K, CL, CO2, GLUCOSE, BUN, CREATININE, CALCIUM, MG, AST, ALT, ALKPHOS, BILITOT in the last 168 hours.  Invalid input(s): GFRCGP  Cardiac Enzymes No results for input(s): TROPONINI in the last 168 hours.  Microbiology Results  Results for orders placed or performed during the hospital encounter of 04/16/15  Blood culture (routine x 2)     Status: None   Collection Time: 04/16/15  8:31 AM  Result Value Ref Range Status   Specimen Description BLOOD LEFT ARM  Final   Special Requests BOTTLES DRAWN AEROBIC AND ANAEROBIC 1CCANA,7CCAERO  Final   Culture NO GROWTH 6 DAYS  Final   Report Status 04/22/2015 FINAL  Final  Blood culture (routine x 2)     Status: None   Collection Time: 04/16/15  8:31 AM  Result Value Ref Range Status   Specimen Description BLOOD RIGHT ASSIST CONTROL  Final   Special Requests BOTTLES DRAWN AEROBIC AND ANAEROBIC 5CCANA,7CCAERO  Final   Culture NO GROWTH 6 DAYS  Final   Report Status 04/22/2015 FINAL  Final    RADIOLOGY:  No results found.    Management plans discussed with the patient, family and they are in agreement.  CODE STATUS:   TOTAL TIME TAKING CARE OF THIS PATIENT: 40 minutes.    Houston Siren M.D on 04/28/2015 at 9:40 AM  Between 7am to 6pm - Pager - [phone]  After 6pm go to [email or web address] - password [email or web address] [facility]  [place] [facility]  Office  [phone]  CC: Primary care physician; Mila Merry, MD

## 2015-04-28 NOTE — Progress Notes (Signed)
Subjective:    Patient ID: Calvin Carpenter, male    DOB: [birth year], 82 y.o.   MRN: [id number]  Congestive Heart Failure Presents for follow-up visit. The disease course has been stable. Associated symptoms include fatigue and shortness of breath. Pertinent negatives include no abdominal pain, chest pain, edema, orthopnea, palpitations or paroxysmal nocturnal dyspnea. The symptoms have been stable. Past treatments include ACE inhibitors, beta blockers and salt and fluid restriction. The treatment provided significant relief. Compliance with prior treatments has been good. His past medical history is significant for CAD and HTN. There is no history of CVA. Compliance with total regimen is 76-100%.  Hypertension This is a chronic problem. The current episode started more than 1 year ago. The problem is unchanged. The problem is controlled. Associated symptoms include shortness of breath. Pertinent negatives include no blurred vision, chest pain, headaches, palpitations or peripheral edema. There are no associated agents to hypertension. Risk factors for coronary artery disease include male gender. Past treatments include ACE inhibitors, beta blockers, diuretics and lifestyle changes. The current treatment provides significant improvement. There are no compliance problems.  Hypertensive end-organ damage includes heart failure.   Past Medical History  Diagnosis Date  . Myocardial infarct (HCC) 04-23-13  . History of chicken pox   . History of measles   . History of mumps   . CHF (congestive heart failure) (HCC)   . C. difficile colitis 12/02/2014    following prolonged antibiotic treatment for post op pneumonia     Past Surgical History  Procedure Laterality Date  . Groin surgery  2011  . Carotid endarterectomy Right 2007    Sankar  . Vascular surgery Bilateral     Aortic aneurysm stent, right femoral-left femoral bypass  . Hernia repair Left 10-29-12 and 04-23-13    inguinal  . Femoral bypass   09/15/2010    Fem-Fem Bypass; Dr. Earnestine Leys, [facility]. Bilateral with femoral endartectomies. Excised graft June 2012 dur to infection  . Abdominal aortic aneurysm repair  2009  . Cataract extraction      Right eye 2001; Left eye 2000  . Prostate surgery  1993  . Kidney stone extraction  1980  . Cardiac catheterization  10/27/2005    Severe 2 vessel CAD with occluded prox RCA with collaterals. Lx occlusion. 95% OMI, 30% Mid LAD. Mild aortic stenosis  . Doppler echocardiography  09/08/2011    Mild global LV dysfunction EF=45%, MILD lvh, mild MI, TI. Severe AS    Family History  Problem Relation Age of Onset  . Diabetes Brother   . Diabetes Mother   . Ovarian cancer Mother   . Lung cancer Father     Social History  Substance Use Topics  . Smoking status: Former Smoker -- 1.00 packs/day for 65 years    Types: Cigarettes    Quit date: 04/26/2011  . Smokeless tobacco: Never Used  . Alcohol Use: No    Allergies  Allergen Reactions  . Benazepril Hcl   . Mevacor  [Lovastatin]   . Fluvastatin Sodium Rash    Prior to Admission medications   Medication Sig Start Date End Date Taking? Authorizing Provider  albuterol (VENTOLIN HFA) 108 (90 BASE) MCG/ACT inhaler Inhale 2 puffs into the lungs every 6 (six) hours. 09/04/14  Yes Historical Provider, MD  aspirin 81 MG tablet Take 81 mg by mouth daily.   Yes Historical Provider, MD  Diclofenac Sodium 2 % SOLN Up to 40 drops to affected area of knee four times a  day as needed. 04/10/15  Yes Malva Limes, MD  Fluocinonide 0.1 % CREA Apply daily as needed 12/02/14  Yes Malva Limes, MD  fluticasone [facility]) 50 MCG/ACT nasal spray Place 1 spray into both nostrils as needed for allergies or rhinitis.   Yes Historical Provider, MD  furosemide (LASIX) 20 MG tablet TAKE ONE (1) TABLET EACH DAY 03/24/15  Yes Malva Limes, MD  HYDROcodone-acetaminophen (NORCO/VICODIN) 5-325 MG tablet Take 1 tablet by mouth every 6 (six) hours as needed for moderate  pain. 04/10/15  Yes Malva Limes, MD  ipratropium-albuterol (DUONEB) 0.5-2.5 (3) MG/3ML SOLN Take 3 mLs by nebulization every 6 (six) hours as needed.   Yes Historical Provider, MD  metoprolol succinate (TOPROL-XL) 25 MG 24 hr tablet Take 1 tablet (25 mg total) by mouth daily. 12/02/14  Yes Malva Limes, MD  montelukast (SINGULAIR) 10 MG tablet Take 1 tablet (10 mg total) by mouth as needed. 12/02/14  Yes Malva Limes, MD  quinapril (ACCUPRIL) 20 MG tablet Take 1 tablet (20 mg total) by mouth daily. 12/02/14  Yes Malva Limes, MD  simvastatin (ZOCOR) 40 MG tablet Take 1 tablet (40 mg total) by mouth daily. 12/02/14  Yes Malva Limes, MD  verapamil (CALAN-SR) 180 MG CR tablet Take 1 tablet (180 mg total) by mouth at bedtime. 04/22/15  Yes Malva Limes, MD      Review of Systems  Constitutional: Positive for fatigue. Negative for appetite change.  HENT: Negative for congestion, rhinorrhea and sore throat.   Eyes: Negative.  Negative for blurred vision.  Respiratory: Positive for shortness of breath. Negative for cough and chest tightness.   Cardiovascular: Negative for chest pain, palpitations and leg swelling.  Gastrointestinal: Negative for abdominal pain and abdominal distention.  Endocrine: Negative.   Genitourinary: Negative.   Musculoskeletal: Positive for arthralgias (right knee pain). Negative for back pain.  Skin: Negative.   Allergic/Immunologic: Negative.   Neurological: Negative for dizziness, facial asymmetry and headaches.  Hematological: Negative for adenopathy. Does not bruise/bleed easily.  Psychiatric/Behavioral: Negative for sleep disturbance (sleeping on 2 pillows) and dysphoric mood. The patient is not nervous/anxious.        Objective:   Physical Exam  Constitutional: He is oriented to person, place, and time. He appears well-developed and well-nourished.  HENT:  Head: Normocephalic and atraumatic.  Eyes: Conjunctivae are normal. Pupils are equal,  round, and reactive to light.  Neck: Normal range of motion. Neck supple.  Cardiovascular: Normal rate and regular rhythm.   Pulmonary/Chest: Effort normal. He has no wheezes. He has no rales.  Abdominal: Soft. He exhibits no distension. There is no tenderness.  Musculoskeletal: He exhibits no edema or tenderness.  Neurological: He is alert and oriented to person, place, and time.  Skin: Skin is warm and dry.  Psychiatric: He has a normal mood and affect. His behavior is normal. Thought content normal.  Nursing note and vitals reviewed.   BP 142/67 mmHg  Pulse 86  Resp 18  Ht 5\' 7"  (1.702 m)  Wt 162 lb (73.483 kg)  BMI 25.37 kg/m2  SpO2 98%       Assessment & Plan:  1: Chronic heart failure with preserved ejection fraction- Patient presents with fatigue and shortness of breath upon exertion. He was a little short of breath upon walking into the office but says that he had to park quite a distance away from the building. Once he sat down for a few minutes, his breathing  improved. He continues to weigh himself and says that his weight has been stable. By our scale, he's lost 3 pounds in the last 6 months. Reminded to call for an overnight weight gain of >2 pounds or a weekly weight gain of >5 pounds. He is using "some" salt but says that he's decreased his usage of salt. Encouraged him to follow a 2000mg  sodium diet as close as possible. He has received his flu vaccine already this year.  2: HTN- Blood pressure looks good today. Continue medications at this time. 3: Paroxysmal atrial fibrillation- No issues/concerns with this at this time. Currently in a regular rhythm and follows with cardiology.   Return here in 6 months or sooner for any questions/problems before then.

## 2015-04-28 NOTE — Patient Instructions (Addendum)
Continue weighing daily and call for an overnight weight gain of > 2 pounds or a weekly weight gain of >5 pounds. 

## 2015-05-05 ENCOUNTER — Other Ambulatory Visit: Payer: Self-pay | Admitting: Family Medicine

## 2015-05-05 DIAGNOSIS — I10 Essential (primary) hypertension: Secondary | ICD-10-CM

## 2015-05-05 MED ORDER — METOPROLOL SUCCINATE ER 25 MG PO TB24: 25.0000 mg | ORAL_TABLET | Freq: Every day | ORAL | Status: DC

## 2015-05-05 MED ORDER — SIMVASTATIN 40 MG PO TABS: 40.0000 mg | ORAL_TABLET | Freq: Every day | ORAL | Status: AC

## 2015-05-05 MED ORDER — QUINAPRIL HCL 20 MG PO TABS: 20.0000 mg | ORAL_TABLET | Freq: Every day | ORAL | Status: AC

## 2015-05-05 MED ORDER — AMLODIPINE BESYLATE 5 MG PO TABS: 5.0000 mg | ORAL_TABLET | Freq: Every day | ORAL | Status: AC

## 2015-05-05 MED ORDER — VERAPAMIL HCL ER 180 MG PO TBCR: 180.0000 mg | EXTENDED_RELEASE_TABLET | Freq: Every day | ORAL | Status: DC

## 2015-05-05 NOTE — Telephone Encounter (Signed)
Pt called back to state that he will need Amlodipine today. Thanks TNP

## 2015-05-05 NOTE — Telephone Encounter (Signed)
[facility]

## 2015-05-05 NOTE — Telephone Encounter (Signed)
All [facility]ended medications [facility]. Please advise [facility]

## 2015-05-05 NOTE — Telephone Encounter (Signed)
Pt contacted office for refill request on the following medications: 1. metoprolol succinate (TOPROL-XL) 25 MG 24 hr tablet 2. verapamil (CALAN-SR) 180 MG CR tablet 3. simvastatin (ZOCOR) 40 MG tablet 4. quinapril (ACCUPRIL) 20 MG tablet to [facility]  Pt wanted to know if he should continue taking Amlodipine 5 mg and if so [facility] needs [facility] [place]. Thanks TNP

## 2015-05-05 NOTE — Telephone Encounter (Signed)
Patient was notified.

## 2015-05-15 ENCOUNTER — Telehealth: Payer: Self-pay | Admitting: Family Medicine

## 2015-05-15 DIAGNOSIS — M25561 Pain in right knee: Secondary | ICD-10-CM

## 2015-05-15 NOTE — Telephone Encounter (Signed)
Please refer orthopedics for persistent right knee pain

## 2015-05-15 NOTE — Telephone Encounter (Signed)
Pt called states Dr Sherrie Mustache was going to send him to a specialist about his right knee. CB#[phone]/MW  I have spoke to Maralyn Sago and she has not rec'd a referral/MW

## 2015-05-15 NOTE — Telephone Encounter (Signed)
Please advise 

## 2015-05-15 NOTE — Telephone Encounter (Signed)
Calvin Carpenter with [facility] Carpenter is requesting a call back to discuss the Rx verapamil (CALAN-SR) 180 MG CR tablet.  CB#[phone]/MW

## 2015-05-15 NOTE — Telephone Encounter (Signed)
Returned [employer], LMOVM to cb.

## 2015-05-18 ENCOUNTER — Other Ambulatory Visit: Payer: Self-pay | Admitting: Family Medicine

## 2015-05-18 MED ORDER — VERAPAMIL HCL ER 180 MG PO CP24: 180.0000 mg | ORAL_CAPSULE | Freq: Every day | ORAL | Status: DC

## 2015-05-18 NOTE — Telephone Encounter (Signed)
Per Rolly Salter, the 12hr tablet was sent in instead of the 24hr capsule. Per Dr. Sherrie Mustache, ok to send in 24hr capsule of Verapamil.

## 2015-05-23 ENCOUNTER — Encounter: Payer: Self-pay | Admitting: Emergency Medicine

## 2015-05-23 ENCOUNTER — Emergency Department: Payer: PPO

## 2015-05-23 ENCOUNTER — Inpatient Hospital Stay: Payer: PPO

## 2015-05-23 ENCOUNTER — Inpatient Hospital Stay
Admission: EM | Admit: 2015-05-23 | Discharge: 2015-05-25 | DRG: 308 | Disposition: A | Discharge status: Home / Self Care | Payer: PPO | Attending: Specialist | Admitting: Specialist

## 2015-05-23 DIAGNOSIS — I255 Ischemic cardiomyopathy: Secondary | ICD-10-CM | POA: Diagnosis present

## 2015-05-23 DIAGNOSIS — I5022 Chronic systolic (congestive) heart failure: Secondary | ICD-10-CM | POA: Diagnosis present

## 2015-05-23 DIAGNOSIS — J441 Chronic obstructive pulmonary disease with (acute) exacerbation: Secondary | ICD-10-CM | POA: Diagnosis present

## 2015-05-23 DIAGNOSIS — I6529 Occlusion and stenosis of unspecified carotid artery: Secondary | ICD-10-CM | POA: Diagnosis present

## 2015-05-23 DIAGNOSIS — I350 Nonrheumatic aortic (valve) stenosis: Secondary | ICD-10-CM | POA: Diagnosis present

## 2015-05-23 DIAGNOSIS — I2510 Atherosclerotic heart disease of native coronary artery without angina pectoris: Secondary | ICD-10-CM | POA: Diagnosis present

## 2015-05-23 DIAGNOSIS — E782 Mixed hyperlipidemia: Secondary | ICD-10-CM | POA: Diagnosis present

## 2015-05-23 DIAGNOSIS — I480 Paroxysmal atrial fibrillation: Secondary | ICD-10-CM | POA: Diagnosis not present

## 2015-05-23 DIAGNOSIS — I248 Other forms of acute ischemic heart disease: Secondary | ICD-10-CM | POA: Diagnosis present

## 2015-05-23 DIAGNOSIS — I739 Peripheral vascular disease, unspecified: Secondary | ICD-10-CM | POA: Diagnosis present

## 2015-05-23 DIAGNOSIS — Z7982 Long term (current) use of aspirin: Secondary | ICD-10-CM

## 2015-05-23 DIAGNOSIS — I252 Old myocardial infarction: Secondary | ICD-10-CM

## 2015-05-23 DIAGNOSIS — Z87891 Personal history of nicotine dependence: Secondary | ICD-10-CM | POA: Diagnosis not present

## 2015-05-23 DIAGNOSIS — J189 Pneumonia, unspecified organism: Secondary | ICD-10-CM

## 2015-05-23 DIAGNOSIS — Z79891 Long term (current) use of opiate analgesic: Secondary | ICD-10-CM

## 2015-05-23 DIAGNOSIS — Z7951 Long term (current) use of inhaled steroids: Secondary | ICD-10-CM | POA: Diagnosis not present

## 2015-05-23 DIAGNOSIS — I714 Abdominal aortic aneurysm, without rupture: Secondary | ICD-10-CM | POA: Diagnosis present

## 2015-05-23 DIAGNOSIS — J9811 Atelectasis: Secondary | ICD-10-CM | POA: Diagnosis present

## 2015-05-23 DIAGNOSIS — Z7901 Long term (current) use of anticoagulants: Secondary | ICD-10-CM

## 2015-05-23 DIAGNOSIS — Z8619 Personal history of other infectious and parasitic diseases: Secondary | ICD-10-CM | POA: Diagnosis not present

## 2015-05-23 DIAGNOSIS — Z95828 Presence of other vascular implants and grafts: Secondary | ICD-10-CM

## 2015-05-23 DIAGNOSIS — K219 Gastro-esophageal reflux disease without esophagitis: Secondary | ICD-10-CM | POA: Diagnosis present

## 2015-05-23 DIAGNOSIS — Z888 Allergy status to other drugs, medicaments and biological substances status: Secondary | ICD-10-CM | POA: Diagnosis not present

## 2015-05-23 DIAGNOSIS — F419 Anxiety disorder, unspecified: Secondary | ICD-10-CM | POA: Diagnosis present

## 2015-05-23 DIAGNOSIS — J9621 Acute and chronic respiratory failure with hypoxia: Secondary | ICD-10-CM | POA: Diagnosis not present

## 2015-05-23 DIAGNOSIS — E785 Hyperlipidemia, unspecified: Secondary | ICD-10-CM | POA: Diagnosis present

## 2015-05-23 DIAGNOSIS — I482 Chronic atrial fibrillation: Secondary | ICD-10-CM | POA: Diagnosis present

## 2015-05-23 DIAGNOSIS — I4891 Unspecified atrial fibrillation: Secondary | ICD-10-CM | POA: Diagnosis present

## 2015-05-23 DIAGNOSIS — I214 Non-ST elevation (NSTEMI) myocardial infarction: Secondary | ICD-10-CM

## 2015-05-23 DIAGNOSIS — Z79899 Other long term (current) drug therapy: Secondary | ICD-10-CM

## 2015-05-23 DIAGNOSIS — I110 Hypertensive heart disease with heart failure: Secondary | ICD-10-CM | POA: Diagnosis present

## 2015-05-23 DIAGNOSIS — R0902 Hypoxemia: Secondary | ICD-10-CM

## 2015-05-23 DIAGNOSIS — J440 Chronic obstructive pulmonary disease with acute lower respiratory infection: Secondary | ICD-10-CM | POA: Diagnosis present

## 2015-05-23 HISTORY — DX: Unspecified atrial fibrillation: I48.91

## 2015-05-23 HISTORY — DX: Abdominal aortic aneurysm, without rupture: I71.4

## 2015-05-23 HISTORY — DX: Nonrheumatic aortic (valve) stenosis: I35.0

## 2015-05-23 HISTORY — DX: Occlusion and stenosis of unspecified carotid artery: I65.29

## 2015-05-23 HISTORY — DX: Essential (primary) hypertension: I10

## 2015-05-23 HISTORY — DX: Abdominal aortic aneurysm, without rupture, unspecified: I71.40

## 2015-05-23 LAB — BLOOD GAS, ARTERIAL
Acid-base deficit: 3.1 mmol/L — ABNORMAL HIGH (ref 0.0–2.0)
Allens test (pass/fail): POSITIVE — AB
BICARBONATE: 21.2 meq/L (ref 21.0–28.0)
FIO2: 0.4
O2 Saturation: 87.3 %
PCO2 ART: 35 mmHg (ref 32.0–48.0)
PH ART: 7.39 (ref 7.350–7.450)
Patient temperature: 37
pO2, Arterial: 54 mmHg — ABNORMAL LOW (ref 83.0–108.0)

## 2015-05-23 LAB — COMPREHENSIVE METABOLIC PANEL
ALBUMIN: 4.1 g/dL (ref 3.5–5.0)
ALT: 25 U/L (ref 17–63)
AST: 25 U/L (ref 15–41)
Alkaline Phosphatase: 47 U/L (ref 38–126)
Anion gap: 12 (ref 5–15)
BUN: 20 mg/dL (ref 6–20)
CHLORIDE: 105 mmol/L (ref 101–111)
CO2: 23 mmol/L (ref 22–32)
CREATININE: 0.89 mg/dL (ref 0.61–1.24)
Calcium: 9.1 mg/dL (ref 8.9–10.3)
GFR calc Af Amer: >60 mL/min (ref >60)
GLUCOSE: 161 mg/dL — AB (ref 65–99)
POTASSIUM: 4.3 mmol/L (ref 3.5–5.1)
SODIUM: 140 mmol/L (ref 135–145)
Total Bilirubin: 1.1 mg/dL (ref 0.3–1.2)
Total Protein: 7.3 g/dL (ref 6.5–8.1)

## 2015-05-23 LAB — CBC
HEMATOCRIT: 37.3 % — AB (ref 40.0–52.0)
Hemoglobin: 12.3 g/dL — ABNORMAL LOW (ref 13.0–18.0)
MCH: 31 pg (ref 26.0–34.0)
MCHC: 33.1 g/dL (ref 32.0–36.0)
MCV: 93.7 fL (ref 80.0–100.0)
PLATELETS: 199 10*3/uL (ref 150–440)
RBC: 3.98 MIL/uL — AB (ref 4.40–5.90)
RDW: 14.4 % (ref 11.5–14.5)
WBC: 18.3 10*3/uL — AB (ref 3.8–10.6)

## 2015-05-23 LAB — TROPONIN I
TROPONIN I: 0.1 ng/mL — AB (ref <0.031)
TROPONIN I: 0.17 ng/mL — AB (ref <0.031)
Troponin I: 0.18 ng/mL — ABNORMAL HIGH (ref <0.031)

## 2015-05-23 MED ORDER — LISINOPRIL 20 MG PO TABS
20.0000 mg | ORAL_TABLET | Freq: Every day | ORAL | Status: DC
  Administered 2015-05-23 – 2015-05-25 (×3): 20 mg via ORAL
  Filled 2015-05-23 (×4): qty 1

## 2015-05-23 MED ORDER — DOCUSATE SODIUM 100 MG PO CAPS
100.0000 mg | ORAL_CAPSULE | Freq: Two times a day (BID) | ORAL | Status: DC
  Administered 2015-05-23 – 2015-05-25 (×4): 100 mg via ORAL
  Filled 2015-05-23 (×6): qty 1

## 2015-05-23 MED ORDER — ASPIRIN 81 MG PO CHEW
324.0000 mg | CHEWABLE_TABLET | Freq: Once | ORAL | Status: AC
  Administered 2015-05-23: 324 mg via ORAL
  Filled 2015-05-23: qty 4

## 2015-05-23 MED ORDER — ALPRAZOLAM 0.25 MG PO TABS
0.2500 mg | ORAL_TABLET | Freq: Once | ORAL | Status: AC
Start: 2015-05-23 — End: 2015-05-23
  Administered 2015-05-23: 0.25 mg via ORAL
  Filled 2015-05-23: qty 1

## 2015-05-23 MED ORDER — SODIUM CHLORIDE 0.9 % IV BOLUS (SEPSIS)
500.0000 mL | Freq: Once | INTRAVENOUS | Status: AC
  Administered 2015-05-23: 500 mL via INTRAVENOUS

## 2015-05-23 MED ORDER — QUINAPRIL HCL 10 MG PO TABS
20.0000 mg | ORAL_TABLET | Freq: Every day | ORAL | Status: DC
  Filled 2015-05-23: qty 2

## 2015-05-23 MED ORDER — IPRATROPIUM-ALBUTEROL 0.5-2.5 (3) MG/3ML IN SOLN
3.0000 mL | RESPIRATORY_TRACT | Status: AC
  Administered 2015-05-23: 3 mL via RESPIRATORY_TRACT
  Filled 2015-05-23: qty 3

## 2015-05-23 MED ORDER — ONDANSETRON HCL 4 MG/2ML IJ SOLN: 4.0000 mg | Freq: Four times a day (QID) | INTRAMUSCULAR | Status: DC | PRN

## 2015-05-23 MED ORDER — ACETAMINOPHEN 650 MG RE SUPP: 650.0000 mg | Freq: Four times a day (QID) | RECTAL | Status: DC | PRN

## 2015-05-23 MED ORDER — DEXTROSE 5 % IV SOLN
500.0000 mg | INTRAVENOUS | Status: DC
  Administered 2015-05-23 – 2015-05-25 (×3): 500 mg via INTRAVENOUS
  Filled 2015-05-23 (×3): qty 500

## 2015-05-23 MED ORDER — METHYLPREDNISOLONE SODIUM SUCC 125 MG IJ SOLR
60.0000 mg | INTRAMUSCULAR | Status: AC
  Administered 2015-05-23: 60 mg via INTRAVENOUS
  Filled 2015-05-23: qty 2

## 2015-05-23 MED ORDER — DEXTROSE 5 % IV SOLN
1.0000 g | Freq: Every day | INTRAVENOUS | Status: DC
  Administered 2015-05-23 – 2015-05-25 (×3): 1 g via INTRAVENOUS
  Filled 2015-05-23 (×3): qty 10

## 2015-05-23 MED ORDER — IOHEXOL 350 MG/ML SOLN
75.0000 mL | Freq: Once | INTRAVENOUS | Status: AC | PRN
  Administered 2015-05-23: 75 mL via INTRAVENOUS

## 2015-05-23 MED ORDER — SIMVASTATIN 40 MG PO TABS
40.0000 mg | ORAL_TABLET | Freq: Every day | ORAL | Status: DC
  Administered 2015-05-23 – 2015-05-25 (×3): 40 mg via ORAL
  Filled 2015-05-23 (×4): qty 1

## 2015-05-23 MED ORDER — ASPIRIN EC 81 MG PO TBEC
81.0000 mg | DELAYED_RELEASE_TABLET | Freq: Every day | ORAL | Status: DC
  Administered 2015-05-23 – 2015-05-25 (×3): 81 mg via ORAL
  Filled 2015-05-23 (×3): qty 1

## 2015-05-23 MED ORDER — ONDANSETRON HCL 4 MG PO TABS: 4.0000 mg | ORAL_TABLET | Freq: Four times a day (QID) | ORAL | Status: DC | PRN

## 2015-05-23 MED ORDER — LEVOFLOXACIN IN D5W 750 MG/150ML IV SOLN
750.0000 mg | Freq: Once | INTRAVENOUS | Status: AC
  Administered 2015-05-23: 750 mg via INTRAVENOUS
  Filled 2015-05-23: qty 150

## 2015-05-23 MED ORDER — FUROSEMIDE 40 MG PO TABS
40.0000 mg | ORAL_TABLET | Freq: Every day | ORAL | Status: DC
  Administered 2015-05-24 – 2015-05-25 (×2): 40 mg via ORAL
  Filled 2015-05-23 (×2): qty 1

## 2015-05-23 MED ORDER — FLUTICASONE PROPIONATE 50 MCG/ACT NA SUSP
1.0000 | NASAL | Status: DC | PRN
  Filled 2015-05-23: qty 16

## 2015-05-23 MED ORDER — ACETAMINOPHEN 325 MG PO TABS: 650.0000 mg | ORAL_TABLET | Freq: Four times a day (QID) | ORAL | Status: DC | PRN

## 2015-05-23 MED ORDER — FUROSEMIDE 10 MG/ML IJ SOLN
40.0000 mg | Freq: Once | INTRAMUSCULAR | Status: AC
  Administered 2015-05-23: 40 mg via INTRAVENOUS

## 2015-05-23 MED ORDER — METOPROLOL SUCCINATE ER 25 MG PO TB24
25.0000 mg | ORAL_TABLET | Freq: Every day | ORAL | Status: DC
  Administered 2015-05-23 – 2015-05-25 (×3): 25 mg via ORAL
  Filled 2015-05-23 (×3): qty 1

## 2015-05-23 MED ORDER — MONTELUKAST SODIUM 10 MG PO TABS
10.0000 mg | ORAL_TABLET | Freq: Every day | ORAL | Status: DC
  Administered 2015-05-24 – 2015-05-25 (×2): 10 mg via ORAL
  Filled 2015-05-23 (×4): qty 1

## 2015-05-23 MED ORDER — AMLODIPINE BESYLATE 5 MG PO TABS
5.0000 mg | ORAL_TABLET | Freq: Every day | ORAL | Status: DC
  Administered 2015-05-23 – 2015-05-25 (×3): 5 mg via ORAL
  Filled 2015-05-23 (×3): qty 1

## 2015-05-23 MED ORDER — VERAPAMIL HCL ER 180 MG PO TBCR
180.0000 mg | EXTENDED_RELEASE_TABLET | Freq: Every day | ORAL | Status: DC
  Administered 2015-05-23 – 2015-05-24 (×2): 180 mg via ORAL
  Filled 2015-05-23 (×3): qty 1

## 2015-05-23 MED ORDER — ENOXAPARIN SODIUM 40 MG/0.4ML ~~LOC~~ SOLN
40.0000 mg | SUBCUTANEOUS | Status: DC
  Administered 2015-05-23 – 2015-05-25 (×3): 40 mg via SUBCUTANEOUS
  Filled 2015-05-23 (×4): qty 0.4

## 2015-05-23 MED ORDER — MOMETASONE FURO-FORMOTEROL FUM 100-5 MCG/ACT IN AERO
2.0000 | INHALATION_SPRAY | Freq: Two times a day (BID) | RESPIRATORY_TRACT | Status: DC
  Administered 2015-05-23 – 2015-05-25 (×4): 2 via RESPIRATORY_TRACT
  Filled 2015-05-23: qty 8.8

## 2015-05-23 MED ORDER — HYDROCODONE-ACETAMINOPHEN 5-325 MG PO TABS: 1.0000 | ORAL_TABLET | Freq: Four times a day (QID) | ORAL | Status: DC | PRN

## 2015-05-23 MED ORDER — IPRATROPIUM-ALBUTEROL 0.5-2.5 (3) MG/3ML IN SOLN
3.0000 mL | Freq: Four times a day (QID) | RESPIRATORY_TRACT | Status: DC | PRN
  Administered 2015-05-23: 3 mL via RESPIRATORY_TRACT
  Filled 2015-05-23: qty 3

## 2015-05-23 MED ORDER — FUROSEMIDE 10 MG/ML IJ SOLN
INTRAMUSCULAR | Status: AC
  Filled 2015-05-23: qty 4

## 2015-05-23 MED ORDER — DILTIAZEM HCL 25 MG/5ML IV SOLN
20.0000 mg | Freq: Once | INTRAVENOUS | Status: AC
  Administered 2015-05-23: 20 mg via INTRAVENOUS
  Filled 2015-05-23: qty 5

## 2015-05-23 MED ORDER — SODIUM CHLORIDE 0.9% FLUSH
3.0000 mL | Freq: Two times a day (BID) | INTRAVENOUS | Status: DC
  Administered 2015-05-23 – 2015-05-25 (×5): 3 mL via INTRAVENOUS

## 2015-05-23 NOTE — Progress Notes (Signed)
Patient arrived to floor from ED. On 2 LNC tele box on and verified with Tobi Bastos, Skin verified with Luther Parody RN. No signs of distress noted at this time. Pt oriented to the floor and room, resting quietly with bed in the lowest position with call light in reach and bed alarm on will continue to monitor.

## 2015-05-23 NOTE — Progress Notes (Signed)
Patient had episode of respiratory distress while on the floor and complaints of being smothered. Hypoxic sats 89% on 5 L nasal cannula. Stat ABG ordered showing normal pH and PCO2. But noted hypoxic. --Nasal cannula started and also CT angios ordered to rule out any PE. -One dose of IV Lasix given and also started on oral Lasix daily. -If there is no PE and CT just shows pneumonia, then we'll change over to broad-spectrum antibiotics.

## 2015-05-23 NOTE — ED Provider Notes (Signed)
[facility] Emergency Department Provider Note  Time seen: 10:36 AM  I have reviewed the triage vital signs and the nursing notes.   HISTORY  Chief Complaint Shortness of Breath    HPI Calvin Carpenter is a 82 y.o. male with a past medical history of hypertension, hyperlipidemia, previous MI, AAA, atrial fibrillation, presents the emergency department with shortness of breath and chest pressure. According to the patient since this morning he has been experiencing shortness of breath and feeling chest pressure along with palpitations. He has not taken any of his morning medications. Describes a chest pressure as very mild, but moderate shortness of breath worse with any type of exertion. Denies leg pain or swelling. Denies nausea or diaphoresis.     Past Medical History  Diagnosis Date  . Myocardial infarct (HCC) 04-23-13  . History of chicken pox   . History of measles   . History of mumps   . CHF (congestive heart failure) (HCC)   . C. difficile colitis 12/02/2014    following prolonged antibiotic treatment for post op pneumonia     Patient Active Problem List   Diagnosis Date Noted  . Hyperglycemia 01/30/2015  . Aortic valve disorder 12/02/2014  . Carotid arterial disease (HCC) 12/02/2014  . Insomnia 12/02/2014  . Peripheral vascular disease (HCC) 12/02/2014  . Shoulder strain 12/02/2014  . COPD (chronic obstructive pulmonary disease) (HCC) 12/02/2014  . Chronic diastolic heart failure (HCC) 10/24/2014  . Cardiomyopathy, ischemic 08/28/2014  . A-fib (HCC) 08/28/2014  . Myocardial ischemia 08/28/2014  . Anemia 05/16/2013  . Rotator cuff syndrome 10/28/2009  . Arteriosclerosis of coronary artery 10/27/2005  . Hemorrhoids without complication 03/21/2005  . Allergic rhinitis 10/29/2001  . AAA (abdominal aortic aneurysm) (HCC) 08/16/1996  . GERD (gastroesophageal reflux disease) 05/23/1994  . HLD (hyperlipidemia) 05/23/1994  . Essential (primary)  hypertension 05/23/1994  . Former smoker 05/23/1994    Past Surgical History  Procedure Laterality Date  . Groin surgery  2011  . Carotid endarterectomy Right 2007    Sankar  . Vascular surgery Bilateral     Aortic aneurysm stent, right femoral-left femoral bypass  . Hernia repair Left 10-29-12 and 04-23-13    inguinal  . Femoral bypass  09/15/2010    Fem-Fem Bypass; Dr. Earnestine Leys, [facility]. Bilateral with femoral endartectomies. Excised graft June 2012 dur to infection  . Abdominal aortic aneurysm repair  2009  . Cataract extraction      Right eye 2001; Left eye 2000  . Prostate surgery  1993  . Kidney stone extraction  1980  . Cardiac catheterization  10/27/2005    Severe 2 vessel CAD with occluded prox RCA with collaterals. Lx occlusion. 95% OMI, 30% Mid LAD. Mild aortic stenosis  . Doppler echocardiography  09/08/2011    Mild global LV dysfunction EF=45%, MILD lvh, mild MI, TI. Severe AS    Current Outpatient Rx  Name  Route  Sig  Dispense  Refill  . albuterol (VENTOLIN HFA) 108 (90 BASE) MCG/ACT inhaler   Inhalation   Inhale 2 puffs into the lungs every 6 (six) hours.         Marland Kitchen amLODipine (NORVASC) 5 MG tablet   Oral   Take 1 tablet (5 mg total) by mouth daily.   90 tablet   4   . aspirin 81 MG tablet   Oral   Take 81 mg by mouth daily.         . Diclofenac Sodium 2 % SOLN  Up to 40 drops to affected area of knee four times a day as needed.   1 Bottle   0   . Fluocinonide 0.1 % CREA      Apply daily as needed   30 g   1   . fluticasone (FLONASE) 50 MCG/ACT nasal spray   Each Nare   Place 1 spray into both nostrils as needed for allergies or rhinitis.         . furosemide (LASIX) 20 MG tablet      TAKE ONE (1) TABLET EACH DAY   90 tablet   3   . HYDROcodone-acetaminophen (NORCO/VICODIN) 5-325 MG tablet   Oral   Take 1 tablet by mouth every 6 (six) hours as needed for moderate pain.   30 tablet   0   . ipratropium-albuterol (DUONEB) 0.5-2.5 (3)  MG/3ML SOLN   Nebulization   Take 3 mLs by nebulization every 6 (six) hours as needed.         . metoprolol succinate (TOPROL-XL) 25 MG 24 hr tablet   Oral   Take 1 tablet (25 mg total) by mouth daily.   90 tablet   4   . montelukast (SINGULAIR) 10 MG tablet   Oral   Take 1 tablet (10 mg total) by mouth as needed.   90 tablet   1   . quinapril (ACCUPRIL) 20 MG tablet   Oral   Take 1 tablet (20 mg total) by mouth daily.   90 tablet   4   . simvastatin (ZOCOR) 40 MG tablet   Oral   Take 1 tablet (40 mg total) by mouth daily.   90 tablet   4   . verapamil (VERELAN PM) 180 MG 24 hr capsule   Oral   Take 1 capsule (180 mg total) by mouth at bedtime.   1 capsule   1     Allergies Benazepril hcl; Mevacor ; and Fluvastatin sodium  Family History  Problem Relation Age of Onset  . Diabetes Brother   . Diabetes Mother   . Ovarian cancer Mother   . Lung cancer Father     Social History Social History  Substance Use Topics  . Smoking status: Former Smoker -- 1.00 packs/day for 65 years    Types: Cigarettes    Quit date: 04/26/2011  . Smokeless tobacco: Never Used  . Alcohol Use: No    Review of Systems Constitutional: Negative for fever. Cardiovascular: Mild chest pressure. Positive for palpitations. Respiratory: Moderate shortness of breath Gastrointestinal: Negative for abdominal pain. Negative for nausea or vomiting. Musculoskeletal: Negative for back pain. Neurological: Negative for headache 10-point ROS otherwise negative.  ____________________________________________   PHYSICAL EXAM:  VITAL SIGNS: ED Triage Vitals  Enc Vitals Group     BP 05/23/15 1013 91/63 mmHg     Pulse Rate 05/23/15 1013 81     Resp 05/23/15 1013 18     Temp 05/23/15 1013 98.9 F (37.2 C)     Temp Source 05/23/15 1013 Oral     SpO2 05/23/15 1013 90 %     Weight 05/23/15 1013 159 lb (72.122 kg)     Height 05/23/15 1013  (1.702 m)     Head Cir --      Peak Flow --       Pain Score 05/23/15 1014 1     Pain Loc --      Pain Edu? --      Excl. in GC? --  Constitutional: Alert and oriented. Well appearing and in no distress. Eyes: Normal exam ENT   Head: Normocephalic and atraumatic   Mouth/Throat: Mucous membranes are moist. Cardiovascular: Irregular rhythm, rate around 130 bpm. Respiratory: Normal respiratory effort without tachypnea nor retractions. Breath sounds are clear  Gastrointestinal: Soft and nontender. No distention. Musculoskeletal: Nontender with normal range of motion in all extremities. No lower extremity tenderness or edema. Neurologic:  Normal speech and language. No gross focal neurologic deficits  Skin:  Skin is warm, dry and intact.  Psychiatric: Mood and affect are normal. Speech and behavior are normal.  ____________________________________________    EKG  EKG reviewed and interpreted by myself shows atrial fibrillation 127 bpm, narrow QRS, normal axis, patient has inferolateral ST depressions. Compared to his prior EKG 04/16/15 it is largely unchanged. No ST elevations noted.  ____________________________________________    RADIOLOGY  X-ray consistent with bibasal infiltrates.  ____________________________________________    INITIAL IMPRESSION / ASSESSMENT AND PLAN / ED COURSE  Pertinent labs & imaging results that were available during my care of the patient were reviewed by me and considered in my medical decision making (see chart for details).  Patient presents the emergency department with shortness of breath and palpitations. Patient's exam is consistent with atrial fibrillation with rapid ventricular response. EKG is most consistent with ischemia changes likely due to demand ischemia. Patient does have mild chest pressure. We will check labs, chest x-ray, does IV diltiazem, IV fluids, and closely monitor in the emergency department. Patient has not taken his morning medications, we will dose 324 mg of  aspirin while awaiting lab results. Patient was recently admitted to the hospital at the end of December for pneumonia and and and STEMI which was thought to be the result of the pneumonia. States since going home he has not had any cough or fever, states minimal shortness of breath until today. Patient was seen by cardiology during his stay however he did not have a cardiac procedure performed. Patient has a history of chronic atrial fibrillation for which he takes metoprolol, he does not take anticoagulation due to his AAA and bleeding risk.  Patient's x-rays consistent with bibasal infiltrates. Troponin is elevated 0.10. Given his atrial fibrillation with rapid ventricular responses could be due demand ischemia, however during the patient's last admission his initial troponins were largely normal and then elevated significantly consistent with NSTEMI.  Patient's heart rate is currently 95 bpm, continues to be nature fibrillation but rate controlled.   CRITICAL CARE Performed by: Minna Antis   Total critical care time: 30 minutes  Critical care time was exclusive of separately billable procedures and treating other patients.  Critical care was necessary to treat or prevent imminent or life-threatening deterioration.  Critical care was time spent personally by me on the following activities: development of treatment plan with patient and/or surrogate as well as nursing, discussions with consultants, evaluation of patient's response to treatment, examination of patient, obtaining history from patient or surrogate, ordering and performing treatments and interventions, ordering and review of laboratory studies, ordering and review of radiographic studies, pulse oximetry and re-evaluation of patient's condition.    ____________________________________________   FINAL CLINICAL IMPRESSION(S) / ED DIAGNOSES  NSTEMI Pneumonia Age of fibrillation with rapid ventricular response  Minna Antis, MD 05/23/15 1136

## 2015-05-23 NOTE — ED Notes (Signed)
Report called to Ashley, RN.

## 2015-05-23 NOTE — Progress Notes (Signed)
°   05/23/15 1900  Clinical Encounter Type  Visited With Patient and family together  Visit Type Initial  Referral From Nurse  Consult/Referral To Chaplain  Spiritual Encounters  Spiritual Needs Other (Comment)  Patient requested AD.  AD documents given/education. Chaplain [employer] Ext [phone]

## 2015-05-23 NOTE — ED Notes (Signed)
Reports sob since last pm, states dx with pneumonia in December.  No distress at this time.

## 2015-05-23 NOTE — Consult Note (Signed)
ANTIBIOTIC CONSULT NOTE - INITIAL  Pharmacy Consult for ceftriaxone Indication: pneumonia  Allergies  Allergen Reactions  . Benazepril Hcl Rash  . Fluvastatin Sodium Rash  . Mevacor [Lovastatin] Rash    Patient Measurements: Height:  (170.2 cm) Weight: 159 lb (72.122 kg) IBW/kg (Calculated) : 66.1 Adjusted Body Weight:   Vital Signs: Temp: 98.9 F (37.2 C) (01/28 1013) Temp Source: Oral (01/28 1013) BP: 99/57 mmHg (01/28 1353) Pulse Rate: 110 (01/28 1353) Intake/Output from previous day:   Intake/Output from this shift:    Labs:  Recent Labs  05/23/15 1036  WBC 18.3*  HGB 12.3*  PLT 199  CREATININE 0.89   Estimated Creatinine Clearance: 59.8 mL/min (by C-G formula based on Cr of 0.89). No results for input(s): VANCOTROUGH, VANCOPEAK, VANCORANDOM, GENTTROUGH, GENTPEAK, GENTRANDOM, TOBRATROUGH, TOBRAPEAK, TOBRARND, AMIKACINPEAK, AMIKACINTROU, AMIKACIN in the last 72 hours.   Microbiology: No results found for this or any previous visit (from the past 720 hour(s)).  Medical History: Past Medical History  Diagnosis Date  . Myocardial infarct [facility]) 04-23-13    CAD on cardiac cath, no PCI or CABG  . History of chicken pox   . History of measles   . History of mumps   . CHF (congestive heart failure) (HCC)     systolic dysfunction, EF 40-45%  . C. difficile colitis 12/02/2014    following prolonged antibiotic treatment for post op pneumonia   . AAA (abdominal aortic aneurysm) (HCC)   . Carotid stenosis   . HTN (hypertension)   . Hyperlipidemia   . Severe aortic stenosis   . COPD (chronic obstructive pulmonary disease) (HCC)   . Atrial fibrillation (HCC)     Medications:  Scheduled:  . amLODipine  5 mg Oral Daily  . aspirin EC  81 mg Oral Daily  . docusate sodium  100 mg Oral BID  . enoxaparin (LOVENOX) injection  40 mg Subcutaneous Q24H  . metoprolol succinate  25 mg Oral Daily  . mometasone-formoterol  2 puff Inhalation BID  . montelukast  10 mg  Oral Daily  . quinapril  20 mg Oral Daily  . simvastatin  40 mg Oral Daily  . sodium chloride flush  3 mL Intravenous Q12H  . verapamil  180 mg Oral QHS   Assessment: Pt is a 82 year old male presenting with CAP. Pharmacy consulted to dose ceftriaxone. Pt received one dose of levofloxacin in the ER. Hosptialist now switching to ceftriaxone and azithromycin  Goal of Therapy:  resolution of infection  Plan:  Follow up culture results ceftriaxone 1g q 24 hours. pharmacy to continue to [facility] you for the consult  Olene Floss 05/23/2015,2:09 PM

## 2015-05-23 NOTE — Progress Notes (Addendum)
Patient complains of SOB Spo2 89% on 5 LNC. Respiratory notified for a breathing treatment. Dr. Nemiah Commander notified orders received for blood gas, furosemide. Patient still not recovering post breathing treatment placed on 6 LNC and spo2 89%. Dr. Nemiah Commander notified and orders received for high flow nasal canula and CT angio. Appears to be anxious orders received for xanax. Will continue to monitor.

## 2015-05-23 NOTE — ED Notes (Signed)
Troponin 0.10. MD notified.

## 2015-05-23 NOTE — H&P (Signed)
[facility] - [place] at [facility]   PATIENT NAME: Calvin Carpenter    MR#:  [id number]  DATE OF BIRTH:  [birth year]  DATE OF ADMISSION:  05/23/2015  PRIMARY CARE PHYSICIAN: Mila Merry, MD   REQUESTING/REFERRING PHYSICIAN: Dr. Minna Antis  CHIEF COMPLAINT:   Chief Complaint  Patient presents with  . Shortness of Breath    HISTORY OF PRESENT ILLNESS:  Calvin Carpenter  is a 82 y.o. male with a known history of hypertension, hyperlipidemia, paroxysmal atrial fibrillation, moderate to severe aortic stenosis, coronary artery disease with ischemic cardiomyopathy and systolic congestive heart failure, COPD not on any home oxygen presents to the hospital secondary to shortness of breath and chest tightness that started since yesterday. Patient states his symptoms started yesterday afternoon when he started to feel weak and had a couple of coughing episodes. Denies any productive cough. No fevers or chills, no nausea or vomiting. Started to have difficulty breathing last night and used his albuterol inhaler a few times with no relief. He is also developing some chest pressure so presented to the emergency room. Felt that his heartbeat worse faster, denies any palpitations though. He is noted to be in atrial fibrillation with rapid ventricular response. Chest x-ray shows bibasilar infiltrates. First troponin is elevated. Patient was admitted last month, December 2016 for similar complaints and was treated with Levaquin for pneumonia at the time.  PAST MEDICAL HISTORY:   Past Medical History  Diagnosis Date  . Myocardial infarct [facility]) 04-23-13    CAD on cardiac cath, no PCI or CABG  . History of chicken pox   . History of measles   . History of mumps   . CHF (congestive heart failure) (HCC)     systolic dysfunction, EF 40-45%  . C. difficile colitis 12/02/2014    following prolonged antibiotic treatment for post op pneumonia   . AAA (abdominal aortic aneurysm)  (HCC)   . Carotid stenosis   . HTN (hypertension)   . Hyperlipidemia   . Severe aortic stenosis   . COPD (chronic obstructive pulmonary disease) (HCC)   . Atrial fibrillation (HCC)     PAST SURGICAL HISTORY:   Past Surgical History  Procedure Laterality Date  . Groin surgery  2011  . Carotid endarterectomy Right 2007    Sankar  . Vascular surgery Bilateral     Aortic aneurysm stent, right femoral-left femoral bypass  . Hernia repair Left 10-29-12 and 04-23-13    inguinal  . Femoral bypass  09/15/2010    Fem-Fem Bypass; Dr. Earnestine Leys, [facility]. Bilateral with femoral endartectomies. Excised graft June 2012 dur to infection  . Abdominal aortic aneurysm repair  2009  . Cataract extraction      Right eye 2001; Left eye 2000  . Prostate surgery  1993  . Kidney stone extraction  1980  . Cardiac catheterization  10/27/2005    Severe 2 vessel CAD with occluded prox RCA with collaterals. Lx occlusion. 95% OMI, 30% Mid LAD. Mild aortic stenosis  . Doppler echocardiography  09/08/2011    Mild global LV dysfunction EF=45%, MILD lvh, mild MI, TI. Severe AS    SOCIAL HISTORY:   Social History  Substance Use Topics  . Smoking status: Former Smoker -- 1.00 packs/day for 65 years    Types: Cigarettes    Quit date: 04/26/2011  . Smokeless tobacco: Never Used     Comment: Quit 2 years ago  . Alcohol Use: No    FAMILY HISTORY:   Family  History  Problem Relation Age of Onset  . Diabetes Brother   . Diabetes Mother   . Ovarian cancer Mother   . Lung cancer Father     DRUG ALLERGIES:   Allergies  Allergen Reactions  . Benazepril Hcl Rash  . Fluvastatin Sodium Rash  . Mevacor [Lovastatin] Rash    REVIEW OF SYSTEMS:   Review of Systems  Constitutional: Positive for malaise/fatigue. Negative for fever, chills and weight loss.  HENT: Negative for ear discharge, ear pain, hearing loss and nosebleeds.   Eyes: Negative for blurred vision, double vision and photophobia.  Respiratory:  Positive for cough, shortness of breath and wheezing. Negative for hemoptysis.   Cardiovascular: Positive for chest pain. Negative for palpitations, orthopnea and leg swelling.  Gastrointestinal: Negative for heartburn, nausea, vomiting, abdominal pain, diarrhea, constipation and melena.  Genitourinary: Negative for dysuria, urgency, frequency and hematuria.  Musculoskeletal: Negative for myalgias, back pain and neck pain.  Skin: Negative for rash.  Neurological: Negative for dizziness, tremors, sensory change, speech change, focal weakness and headaches.  Endo/Heme/Allergies: Does not bruise/bleed easily.  Psychiatric/Behavioral: Negative for depression.    MEDICATIONS AT HOME:   Prior to Admission medications   Medication Sig Start Date End Date Taking? Authorizing Provider  albuterol (VENTOLIN HFA) 108 (90 BASE) MCG/ACT inhaler Inhale 2 puffs into the lungs every 6 (six) hours. 09/04/14   Historical Provider, MD  amLODipine (NORVASC) 5 MG tablet Take 1 tablet (5 mg total) by mouth daily. 05/05/15   Malva Limes, MD  aspirin 81 MG tablet Take 81 mg by mouth daily.    Historical Provider, MD  Fluocinonide 0.1 % CREA Apply daily as needed 12/02/14   Malva Limes, MD  fluticasone [facility]) 50 MCG/ACT nasal spray Place 1 spray into both nostrils as needed for allergies or rhinitis.    Historical Provider, MD  furosemide (LASIX) 20 MG tablet TAKE ONE (1) TABLET EACH DAY 03/24/15   Malva Limes, MD  HYDROcodone-acetaminophen (NORCO/VICODIN) 5-325 MG tablet Take 1 tablet by mouth every 6 (six) hours as needed for moderate pain. 04/10/15   Malva Limes, MD  ipratropium-albuterol (DUONEB) 0.5-2.5 (3) MG/3ML SOLN Take 3 mLs by nebulization every 6 (six) hours as needed.    Historical Provider, MD  metoprolol succinate (TOPROL-XL) 25 MG 24 hr tablet Take 1 tablet (25 mg total) by mouth daily. 05/05/15   Malva Limes, MD  montelukast (SINGULAIR) 10 MG tablet Take 1 tablet (10 mg total) by  mouth as needed. 12/02/14   Malva Limes, MD  quinapril (ACCUPRIL) 20 MG tablet Take 1 tablet (20 mg total) by mouth daily. 05/05/15   Malva Limes, MD  simvastatin (ZOCOR) 40 MG tablet Take 1 tablet (40 mg total) by mouth daily. 05/05/15   Malva Limes, MD  verapamil (VERELAN PM) 180 MG 24 hr capsule Take 1 capsule (180 mg total) by mouth at bedtime. 05/18/15   Malva Limes, MD      VITAL SIGNS:  Blood pressure 115/59, pulse 92, temperature 98.9 F (37.2 C), temperature source Oral, resp. rate 15, height 5\' 7"  (1.702 m), weight 72.122 kg (159 lb), SpO2 94 %.  PHYSICAL EXAMINATION:   Physical Exam  GENERAL:  82 y.o.-year-old elderly patient lying in the bed with no acute distress.  EYES: Pupils equal, round, reactive to light and accommodation. No scleral icterus. Extraocular muscles intact.  HEENT: Head atraumatic, normocephalic. Oropharynx and nasopharynx clear.  NECK:  Supple, no jugular venous distention.  No thyroid enlargement, no tenderness. Carotid bruit heard on left side. LUNGS: Normal breath sounds bilaterally, no wheezing, rales,rhonchi or crepitation. No use of accessory muscles of respiration.  CARDIOVASCULAR: S1, S2 heard but irregular, soft systolic murmur in aortic area, No rubs, or gallops.  ABDOMEN: Soft, nontender, nondistended. Bowel sounds present. No organomegaly or mass.  EXTREMITIES: No pedal edema, cyanosis, or clubbing.  NEUROLOGIC: Cranial nerves II through XII are intact. Muscle strength 5/5 in all extremities. Sensation intact. Gait not checked.  PSYCHIATRIC: The patient is alert and oriented x 3.  SKIN: No obvious rash, lesion, or ulcer.   LABORATORY PANEL:   CBC  Recent Labs Lab 05/23/15 1036  WBC 18.3*  HGB 12.3*  HCT 37.3*  PLT 199   ------------------------------------------------------------------------------------------------------------------  Chemistries   Recent Labs Lab 05/23/15 1036  NA 140  K 4.3  CL 105  CO2 23   GLUCOSE 161*  BUN 20  CREATININE 0.89  CALCIUM 9.1  AST 25  ALT 25  ALKPHOS 47  BILITOT 1.1   ------------------------------------------------------------------------------------------------------------------  Cardiac Enzymes  Recent Labs Lab 05/23/15 1036  TROPONINI 0.10*   ------------------------------------------------------------------------------------------------------------------  RADIOLOGY:  Dg Chest Portable 1 View  05/23/2015  CLINICAL DATA:  Shortness of Breath EXAM: PORTABLE CHEST 1 VIEW COMPARISON:  04/22/2015 FINDINGS: Cardiac shadow is at the upper limits of normal in size. Aortic calcifications are noted. Bilateral basilar infiltrates are noted. No sizable effusion is seen. No bony abnormality is noted. IMPRESSION: Bibasilar infiltrates. Electronically Signed   By: Alcide Clever M.D.   On: 05/23/2015 10:59    EKG:   Orders placed or performed during the hospital encounter of 05/23/15  . ED EKG  . ED EKG    IMPRESSION AND PLAN:   Calvin Carpenter  is a 82 y.o. male with a known history of hypertension, hyperlipidemia, paroxysmal atrial fibrillation, moderate to severe aortic stenosis, coronary artery disease with ischemic cardiomyopathy and systolic congestive heart failure, COPD not on any home oxygen presents to the hospital secondary to shortness of breath and chest tightness that started since yesterday.  1. Community-acquired pneumonia-bibasilar infiltrates on chest x-ray. -Similar infiltrates noted in December and also April last year, slightly worse now. Not sure if patient has some scar tissue there. If recurrent admissions for same thing, might benefit from having a CT of the chest. -Blood cultures have been drawn. Started on Rocephin and azithromycin at this time.  2. COPD-has scattered wheezing on exam. Only on albuterol inhaler at home. -Start maintenance inhaler-Dulera in the hospital, given nebs when necessary. -On 2 L oxygen now, can be weaned  off. -Hold off on starting systemic steroids at this time.  3. Elevated troponin-likely demand ischemia. Also patient has severe aortic stenosis. -Monitor on telemetry. Hold off systemic anticoagulation. -Recycle troponins. Monitor on telemetry. Denies any chest pain at this time. -Patient does have ST depressions in the lateral leads-compare with old EKG  4. Atrial fibrillation with rapid ventricular response-has paroxysmal A. fib, now in A. fib with rapid ventricular response. -Rate is reasonably controlled at this time. So continue his oral verapamil and Toprol. -Cardiology consulted. Not an anticoagulation candidate due to history of AAA and bleeding risk -Monitor on telemetry floor  5. Chronic systolic CHF-well compensated at this time. No fluid overload noted. Hold Lasix for today. -Echocardiogram from October 2016 with ejection fraction of 40-45%. -Also has moderate to severe aortic stenosis and is being followed by a cardiologist  6. Hypertension-continue home medications-on verapamil, Norvasc, Toprol and quinapril  7. Hyperlipidemia-continue home dose of statin  8. DVT prophylaxis-on subcutaneous Lovenox   All the records are reviewed and case discussed with ED provider. Management plans discussed with the patient, family and they are in agreement.  CODE STATUS: Full Code  TOTAL TIME TAKING CARE OF THIS PATIENT: 50  minutes.    Channel Papandrea M.D on 05/23/2015 at 12:12 PM  Between 7am to 6pm - Pager - [phone]  After 6pm go to [email or web address] - password [email or web address]  [place] [facility]  Office  [phone]  CC: Primary care physician; Mila Merry, MD

## 2015-05-23 NOTE — ED Notes (Signed)
Charge RN notified, will give patient duoneb treatment and solumedrol as ordered, then have pt transferred to the floor.

## 2015-05-24 LAB — TROPONIN I: Troponin I: 0.25 ng/mL — ABNORMAL HIGH (ref <0.031)

## 2015-05-24 MED ORDER — ZOLPIDEM TARTRATE 5 MG PO TABS: 5.0000 mg | ORAL_TABLET | Freq: Every evening | ORAL | Status: DC | PRN

## 2015-05-24 MED ORDER — ALPRAZOLAM 0.25 MG PO TABS
0.2500 mg | ORAL_TABLET | Freq: Once | ORAL | Status: AC
  Administered 2015-05-24: 0.25 mg via ORAL
  Filled 2015-05-24: qty 1

## 2015-05-24 MED ORDER — METHYLPREDNISOLONE SODIUM SUCC 40 MG IJ SOLR
40.0000 mg | Freq: Two times a day (BID) | INTRAMUSCULAR | Status: DC
  Administered 2015-05-24 – 2015-05-25 (×3): 40 mg via INTRAVENOUS
  Filled 2015-05-24 (×3): qty 1

## 2015-05-24 NOTE — Progress Notes (Signed)
[facility] - [place] at [facility]   PATIENT NAME: Calvin Carpenter    MR#:  [id number]  DATE OF BIRTH:  October 25, 1930  SUBJECTIVE:   Patient here due to shortness of breath and noted to be in acute respiratory failure with hypoxia. Daughter at bedside and and clinically pt. Feels much better.    REVIEW OF SYSTEMS:    Review of Systems  Constitutional: Negative for fever and chills.  HENT: Negative for congestion and tinnitus.   Eyes: Negative for blurred vision and double vision.  Respiratory: Positive for shortness of breath. Negative for cough and wheezing.   Cardiovascular: Negative for chest pain, orthopnea and PND.  Gastrointestinal: Negative for nausea, vomiting, abdominal pain and diarrhea.  Genitourinary: Negative for dysuria and hematuria.  Neurological: Positive for weakness (eneralized). Negative for dizziness, sensory change and focal weakness.  All other systems reviewed and are negative.   Nutrition: Heart healthy Tolerating Diet: Yes Tolerating PT: Await Eval.   DRUG ALLERGIES:   Allergies  Allergen Reactions  . Benazepril Hcl Rash  . Fluvastatin Sodium Rash  . Mevacor [Lovastatin] Rash    VITALS:  Blood pressure 115/54, pulse 48, temperature 97.9 F (36.6 C), temperature source Oral, resp. rate 20, height  (1.702 m), weight 72.122 kg (159 lb), SpO2 93 %.  PHYSICAL EXAMINATION:   Physical Exam  GENERAL:  82 y.o.-year-old patient lying in the bed with no acute distress.  EYES: Pupils equal, round, reactive to light and accommodation. No scleral icterus. Extraocular muscles intact.  HEENT: Head atraumatic, normocephalic. Oropharynx and nasopharynx clear.  NECK:  Supple, no jugular venous distention. No thyroid enlargement, no tenderness.  LUNGS: Good air entry bilaterally, bibasilar rales. No rhonchi, wheezing. No use of accessory muscles of respiration.  CARDIOVASCULAR: S1, S2 normal. II/VI SEM at LSB, rubs, or gallops.   ABDOMEN: Soft, nontender, nondistended. Bowel sounds present. No organomegaly or mass.  EXTREMITIES: No cyanosis, clubbing or edema b/l.    NEUROLOGIC: Cranial nerves II through XII are intact. No focal Motor or sensory deficits b/l.   PSYCHIATRIC: The patient is alert and oriented x 3. Good affect.  SKIN: No obvious rash, lesion, or ulcer.    LABORATORY PANEL:   CBC  Recent Labs Lab 05/23/15 1036  WBC 18.3*  HGB 12.3*  HCT 37.3*  PLT 199   ------------------------------------------------------------------------------------------------------------------  Chemistries   Recent Labs Lab 05/23/15 1036  NA 140  K 4.3  CL 105  CO2 23  GLUCOSE 161*  BUN 20  CREATININE 0.89  CALCIUM 9.1  AST 25  ALT 25  ALKPHOS 47  BILITOT 1.1   ------------------------------------------------------------------------------------------------------------------  Cardiac Enzymes  Recent Labs Lab 05/24/15 0240  TROPONINI 0.25*   ------------------------------------------------------------------------------------------------------------------  RADIOLOGY:  Ct Angio Chest Pe W/cm &/or Wo Cm  05/23/2015  CLINICAL DATA:  Difficulty breathing. EXAM: CT ANGIOGRAPHY CHEST WITH CONTRAST TECHNIQUE: Multidetector CT imaging of the chest was performed using the standard protocol during bolus administration of intravenous contrast. Multiplanar CT image reconstructions and MIPs were obtained to evaluate the vascular anatomy. CONTRAST:  75mL OMNIPAQUE IOHEXOL 350 MG/ML SOLN COMPARISON:  04/25/2013. FINDINGS: Mediastinum / Lymph Nodes: There is no axillary lymphadenopathy. Scattered small lymph nodes in the mediastinum do not meet CT criteria for pathologic enlargement. No evidence for mediastinal lymphadenopathy. The heart is upper normal for size. No pericardial effusion. The esophagus has normal imaging features. No filling defect within the opacified pulmonary arteries to suggest the presence of an acute  pulmonary embolus. No  evidence for dissection flap in the thoracic aorta. Lungs / Pleura: Moderate changes of centrilobular emphysema noted bilaterally. Bronchial wall thickening is seen diffusely in both lungs. There is bilateral dependent collapse/ consolidation in the lower lobes. Small bilateral pleural effusions are noted. Upper Abdomen:  Unremarkable. MSK / Soft Tissues: Bone windows reveal no worrisome lytic or sclerotic osseous lesions. Review of the MIP images confirms the above findings. IMPRESSION: 1. No CT evidence for acute pulmonary embolus. 2. Bilateral lower lobe collapse/consolidation with small bilateral pleural effusions. 3. Moderate changes of centrilobular emphysema. Electronically Signed   By: Kennith Center M.D.   On: 05/23/2015 18:46   Dg Chest Portable 1 View  05/23/2015  CLINICAL DATA:  Shortness of Breath EXAM: PORTABLE CHEST 1 VIEW COMPARISON:  04/22/2015 FINDINGS: Cardiac shadow is at the upper limits of normal in size. Aortic calcifications are noted. Bilateral basilar infiltrates are noted. No sizable effusion is seen. No bony abnormality is noted. IMPRESSION: Bibasilar infiltrates. Electronically Signed   By: Alcide Clever M.D.   On: 05/23/2015 10:59     ASSESSMENT AND PLAN:   Calvin Carpenter is a 82 y.o. male with a known history of hypertension, hyperlipidemia, paroxysmal atrial fibrillation, moderate to severe aortic stenosis, coronary artery disease with ischemic cardiomyopathy and systolic congestive heart failure, COPD not on any home oxygen presents to the hospital secondary to shortness of breath and chest tightness that started since yesterday.  1. Acute on chronic respiratory failure with hypoxia-due to underlying mild CHF/pneumonia. -Continue oral dose Lasix, IV ceftriaxone/Zithromax. I will add some IV steroids. -Patient likely will need to be on home oxygen prior to discharge. Patient CT scan shows extensive emphysema and bibasilar atelectasis/effusions.  2.  COPD-mild acute exacerbation. I will start IV steroids. -Continue Dulera, DuoNeb nebs as needed. -Continue O2 supplementation and will likely need to be assessed for home oxygen prior to discharge. - will refer to Pulmonary as outpatient.   3. Elevated troponin-likely demand ischemia. Mild troponin leak and elevation. -Seen by cardiology and no plans for acute intervention and they do not think the patient has underlying acute coronary syndrome. We'll DC telemetry. Currently chest pain-free.  4. Chronic Atrial fibrillation - pt. Had some accelerated rates yesterday but that's resolved now.  -continue verapamil and Toprol and rate controlled.  - not on long term anti-coagulation due to hx of AAA and will cont. ASA for now.   5. Chronic systolic CHF-well compensated at this time. No fluid overload noted.  - cont. Home dose lasix.  -Echocardiogram from October 2016 with ejection fraction of 40-45% w/ moderate to severe aortic stenosis. Cont. Care as per Cardiology.   6. Hypertension- cont.  verapamil, Norvasc, Toprol, quinapril  7. Hyperlipidemia-cont. Simvastatin.  8. Anxiety - cont. Xanax.     All the records are reviewed and case discussed with Care Management/Social Workerr. Management plans discussed with the patient, family and they are in agreement.  CODE STATUS: Full code  DVT Prophylaxis: Lovenox  TOTAL TIME TAKING CARE OF THIS PATIENT: 30 minutes.   POSSIBLE D/C IN 1-2 DAYS, DEPENDING ON CLINICAL CONDITION.   Houston Siren M.D on 05/24/2015 at 1:38 PM  Between 7am to 6pm - Pager - [phone]  After 6pm go to [email or web address] - password [email or web address] [facility]  [place] [facility]  Office  [phone]  CC: Primary care physician; Mila Merry, MD

## 2015-05-24 NOTE — Consult Note (Signed)
[facility] Cardiology Consultation Note  Patient ID: Calvin Carpenter, MRN: [id number], DOB/AGE: 80-Apr-1932 80 y.o. Admit date: 05/23/2015   Date of Consult: 05/24/2015 Primary Physician: Mila Merry, MD Primary Cardiologist: PARASCHOS  Chief Complaint:  Chief Complaint  Patient presents with  . Shortness of Breath   Reason for Consult: atrial fibrillation with rapid ventricular rate  HPI: 82 y.o. male with known apparent previous coronary artery disease without previous myocardial infarction and some cardiomyopathy having essential hypertension mixed hyperlipidemia and moderate aortic valve stenosis who is had development of significant new shortness of breath and pneumonia. The patient has had this cough congestion and pneumonia starting in the last week and had some hypoxia. With this he is developed atrial fibrillation with rapid ventricular rate exacerbating issues above. He has had a slightly elevated troponin consistent with demand ischemia rather than acute coronary syndrome and his EKG has shown atrial fibrillation with rapid ventricular rate with incomplete left bundle branch block. The patient has not had any chest pain pressure or evidence of congestive heart failure at this time. He was placed on antibiotics and is feeling somewhat better and has been using metoprolol and verapamil for treatment of heart rate control which is much better at this time area  Past Medical History  Diagnosis Date  . Myocardial infarct [facility]) 04-23-13    CAD on cardiac cath, no PCI or CABG  . History of chicken pox   . History of measles   . History of mumps   . CHF (congestive heart failure) (HCC)     systolic dysfunction, EF 40-45%  . C. difficile colitis 12/02/2014    following prolonged antibiotic treatment for post op pneumonia   . AAA (abdominal aortic aneurysm) (HCC)   . Carotid stenosis   . HTN (hypertension)   . Hyperlipidemia   . Severe aortic stenosis   . COPD (chronic obstructive  pulmonary disease) (HCC)   . Atrial fibrillation [facility])       Surgical History:  Past Surgical History  Procedure Laterality Date  . Groin surgery  2011  . Carotid endarterectomy Right 2007    Sankar  . Vascular surgery Bilateral     Aortic aneurysm stent, right femoral-left femoral bypass  . Hernia repair Left 10-29-12 and 04-23-13    inguinal  . Femoral bypass  09/15/2010    Fem-Fem Bypass; Dr. Earnestine Leys, [facility]. Bilateral with femoral endartectomies. Excised graft June 2012 dur to infection  . Abdominal aortic aneurysm repair  2009  . Cataract extraction      Right eye 2001; Left eye 2000  . Prostate surgery  1993  . Kidney stone extraction  1980  . Cardiac catheterization  10/27/2005    Severe 2 vessel CAD with occluded prox RCA with collaterals. Lx occlusion. 95% OMI, 30% Mid LAD. Mild aortic stenosis  . Doppler echocardiography  09/08/2011    Mild global LV dysfunction EF=45%, MILD lvh, mild MI, TI. Severe AS     Home Meds: Prior to Admission medications   Medication Sig Start Date End Date Taking? Authorizing Provider  albuterol (VENTOLIN HFA) 108 (90 BASE) MCG/ACT inhaler Inhale 2 puffs into the lungs every 6 (six) hours as needed for wheezing or shortness of breath.  09/04/14  Yes Historical Provider, MD  amLODipine (NORVASC) 5 MG tablet Take 1 tablet (5 mg total) by mouth daily. 05/05/15  Yes Malva Limes, MD  aspirin EC 81 MG tablet Take 81 mg by mouth every morning.   Yes Historical  Provider, MD  fluticasone (FLONASE) 50 MCG/ACT nasal spray Place 1 spray into both nostrils daily as needed for allergies or rhinitis.    Yes Historical Provider, MD  furosemide (LASIX) 20 MG tablet TAKE ONE (1) TABLET EACH DAY 03/24/15  Yes Malva Limes, MD  HYDROcodone-acetaminophen (NORCO/VICODIN) 5-325 MG tablet Take 1 tablet by mouth every 6 (six) hours as needed for moderate pain. 04/10/15  Yes Malva Limes, MD  ipratropium-albuterol (DUONEB) 0.5-2.5 (3) MG/3ML SOLN Take 3 mLs by  nebulization every 6 (six) hours as needed (for shortness of breath/wheezing.).    Yes Historical Provider, MD  metoprolol succinate (TOPROL-XL) 25 MG 24 hr tablet Take 1 tablet (25 mg total) by mouth daily. 05/05/15  Yes Malva Limes, MD  montelukast (SINGULAIR) 10 MG tablet Take 1 tablet (10 mg total) by mouth as needed. 12/02/14  Yes Malva Limes, MD  quinapril (ACCUPRIL) 20 MG tablet Take 1 tablet (20 mg total) by mouth daily. 05/05/15  Yes Malva Limes, MD  simvastatin (ZOCOR) 40 MG tablet Take 1 tablet (40 mg total) by mouth daily. 05/05/15  Yes Malva Limes, MD  verapamil (VERELAN PM) 180 MG 24 hr capsule Take 1 capsule (180 mg total) by mouth at bedtime. Patient taking differently: Take 180 mg by mouth every morning.  05/18/15  Yes Malva Limes, MD    Inpatient Medications:  . amLODipine  5 mg Oral Daily  . aspirin EC  81 mg Oral Daily  . azithromycin  500 mg Intravenous Q24H  . cefTRIAXone (ROCEPHIN)  IV  1 g Intravenous Daily  . docusate sodium  100 mg Oral BID  . enoxaparin (LOVENOX) injection  40 mg Subcutaneous Q24H  . furosemide  40 mg Oral Daily  . lisinopril  20 mg Oral Daily  . metoprolol succinate  25 mg Oral Daily  . mometasone-formoterol  2 puff Inhalation BID  . montelukast  10 mg Oral Daily  . simvastatin  40 mg Oral Daily  . sodium chloride flush  3 mL Intravenous Q12H  . verapamil  180 mg Oral QHS      Allergies:  Allergies  Allergen Reactions  . Benazepril Hcl Rash  . Fluvastatin Sodium Rash  . Mevacor [Lovastatin] Rash    Social History   Social History  . Marital Status: Married    Spouse Name: N/A  . Number of Children: N/A  . Years of Education: N/A   Occupational History  . Retired    Social History Main Topics  . Smoking status: Former Smoker -- 1.00 packs/day for 65 years    Types: Cigarettes    Quit date: 04/26/2011  . Smokeless tobacco: Never Used     Comment: Quit 2 years ago  . Alcohol Use: No  . Drug Use: No  . Sexual  Activity: Not on file   Other Topics Concern  . Not on file   Social History Narrative   Lives at home with wife, Independent at baseline. Drives still.     Family History  Problem Relation Age of Onset  . Diabetes Brother   . Diabetes Mother   . Ovarian cancer Mother   . Lung cancer Father      Review of Systems Positive for shortness of breath cough congestion Negative for: General:  chills, fever, night sweats or weight changes.  Cardiovascular: PND orthopnea syncope dizziness  Dermatological skin lesions rashes Respiratory: Positive for Cough congestion Urologic: Frequent urination urination at night and hematuria Abdominal:  negative for nausea, vomiting, diarrhea, bright red blood per rectum, melena, or hematemesis Neurologic: negative for visual changes, and/or hearing changes  All other systems reviewed and are otherwise negative except as noted above.  Labs:  Recent Labs  05/23/15 1036 05/23/15 1441 05/23/15 2008 05/24/15 0240  TROPONINI 0.10* 0.17* 0.18* 0.25*   Lab Results  Component Value Date   WBC 18.3* 05/23/2015   HGB 12.3* 05/23/2015   HCT 37.3* 05/23/2015   MCV 93.7 05/23/2015   PLT 199 05/23/2015    Recent Labs Lab 05/23/15 1036  NA 140  K 4.3  CL 105  CO2 23  BUN 20  CREATININE 0.89  CALCIUM 9.1  PROT 7.3  BILITOT 1.1  ALKPHOS 47  ALT 25  AST 25  GLUCOSE 161*   Lab Results  Component Value Date   CHOL 155 01/29/2015   HDL 50 01/29/2015   LDLCALC 69 01/29/2015   TRIG 178* 01/29/2015   No results found for: DDIMER  Radiology/Studies:  Ct Angio Chest Pe W/cm &/or Wo Cm  05/23/2015  CLINICAL DATA:  Difficulty breathing. EXAM: CT ANGIOGRAPHY CHEST WITH CONTRAST TECHNIQUE: Multidetector CT imaging of the chest was performed using the standard protocol during bolus administration of intravenous contrast. Multiplanar CT image reconstructions and MIPs were obtained to evaluate the vascular anatomy. CONTRAST:  75mL OMNIPAQUE IOHEXOL  350 MG/ML SOLN COMPARISON:  04/25/2013. FINDINGS: Mediastinum / Lymph Nodes: There is no axillary lymphadenopathy. Scattered small lymph nodes in the mediastinum do not meet CT criteria for pathologic enlargement. No evidence for mediastinal lymphadenopathy. The heart is upper normal for size. No pericardial effusion. The esophagus has normal imaging features. No filling defect within the opacified pulmonary arteries to suggest the presence of an acute pulmonary embolus. No evidence for dissection flap in the thoracic aorta. Lungs / Pleura: Moderate changes of centrilobular emphysema noted bilaterally. Bronchial wall thickening is seen diffusely in both lungs. There is bilateral dependent collapse/ consolidation in the lower lobes. Small bilateral pleural effusions are noted. Upper Abdomen:  Unremarkable. MSK / Soft Tissues: Bone windows reveal no worrisome lytic or sclerotic osseous lesions. Review of the MIP images confirms the above findings. IMPRESSION: 1. No CT evidence for acute pulmonary embolus. 2. Bilateral lower lobe collapse/consolidation with small bilateral pleural effusions. 3. Moderate changes of centrilobular emphysema. Electronically Signed   By: Kennith Center M.D.   On: 05/23/2015 18:46   Dg Chest Portable 1 View  05/23/2015  CLINICAL DATA:  Shortness of Breath EXAM: PORTABLE CHEST 1 VIEW COMPARISON:  04/22/2015 FINDINGS: Cardiac shadow is at the upper limits of normal in size. Aortic calcifications are noted. Bilateral basilar infiltrates are noted. No sizable effusion is seen. No bony abnormality is noted. IMPRESSION: Bibasilar infiltrates. Electronically Signed   By: Alcide Clever M.D.   On: 05/23/2015 10:59    EKG: Atrial fibrillation with rapid ventricular rate and incomplete left bundle branch block  Weights: Filed Weights   05/23/15 1013  Weight: 159 lb (72.122 kg)     Physical Exam: Blood pressure 95/51, pulse 69, temperature 97.7 F (36.5 C), temperature source Oral, resp.  rate 18, height 5\' 7"  (1.702 m), weight 159 lb (72.122 kg), SpO2 95 %. Body mass index is 24.9 kg/(m^2). General: Well developed, well nourished, in no acute distress. Head eyes ears nose throat: Normocephalic, atraumatic, sclera non-icteric, no xanthomas, nares are without discharge. No apparent thyromegaly and/or mass  Lungs: Normal respiratory effort.  Diffuse wheezes, no rales, some rhonchi.  Heart: Irregular with  normal S1 soft S2. 3-4+ aortic murmur gallop, no rub, PMI is normal size and placement, carotid upstroke normal with  bruit, jugular venous pressure is normal Abdomen: Soft, non-tender, non-distended with normoactive bowel sounds. No hepatomegaly. No rebound/guarding. No obvious abdominal masses. Abdominal aorta is normal size without bruit Extremities: Trace edema. no cyanosis, no clubbing, no ulcers  Peripheral : 2+ bilateral upper extremity pulses, 2+ bilateral femoral pulses, 2+ bilateral dorsal pedal pulse Neuro: Alert and oriented. No facial asymmetry. No focal deficit. Moves all extremities spontaneously. Musculoskeletal: Normal muscle tone without kyphosis Psych:  Responds to questions appropriately with a normal affect.    Assessment: 82 year old male with known coronary artery disease mild LV systolic dysfunction with cardiomyopathy essential hypertension mixed hyperlipidemia and moderate calcific aortic valve stenosis with new onset nonvalvular atrial fibrillation with rapid ventricular rate secondary to acute pneumonia and no current evidence of acute coronary syndrome or cardial infarction despite minimal elevation of troponin levels   Plan: 1. Continue metoprolol and verapamil for heart rate control of atrial fibrillation and follow for possible spontaneous conversion to normal sinus rhythm   2. Lasix for any pulmonary edema due to concerns of cardiomyopathy as well as recent pneumonia 3. Hypertension control with ACE inhibitor and amlodipine 4. High intensity  cholesterol therapy with coronary atherosclerosis 5. Echocardiogram for LV systolic dysfunction valvular heart disease changes causing any symptoms listed above 6. Patient should have the anticoagulation with subcutaneous heparin until possible spontaneous conversion to normal sinus rhythm and then would consider the possibility of oral anticoagulation depending on above 7. Further diagnostic testing and treatment options after above  Signed, Lamar Blinks M.D. [facility] Card[facility] 05/24/2015, 6:58 AM

## 2015-05-24 NOTE — Progress Notes (Signed)
Patient request something to help him sleep tonight. Dr Cherlynn Kaiser notified, 5 mg Ambien ordered.

## 2015-05-24 NOTE — Progress Notes (Signed)
Notified MD Sheryle Hail that patient had a 5 beat run of V- tach, also notified that troponin was elevated 0.25. MD acknowledged no orders received at this time.

## 2015-05-24 NOTE — Progress Notes (Signed)
Patient called out wanting something to relax him. Notified MD Sheryle Hail. Orders received.

## 2015-05-25 LAB — CBC
HCT: 34.5 % — ABNORMAL LOW (ref 40.0–52.0)
Hemoglobin: 11.5 g/dL — ABNORMAL LOW (ref 13.0–18.0)
MCH: 31.1 pg (ref 26.0–34.0)
MCHC: 33.3 g/dL (ref 32.0–36.0)
MCV: 93.3 fL (ref 80.0–100.0)
PLATELETS: 201 10*3/uL (ref 150–440)
RBC: 3.69 MIL/uL — AB (ref 4.40–5.90)
RDW: 14.4 % (ref 11.5–14.5)
WBC: 16.3 10*3/uL — AB (ref 3.8–10.6)

## 2015-05-25 LAB — BASIC METABOLIC PANEL
ANION GAP: 6 (ref 5–15)
BUN: 29 mg/dL — AB (ref 6–20)
CALCIUM: 9.1 mg/dL (ref 8.9–10.3)
CO2: 28 mmol/L (ref 22–32)
Chloride: 104 mmol/L (ref 101–111)
Creatinine, Ser: 0.98 mg/dL (ref 0.61–1.24)
GFR calc Af Amer: >60 mL/min (ref >60)
GLUCOSE: 183 mg/dL — AB (ref 65–99)
POTASSIUM: 4.7 mmol/L (ref 3.5–5.1)
SODIUM: 138 mmol/L (ref 135–145)

## 2015-05-25 MED ORDER — PREDNISONE 10 MG PO TABS: ORAL_TABLET | ORAL | Status: DC

## 2015-05-25 MED ORDER — LEVOFLOXACIN 500 MG PO TABS: 500.0000 mg | ORAL_TABLET | Freq: Every day | ORAL | Status: DC

## 2015-05-25 MED ORDER — BUDESONIDE-FORMOTEROL FUMARATE 80-4.5 MCG/ACT IN AERO: 2.0000 | INHALATION_SPRAY | Freq: Two times a day (BID) | RESPIRATORY_TRACT | Status: AC

## 2015-05-25 NOTE — Progress Notes (Signed)
SATURATION QUALIFICATIONS: (This note is used to comply with regulatory documentation for home oxygen)  Patient Saturations on Room Air at Rest =94%  Patient Saturations on Room Air while Ambulate 88 %  Patient Saturations on2Liters of oxygen while Ambulating =95*%  Please briefly explain why patient needs home oxygen:

## 2015-05-25 NOTE — Progress Notes (Signed)
Patient is discharge home with 02 [place] in a stable condition , summary and f/u care given , verbalized understanding ,left with daughter

## 2015-05-25 NOTE — Care Management (Signed)
Patient is for discharge home today.  He has qualified for home 02 and is agreeable  to home health nursing.  Agency preference is [facility] for both.  Referrals called.  Portable 02 tank to be delivered to patient's room

## 2015-05-25 NOTE — Care Management Important Message (Signed)
Important Message  Patient Details  Name: Calvin Carpenter MRN: [id number] Date of Birth: 1930-11-16   Medicare Important Message Given:  Yes    Olegario Messier A Shanta Hartner 05/25/2015, 10:08 AM

## 2015-05-25 NOTE — Progress Notes (Signed)
[facility] Cardiology [facility] Encounter Note  Patient: Calvin Carpenter / Admit Date: 05/23/2015 / Date of Encounter: 05/25/2015, 7:31 AM   Subjective: Patient is breathing much better at this time. No evidence of chest pain. Heart rate is much better controlled with current medical regimen  Review of Systems: Positive for: Shortness of breath Negative for: Vision change, hearing change, syncope, dizziness, nausea, vomiting,diarrhea, bloody stool, stomach pain, cough, congestion, diaphoresis, urinary frequency, urinary pain,skin lesions, skin rashes Others previously listed  Objective: Telemetry: Real fibrillation with controlled ventricular rate Physical Exam: Blood pressure 115/75, pulse 91, temperature 97.4 F (36.3 C), temperature source Oral, resp. rate 18, height  (1.702 m), weight 159 lb (72.122 kg), SpO2 97 %. Body mass index is 24.9 kg/(m^2). General: Well developed, well nourished, in no acute distress. Head: Normocephalic, atraumatic, sclera non-icteric, no xanthomas, nares are without discharge. Neck: No apparent masses Lungs: Normal respirations with no wheezes, diffuse rhonchi, no rales , few crackles   Heart: Irregular rate and rhythm, normal S1 soft S2, no re-to 4+ aortic murmur, no rub, no gallop, PMI is normal size and placement, carotid upstroke normal with  bruit, jugular venous pressure normal Abdomen: Soft, non-tender, non-distended with normoactive bowel sounds. No hepatosplenomegaly. Abdominal aorta is normal size without bruit Extremities: No edema, no clubbing, no cyanosis, no ulcers,  Peripheral: 2+ radial, 2+ femoral, 2+ dorsal pedal pulses Neuro: Alert and oriented. Moves all extremities spontaneously. Psych:  Responds to questions appropriately with a normal affect.   Intake/Output Summary (Last 24 hours) at 05/25/15 0731 Last data filed at 05/25/15 0510  Gross per 24 hour  Intake    480 ml  Output   2100 ml  Net  -1620 ml    Inpatient  Medications:  . amLODipine  5 mg Oral Daily  . aspirin EC  81 mg Oral Daily  . azithromycin  500 mg Intravenous Q24H  . cefTRIAXone (ROCEPHIN)  IV  1 g Intravenous Daily  . docusate sodium  100 mg Oral BID  . enoxaparin (LOVENOX) injection  40 mg Subcutaneous Q24H  . furosemide  40 mg Oral Daily  . lisinopril  20 mg Oral Daily  . methylPREDNISolone (SOLU-MEDROL) injection  40 mg Intravenous Q12H  . metoprolol succinate  25 mg Oral Daily  . mometasone-formoterol  2 puff Inhalation BID  . montelukast  10 mg Oral Daily  . simvastatin  40 mg Oral Daily  . sodium chloride flush  3 mL Intravenous Q12H  . verapamil  180 mg Oral QHS   Infusions:    Labs:  Recent Labs  05/23/15 1036 05/25/15 0411  NA 140 138  K 4.3 4.7  CL 105 104  CO2 23 28  GLUCOSE 161* 183*  BUN 20 29*  CREATININE 0.89 0.98  CALCIUM 9.1 9.1    Recent Labs  05/23/15 1036  AST 25  ALT 25  ALKPHOS 47  BILITOT 1.1  PROT 7.3  ALBUMIN 4.1    Recent Labs  05/23/15 1036 05/25/15 0411  WBC 18.3* 16.3*  HGB 12.3* 11.5*  HCT 37.3* 34.5*  MCV 93.7 93.3  PLT 199 201    Recent Labs  05/23/15 1036 05/23/15 1441 05/23/15 2008 05/24/15 0240  TROPONINI 0.10* 0.17* 0.18* 0.25*   Invalid input(s): POCBNP No results for input(s): HGBA1C in the last 72 hours.   Weights: Filed Weights   05/23/15 1013  Weight: 159 lb (72.122 kg)     Radiology/Studies:  Ct Angio Chest Pe W/cm &/or Wo  Cm  05/23/2015  CLINICAL DATA:  Difficulty breathing. EXAM: CT ANGIOGRAPHY CHEST WITH CONTRAST TECHNIQUE: Multidetector CT imaging of the chest was performed using the standard protocol during bolus administration of intravenous contrast. Multiplanar CT image reconstructions and MIPs were obtained to evaluate the vascular anatomy. CONTRAST:  75mL OMNIPAQUE IOHEXOL 350 MG/ML SOLN COMPARISON:  04/25/2013. FINDINGS: Mediastinum / Lymph Nodes: There is no axillary lymphadenopathy. Scattered small lymph nodes in the mediastinum  do not meet CT criteria for pathologic enlargement. No evidence for mediastinal lymphadenopathy. The heart is upper normal for size. No pericardial effusion. The esophagus has normal imaging features. No filling defect within the opacified pulmonary arteries to suggest the presence of an acute pulmonary embolus. No evidence for dissection flap in the thoracic aorta. Lungs / Pleura: Moderate changes of centrilobular emphysema noted bilaterally. Bronchial wall thickening is seen diffusely in both lungs. There is bilateral dependent collapse/ consolidation in the lower lobes. Small bilateral pleural effusions are noted. Upper Abdomen:  Unremarkable. MSK / Soft Tissues: Bone windows reveal no worrisome lytic or sclerotic osseous lesions. Review of the MIP images confirms the above findings. IMPRESSION: 1. No CT evidence for acute pulmonary embolus. 2. Bilateral lower lobe collapse/consolidation with small bilateral pleural effusions. 3. Moderate changes of centrilobular emphysema. Electronically Signed   By: Kennith Center M.D.   On: 05/23/2015 18:46   Dg Chest Portable 1 View  05/23/2015  CLINICAL DATA:  Shortness of Breath EXAM: PORTABLE CHEST 1 VIEW COMPARISON:  04/22/2015 FINDINGS: Cardiac shadow is at the upper limits of normal in size. Aortic calcifications are noted. Bilateral basilar infiltrates are noted. No sizable effusion is seen. No bony abnormality is noted. IMPRESSION: Bibasilar infiltrates. Electronically Signed   By: Alcide Clever M.D.   On: 05/23/2015 10:59     Assessment and Recommendation  82 y.o. male with known essential hypertension mixed hyperlipidemia moderate aortic valve stenosis and apparent cardiomyopathy and acute infection and exacerbation causing elevated troponin consistent with demand ischemia and no current evidence of myocardial infarction acute coronary syndrome 1. Continue current medical regimen for heart rate control including metoprolol and verapamil 2. Subcutaneous  heparin until spontaneous conversion to normal sinus rhythm and would consider addition of medication management including long-term anticoagulation using apixaban 5 mg twice per day 3. Hypertension control with amlodipine 4. Intensity cholesterol therapy for cardiovascular disease   5. Again ambulation and follow for need for adjustments of medication management  Signed, Arnoldo Hooker M.D. FACC

## 2015-05-25 NOTE — Discharge Summary (Signed)
[facility] - [place] at [facility]   PATIENT NAME: Calvin Carpenter    MR#:  [id number]  DATE OF BIRTH:  [birth year]  DATE OF ADMISSION:  05/23/2015 ADMITTING PHYSICIAN: Enid Baas, MD  DATE OF DISCHARGE: 05/25/2015  3:13 PM  PRIMARY CARE PHYSICIAN: Mila Merry, MD    ADMISSION DIAGNOSIS:  Community acquired pneumonia [J18.9] NSTEMI (non-ST elevated myocardial infarction) (HCC) [I21.4] Atrial fibrillation with rapid ventricular response (HCC) [I48.91]  DISCHARGE DIAGNOSIS:  Active Problems:   A-fib (HCC)   SECONDARY DIAGNOSIS:   Past Medical History  Diagnosis Date  . Myocardial infarct [facility]) 04-23-13    CAD on cardiac cath, no PCI or CABG  . History of chicken pox   . History of measles   . History of mumps   . CHF (congestive heart failure) (HCC)     systolic dysfunction, EF 40-45%  . C. difficile colitis 12/02/2014    following prolonged antibiotic treatment for post op pneumonia   . AAA (abdominal aortic aneurysm) (HCC)   . Carotid stenosis   . HTN (hypertension)   . Hyperlipidemia   . Severe aortic stenosis   . COPD (chronic obstructive pulmonary disease) (HCC)   . Atrial fibrillation [facility])     HOSPITAL COURSE:   [name] is a 82 y.o. male with a known history of hypertension, hyperlipidemia, paroxysmal atrial fibrillation, moderate to severe aortic stenosis, coronary artery disease with ischemic cardiomyopathy and systolic congestive heart failure, COPD not on any home oxygen presents to the hospital secondary to shortness of breath and chest tightness.    1. Acute on chronic respiratory failure with hypoxia- this was due to underlying mild CHF/pneumonia. -Initially patient was given IV Lasix, and switched over to his oral dose of Lasix and responded well to it. -Patient was also empirically placed on IV ceftriaxone and Zithromax for the pneumonia and also given some IV steroids. After aggressive therapy patient's clinical  symptoms have improved. -His CT scan of his chest was consistent with extensive centrilobular emphysema and therefore he was ambulated room air and qualified for home oxygen which was arranged prior to discharge. -Patient is not being discharged on oral antibiotics for pneumonia, Lasix, home oxygen and a prednisone taper.  2. COPD-mild acute exacerbation. He was started on some IV steroids and maintained on antibiotics. -He has clinically improved since admission. He is being discharged on oral prednisone taper, Symbicort, and empiric antibiotics and home oxygen. He would benefit from being referred to a pulmonologist as an outpatient which can be done through his primary care physician..   3. Elevated troponin-this was likely demand ischemia.  -he was Seen by cardiology and they had no plans for acute intervention and they do not think the patient has underlying acute coronary syndrome. Currently chest pain-free.  4. Chronic Atrial fibrillation - pt. Had some accelerated rates while in the hospital which have now resolved.  - he will continue verapamil and Toprol and rate controlled.  - not on long term anti-coagulation due to hx of AAA and will cont. ASA for now.  Further long-term anticoagulation to be addressed by his Cardiologist as outpatient.  5. Chronic systolic CHF-well compensated while in the hospital. No fluid overload noted.  - he will cont. Home dose lasix.  -Echocardiogram from October 2016 with ejection fraction of 40-45% w/ moderate to severe aortic stenosis.  6. Hypertension- he will cont. verapamil, Norvasc, Toprol, quinapril  7. Hyperlipidemia- He will cont. Simvastatin.  8. Anxiety - he will  cont. Xanax.    DISCHARGE CONDITIONS:   Stable  CONSULTS OBTAINED:  Treatment Team:  Lamar Blinks, MD  DRUG ALLERGIES:   Allergies  Allergen Reactions  . Benazepril Hcl Rash  . Fluvastatin Sodium Rash  . Mevacor [Lovastatin] Rash    DISCHARGE MEDICATIONS:    Discharge Medication List as of 05/25/2015 12:46 PM    START taking these medications   Details  budesonide-formoterol (SYMBICORT) 80-4.5 MCG/ACT inhaler Inhale 2 puffs into the lungs 2 (two) times daily., Starting 05/25/2015, Until Discontinued, Print    levofloxacin (LEVAQUIN) 500 MG tablet Take 1 tablet (500 mg total) by mouth daily., Starting 05/25/2015, Until Discontinued, Print      CONTINUE these medications which have CHANGED   Details  predniSONE (DELTASONE) 10 MG tablet Label  & dispense according to the schedule below. 5 Pills PO for 1 day then, 4 Pills PO for 1 day, 3 Pills PO for 1 day, 2 Pills PO for 1 day, 1 Pill PO for 1 days then STOP., Print      CONTINUE these medications which have NOT CHANGED   Details  albuterol (VENTOLIN HFA) 108 (90 BASE) MCG/ACT inhaler Inhale 2 puffs into the lungs every 6 (six) hours as needed for wheezing or shortness of breath. , Starting 09/04/2014, Until Discontinued, Historical Med    amLODipine (NORVASC) 5 MG tablet Take 1 tablet (5 mg total) by mouth daily., Starting 05/05/2015, Until Discontinued, Normal    aspirin EC 81 MG tablet Take 81 mg by mouth every morning., Until Discontinued, Historical Med    fluticasone (FLONASE) 50 MCG/ACT nasal spray Place 1 spray into both nostrils daily as needed for allergies or rhinitis. , Until Discontinued, Historical Med    furosemide (LASIX) 20 MG tablet TAKE ONE (1) TABLET EACH DAY, Normal    HYDROcodone-acetaminophen (NORCO/VICODIN) 5-325 MG tablet Take 1 tablet by mouth every 6 (six) hours as needed for moderate pain., Starting 04/10/2015, Until Discontinued, Print    ipratropium-albuterol (DUONEB) 0.5-2.5 (3) MG/3ML SOLN Take 3 mLs by nebulization every 6 (six) hours as needed (for shortness of breath/wheezing.). , Until Discontinued, Historical Med    metoprolol succinate (TOPROL-XL) 25 MG 24 hr tablet Take 1 tablet (25 mg total) by mouth daily., Starting 05/05/2015, Until Discontinued, Normal     montelukast (SINGULAIR) 10 MG tablet Take 1 tablet (10 mg total) by mouth as needed., Starting 12/02/2014, Until Discontinued, Print    quinapril (ACCUPRIL) 20 MG tablet Take 1 tablet (20 mg total) by mouth daily., Starting 05/05/2015, Until Discontinued, Normal    simvastatin (ZOCOR) 40 MG tablet Take 1 tablet (40 mg total) by mouth daily., Starting 05/05/2015, Until Discontinued, Normal    verapamil (VERELAN PM) 180 MG 24 hr capsule Take 1 capsule (180 mg total) by mouth at bedtime., Starting 05/18/2015, Until Discontinued, No Print      STOP taking these medications     aspirin 81 MG tablet          DISCHARGE INSTRUCTIONS:   DIET:  Cardiac diet  DISCHARGE CONDITION:  Stable  ACTIVITY:  Activity as tolerated  OXYGEN:  Home Oxygen: Yes.     Oxygen Delivery: 2 liters/min via Patient connected to nasal cannula oxygen  DISCHARGE LOCATION:  Home with home health nursing   If you experience worsening of your admission symptoms, develop shortness of breath, life threatening emergency, suicidal or homicidal thoughts you must seek medical attention immediately by calling 911 or calling your MD immediately  if symptoms less severe.  You Must read complete instructions/literature along with all the possible adverse reactions/side effects for all the Medicines you take and that have been prescribed to you. Take any new Medicines after you have completely understood and accpet all the possible adverse reactions/side effects.   Please note  You were cared for by a hospitalist during your hospital stay. If you have any questions about your discharge medications or the care you received while you were in the hospital after you are discharged, you can call the unit and asked to speak with the hospitalist on call if the hospitalist that took care of you is not available. Once you are discharged, your primary care physician will handle any further medical issues. Please note that NO REFILLS  for any discharge medications will be authorized once you are discharged, as it is imperative that you return to your primary care physician (or establish a relationship with a primary care physician if you do not have one) for your aftercare needs so that they can reassess your need for medications and monitor your lab values.     Today   Shortness of breath is improved. No cough, fever, chills.  VITAL SIGNS:  Blood pressure 120/62, pulse 73, temperature 97.9 F (36.6 C), temperature source Oral, resp. rate 18, height  (1.702 m), weight 72.122 kg (159 lb), SpO2 94 %.  I/O:   Intake/Output Summary (Last 24 hours) at 05/25/15 1553 Last data filed at 05/25/15 1400  Gross per 24 hour  Intake   1740 ml  Output   1100 ml  Net    640 ml    PHYSICAL EXAMINATION:   GENERAL: 82 y.o.-year-old patient lying in the bed with no acute distress.  EYES: Pupils equal, round, reactive to light and accommodation. No scleral icterus. Extraocular muscles intact.  HEENT: Head atraumatic, normocephalic. Oropharynx and nasopharynx clear.  NECK: Supple, no jugular venous distention. No thyroid enlargement, no tenderness.  LUNGS: Good air entry bilaterally, bibasilar rales. No rhonchi, wheezing. No use of accessory muscles of respiration.  CARDIOVASCULAR: S1, S2 normal. II/VI SEM at LSB, rubs, or gallops.  ABDOMEN: Soft, nontender, nondistended. Bowel sounds present. No organomegaly or mass.  EXTREMITIES: No cyanosis, clubbing or edema b/l.  NEUROLOGIC: Cranial nerves II through XII are intact. No focal Motor or sensory deficits b/l.  PSYCHIATRIC: The patient is alert and oriented x 3. Good affect.  SKIN: No obvious rash, lesion, or ulcer.    DATA REVIEW:   CBC  Recent Labs Lab 05/25/15 0411  WBC 16.3*  HGB 11.5*  HCT 34.5*  PLT 201    Chemistries   Recent Labs Lab 05/23/15 1036 05/25/15 0411  NA 140 138  K 4.3 4.7  CL 105 104  CO2 23 28  GLUCOSE 161* 183*  BUN  20 29*  CREATININE 0.89 0.98  CALCIUM 9.1 9.1  AST 25  --   ALT 25  --   ALKPHOS 47  --   BILITOT 1.1  --     Cardiac Enzymes  Recent Labs Lab 05/24/15 0240  TROPONINI 0.25*    Microbiology Results  Results for orders placed or performed during the hospital encounter of 05/23/15  Blood culture (routine x 2)     Status: None (Preliminary result)   Collection Time: 05/23/15 11:41 AM  Result Value Ref Range Status   Specimen Description BLOOD LEFT ANTECUBITAL  Final   Special Requests BOTTLES DRAWN AEROBIC AND ANAEROBIC  1CC  Final   Culture NO GROWTH 2  DAYS  Final   Report Status PENDING  Incomplete  Blood culture (routine x 2)     Status: None (Preliminary result)   Collection Time: 05/23/15 11:41 AM  Result Value Ref Range Status   Specimen Description BLOOD RIGHT ANTECUBITAL  Final   Special Requests BOTTLES DRAWN AEROBIC AND ANAEROBIC  2CC  Final   Culture NO GROWTH 2 DAYS  Final   Report Status PENDING  Incomplete    RADIOLOGY:  Ct Angio Chest Pe W/cm &/or Wo Cm  05/23/2015  CLINICAL DATA:  Difficulty breathing. EXAM: CT ANGIOGRAPHY CHEST WITH CONTRAST TECHNIQUE: Multidetector CT imaging of the chest was performed using the standard protocol during bolus administration of intravenous contrast. Multiplanar CT image reconstructions and MIPs were obtained to evaluate the vascular anatomy. CONTRAST:  75mL OMNIPAQUE IOHEXOL 350 MG/ML SOLN COMPARISON:  04/25/2013. FINDINGS: Mediastinum / Lymph Nodes: There is no axillary lymphadenopathy. Scattered small lymph nodes in the mediastinum do not meet CT criteria for pathologic enlargement. No evidence for mediastinal lymphadenopathy. The heart is upper normal for size. No pericardial effusion. The esophagus has normal imaging features. No filling defect within the opacified pulmonary arteries to suggest the presence of an acute pulmonary embolus. No evidence for dissection flap in the thoracic aorta. Lungs / Pleura: Moderate changes of  centrilobular emphysema noted bilaterally. Bronchial wall thickening is seen diffusely in both lungs. There is bilateral dependent collapse/ consolidation in the lower lobes. Small bilateral pleural effusions are noted. Upper Abdomen:  Unremarkable. MSK / Soft Tissues: Bone windows reveal no worrisome lytic or sclerotic osseous lesions. Review of the MIP images confirms the above findings. IMPRESSION: 1. No CT evidence for acute pulmonary embolus. 2. Bilateral lower lobe collapse/consolidation with small bilateral pleural effusions. 3. Moderate changes of centrilobular emphysema. Electronically Signed   By: Kennith Center M.D.   On: 05/23/2015 18:46      Management plans discussed with the patient, family and they are in agreement.  CODE STATUS:     Code Status Orders        Start     Ordered   05/23/15 1400  Full code   Continuous     05/23/15 1359    Code Status History    Date Active Date Inactive Code Status Order ID Comments User Context   04/16/2015 11:49 AM 04/17/2015  3:01 PM Full Code [id number]  Arnaldo Natal, MD ED      TOTAL TIME TAKING CARE OF THIS PATIENT: 40 minutes.    Houston Siren M.D on 05/25/2015 at 3:53 PM  Between 7am to 6pm - Pager - [phone]  After 6pm go to [email or web address] - password [email or web address] [facility]  [facility]  Office  [phone]  CC: Primary care physician; Mila Merry, MD

## 2015-05-26 NOTE — Progress Notes (Signed)
°   05/25/15 1408  Clinical Encounter Type  Visited With Patient and family together  Visit Type Follow-up  Referral From Nurse  Consult/Referral To Chaplain  Spiritual Encounters  Spiritual Needs Other (Comment)  Completed AD documents. Chaplain [employer] Ext [phone]

## 2015-05-27 ENCOUNTER — Telehealth: Payer: Self-pay | Admitting: Family Medicine

## 2015-05-27 ENCOUNTER — Other Ambulatory Visit: Payer: Self-pay | Admitting: Internal Medicine

## 2015-05-27 DIAGNOSIS — J189 Pneumonia, unspecified organism: Secondary | ICD-10-CM

## 2015-05-28 LAB — CULTURE, BLOOD (ROUTINE X 2)
CULTURE: NO GROWTH
Culture: NO GROWTH

## 2015-05-29 ENCOUNTER — Ambulatory Visit (INDEPENDENT_AMBULATORY_CARE_PROVIDER_SITE_OTHER): Payer: PPO | Admitting: Family Medicine

## 2015-05-29 ENCOUNTER — Ambulatory Visit
Admission: RE | Admit: 2015-05-29 | Discharge: 2015-05-29 | Disposition: A | Discharge status: Home / Self Care | Payer: PPO | Source: Ambulatory Visit | Attending: Family Medicine | Admitting: Family Medicine

## 2015-05-29 ENCOUNTER — Encounter: Payer: Self-pay | Admitting: Family Medicine

## 2015-05-29 VITALS — BP 110/56 | HR 87 | Temp 97.7°F | Resp 18 | Wt 156.0 lb

## 2015-05-29 DIAGNOSIS — J189 Pneumonia, unspecified organism: Secondary | ICD-10-CM | POA: Insufficient documentation

## 2015-05-29 DIAGNOSIS — I429 Cardiomyopathy, unspecified: Secondary | ICD-10-CM | POA: Diagnosis not present

## 2015-05-29 DIAGNOSIS — I2510 Atherosclerotic heart disease of native coronary artery without angina pectoris: Secondary | ICD-10-CM | POA: Diagnosis not present

## 2015-05-29 DIAGNOSIS — J449 Chronic obstructive pulmonary disease, unspecified: Secondary | ICD-10-CM | POA: Diagnosis not present

## 2015-05-29 DIAGNOSIS — I10 Essential (primary) hypertension: Secondary | ICD-10-CM

## 2015-05-29 DIAGNOSIS — I480 Paroxysmal atrial fibrillation: Secondary | ICD-10-CM

## 2015-05-29 DIAGNOSIS — Z87891 Personal history of nicotine dependence: Secondary | ICD-10-CM | POA: Diagnosis not present

## 2015-05-29 MED ORDER — VERAPAMIL HCL ER 180 MG PO TBCR: 180.0000 mg | EXTENDED_RELEASE_TABLET | Freq: Every day | ORAL | Status: AC

## 2015-05-29 NOTE — Progress Notes (Signed)
Patient: Calvin Carpenter Male    DOB: 1930-10-05   82 y.o.   MRN: [id number] Visit Date: 05/29/2015  Today's Provider: Mila Merry, MD   Chief Complaint  Patient presents with  . Hospitalization Follow-up  . Pneumonia  . Atrial Fibrillation   Subjective:    HPI   Follow up Hospitalization  He was admitted on 05/23/2015 with bilateral pneumonia. He was found to have elevated troponin levels and rapid a-fib. He was evaluated by Dr. Gwen Pounds and is scheduled for routing follow up with Dr. Darrold Junker for established CVD and aortic stenosis in April. He apparently had episode of atrial in the past after hernia surgery, but had been consistently in NSR prior to hospitalization. He was also referred to pulmonology since he has had 2 hospitalizations since December and moderate COPD.  He states he feels much better since discharge. He also reports consuming excessive sodium prior to hospitalization which may have contributed to his symptoms. He checks his weight daily and states it has been stable since discharge.      Allergies  Allergen Reactions  . Benazepril Hcl Rash  . Fluvastatin Sodium Rash  . Mevacor [Lovastatin] Rash   Previous Medications   ALBUTEROL (VENTOLIN HFA) 108 (90 BASE) MCG/ACT INHALER    Inhale 2 puffs into the lungs every 6 (six) hours as needed for wheezing or shortness of breath.    AMLODIPINE (NORVASC) 5 MG TABLET    Take 1 tablet (5 mg total) by mouth daily.   ASPIRIN EC 81 MG TABLET    Take 81 mg by mouth every morning.   BUDESONIDE-FORMOTEROL (SYMBICORT) 80-4.5 MCG/ACT INHALER    Inhale 2 puffs into the lungs 2 (two) times daily.   FLUTICASONE (FLONASE) 50 MCG/ACT NASAL SPRAY    Place 1 spray into both nostrils daily as needed for allergies or rhinitis.    FUROSEMIDE (LASIX) 20 MG TABLET    TAKE ONE (1) TABLET EACH DAY   HYDROCODONE-ACETAMINOPHEN (NORCO/VICODIN) 5-325 MG TABLET    Take 1 tablet by mouth every 6 (six) hours as needed for moderate pain.   IPRATROPIUM-ALBUTEROL (DUONEB) 0.5-2.5 (3) MG/3ML SOLN    Take 3 mLs by nebulization every 6 (six) hours as needed (for shortness of breath/wheezing.).    METOPROLOL SUCCINATE (TOPROL-XL) 25 MG 24 HR TABLET    Take 1 tablet (25 mg total) by mouth daily.   MONTELUKAST (SINGULAIR) 10 MG TABLET    Take 1 tablet (10 mg total) by mouth as needed.   QUINAPRIL (ACCUPRIL) 20 MG TABLET    Take 1 tablet (20 mg total) by mouth daily.   SIMVASTATIN (ZOCOR) 40 MG TABLET    Take 1 tablet (40 mg total) by mouth daily.   VERAPAMIL (VERELAN PM) 180 MG 24 HR CAPSULE    Take 1 capsule (180 mg total) by mouth at bedtime.    Review of Systems  Constitutional: Positive for fatigue. Negative for fever, chills and appetite change.  Respiratory: Negative for chest tightness, shortness of breath and wheezing.   Cardiovascular: Positive for palpitations and leg swelling (minimal). Negative for chest pain.  Gastrointestinal: Negative for nausea, vomiting and abdominal pain.  Neurological: Positive for weakness.    Social History  Substance Use Topics  . Smoking status: Former Smoker -- 1.00 packs/day for 65 years    Types: Cigarettes    Quit date: 04/26/2011  . Smokeless tobacco: Never Used     Comment: Quit 2 years ago  .  Alcohol Use: No   Objective:   BP 110/56 mmHg  Pulse 87  Temp(Src) 97.7 F (36.5 C) (Oral)  Resp 18  Wt 156 lb (70.761 kg)  SpO2 96%  Physical Exam   General Appearance:    Alert, cooperative, no distress  Eyes:    PERRL, conjunctiva/corneas clear, EOM's intact       Lungs:     Clear to auscultation bilaterally, respirations unlabored  Heart:    Regular rate and rhythm, III/VI systolic murmur RUSB.   Neurologic:   Awake, alert, oriented x 3. No apparent focal neurological           defect.       EKG SR with frequent PVCs, changes consistent with LVH.      Assessment & Plan:     1. Paroxysmal atrial fibrillation (HCC) Likely triggered by acute illness (pneumonia), but now  staying in sinus rhythm.  Follow up in March to make sure staying in SR. Continue regular follow up with cardiologist.  - EKG 12-Lead  2. Pneumonia, unspecified laterality, unspecified part of lung Is to follow up with pulmonologist on March 2.  - DG Chest 2 View; Future  3. Essential (primary) hypertension Well controlled.  Continue current medications.    4. Arteriosclerosis of coronary artery Currently asymptomatic. Continue current medications.         Mila Merry, MD  [facility]

## 2015-06-01 ENCOUNTER — Other Ambulatory Visit: Payer: Self-pay | Admitting: *Deleted

## 2015-06-01 ENCOUNTER — Encounter: Payer: Self-pay | Admitting: *Deleted

## 2015-06-01 NOTE — Patient Outreach (Addendum)
[facility]) Care Management  06/01/2015  Calvin Carpenter Nov 14, 1930 [id number]   Subjective: Telephone call to patient's home number, spoke with patient, and HIPAA verified.  Discussed [facility] Care Management services, physical location of office and [facility] staff are able to provide home visits.  Patient in agreement to hear more about [facility] services, to receive magnet and brochure.   States he is currently in the middle of something and will call RNCM back at a later time.   States he currently is receiving nursing services from [facility].   RNCM advised patient that [facility] Care Management services do not replace homecare but is a supplemental benefit.   Patient voiced understanding and states he is very busy these days.   States he will call RNCM back after he has reviewed the program information if he decides he would like to hear more about the services. Telephone call to referral source Kathrine Haddock, advised of above conversation with patient, verified HIPAA and states she will also reach to patient, advise patient to call [facility] RNCM and encouraged participation in the [facility] program.  Telephone call from Kathrine Haddock at [facility] Management states she spoke with the patient, who stated he will call [facility] Care Management RNCM on 06/02/15 for additional information regarding [facility] Care Management services. Telephone call to Damita Rhodie at [facility] Care Management, verified patient's primary insurance is [employer].     Objective: Per Epic case review: Patient had the following hospitalizations:Date of services 05/23/15 - 05/25/15 for bilateral pneumonia, atrial fibrillation, and non-ST elevated myocardial infarction.   Date of services 04/16/15 - 04/17/15.  Community acquired pneumonia.   Dates of service 08/05/14 - 08/06/14 for congestive heart failure, COPD, and respiratory failure.  Patient had ED visit on 02/11/15 for UTI and pulmonary edema.   Patient has a history of : COPD,  chronic systolic congestive heart failure, hypertension, cardiovascular disease, aortic stenosis, arteriosclerosis of coronary artery and hyperlipidemia.  Patient's last visit with specialist Clarisa Kindred FNP at [facility] heart failure clinic, was on 04/28/15.  Per [facility] of Care Recommendation report: patient has flu vaccine given on 01/29/15.    Assessment:  Received [facility] referral on 05/29/15.  Referral source: Marciano Sequin or Kathrine Haddock.   Reason for referral: Disease, symptom management, medication management, and daily weights.  Patient has not agreed to [facility] Care Management services and does not want a follow up call for outreach.  Patient will review [facility] program information and reach out to [facility] RNCM if services needed.    Plan: RNCM will send patient, successful outreach letter, [facility] program brochure and magnet. RNCM will await call back from patient on 06/02/15, per patient's request,  if no call back will proceed with case closure.   Leobardo Granlund H. Gardiner Barefoot, BSN, CCM [facility] Care Management [facility] Telephonic CM Phone: [phone] Fax: [phone]

## 2015-06-02 ENCOUNTER — Other Ambulatory Visit: Payer: Self-pay | Admitting: *Deleted

## 2015-06-02 DIAGNOSIS — I5032 Chronic diastolic (congestive) heart failure: Secondary | ICD-10-CM

## 2015-06-02 DIAGNOSIS — I5022 Chronic systolic (congestive) heart failure: Secondary | ICD-10-CM

## 2015-06-02 NOTE — Patient Outreach (Addendum)
[facility]) Care Management  06/02/2015  HYDE SIRES 1931/01/20 [id number]   Subjective:  Telephone call from patient and HIPAA verified.   Patient states he is doing well and has had a great day.   Discussed [facility] Care Management services and patient in agreement to receive services.   Patient gave verbal authorization for [facility] to speak with wife Delray Alt regarding his healthcare needs.   Spoke with wife,  RNCM able to answer questions and verify that [facility] was not a scam per patient's wife feedback.   Wife transferred [facility] back to patient to complete telephone screen.   RNCM advised patient and wife that [facility] Care Management services are free.   RNCM advised patient and wife that there is no copay for [facility] Care Management services and that [facility] is not a home health company.   Patient states he has borderline congestive heart failure and is followed by Clarisa Kindred FNP at the [facility] Heart Failure clinic.   Patient states he monitors and records his weights daily.   Patient in agreement to receiving telephonic congestive heart failure education, monitoring, and community resources.   Patient states his next follow up appointment with the pulmonologist Dr. Billy Fischer in on 06/25/15.   Patient states he is very active and will be going to play golf every Wednesday starting in March of 2017 with a group of friend.   Patient currently receiving home health through [facility].  States he has no additional home care needs at this time.  Patient voices understanding the [facility] Care Management services will not replace his current home care services.  Telephone call to Kathrine Haddock at [facility], left voicemail message advising patient has agreed to participate in the [facility] Care Management program and to call back with any questions. Spoke with [facility] Health Coach, Gean Maidens, on 06/03/15  advised of above conversations.  States she will call patient, introduce herself and  advise of follow up call next week.   Telephone call to patient's home number on 06/03/15, times 2, no answer, phone disconnected automatically and RNCM unable to leave a message.  Objective: Per Epic case review: Patient had the following hospitalizations:Date of services 05/23/15 - 05/25/15 for bilateral pneumonia, atrial fibrillation, and non-ST elevated myocardial infarction. Date of services 04/16/15 - 04/17/15. Community acquired pneumonia. Dates of service 08/05/14 - 08/06/14 for congestive heart failure, COPD, and respiratory failure. Patient had ED visit on 02/11/15 for UTI and pulmonary edema. Patient has a history of : COPD, chronic systolic congestive heart failure, hypertension, cardiovascular disease, aortic stenosis, arteriosclerosis of coronary artery and hyperlipidemia. Patient's last visit with specialist Clarisa Kindred FNP at [facility] heart failure clinic, was on 04/28/15. Per [facility] of Care Recommendation report: patient has flu vaccine given on 01/29/15.   Assessment: Received [facility] referral on 05/29/15. Referral source: Kathrine Haddock. Reason for referral: Disease, symptom management, medication management, and daily weights. Patient has agreed to [facility] Care Management services and telephonic outreach.Patient will be followed by [facility] Coach for congestive heart failure education, monitoring, and assess community resources as needed.   Patient has no telephonic RNCM or community RNCM care coordination needs at this time.    Plan: RNCM will refer patient to [facility] Health Coach for  congestive heart failure education, monitoring, and assess community resources as needed.  RNCM will ask [facility] Health Coach to ask patient if he is agreeable to a referral to [facility] for medication review and refer if agreeable.  (  Patient expressed concern regarding receiving a lot of phone calls).  Jakson Delpilar H. Gardiner Barefoot, BSN, CCM [facility] Care Management [facility] Telephonic CM Phone: [phone] Fax:  [phone]

## 2015-06-04 ENCOUNTER — Telehealth: Payer: Self-pay

## 2015-06-04 ENCOUNTER — Other Ambulatory Visit: Payer: Self-pay | Admitting: *Deleted

## 2015-06-04 NOTE — Patient Outreach (Signed)
[facility]) Care Management  06/04/2015  MCKADE GURKA 1931-02-15 [id number]  RN Health Coach telephone call to patient.  Hipaa compliance verified. RN Health Coach  introduced self to patient. Patient was very agreeable to follow up out reach calls. Patient was referred by  Elmer Picker RN.  Per patient He would like for a call on Tues Morning between 8 am and 9am to do complete assessment.  Plan: RN Health Coach will do follow up call Tues 0214/2017  for assessment. Patient agreed to follow up call.  Gean Maidens BSN RN [facility] Care Management [phone]

## 2015-06-04 NOTE — Telephone Encounter (Signed)
Hayley with [facility] called. She states we have been going back and forth with patient's Verapamil. Patient did not want to take the 24 hour capsules and we switched to to the tablets. She wanted to make sure we were aware that tablets are 12 hour and are not 24 hours and that patient takes 1 tablet and that it needs to be at 1 tablet not 2. Please review. Her direct number is [phone]

## 2015-06-05 ENCOUNTER — Telehealth: Payer: Self-pay | Admitting: Family Medicine

## 2015-06-05 NOTE — Telephone Encounter (Signed)
Left message as requested on vm.

## 2015-06-05 NOTE — Telephone Encounter (Signed)
Please review. Thanks!  

## 2015-06-05 NOTE — Telephone Encounter (Signed)
Yes, we are aware that the verapamil is labeled as a 12 hour tablet. We want him to start with one tablet at bedtime as prescribed and will be re-evaluating at next office visit.

## 2015-06-05 NOTE — Telephone Encounter (Signed)
Misty Stanley with [facility] states pt refused skilled nursing today.  ZO#[phone]

## 2015-06-09 ENCOUNTER — Encounter: Payer: Self-pay | Admitting: *Deleted

## 2015-06-09 ENCOUNTER — Other Ambulatory Visit: Payer: Self-pay | Admitting: *Deleted

## 2015-06-09 ENCOUNTER — Ambulatory Visit: Payer: Self-pay | Admitting: *Deleted

## 2015-06-09 NOTE — Patient Outreach (Signed)
[facility]) Care Management  [facility] Care Manager  06/09/2015   Calvin Carpenter Dec 19, 1930 [id number]  Subjective: RN Health Coach telephone call to patient.  Hipaa compliance verified.Per patient he is weighing daily and recording. Patient is not exercising as much since his dog died and they would go walking each day. Patient has oxygen at home but he stated that he does not need. Per patient he checks his oxygen saturation. Patient has nebulizer machine that he stated he doesn't have to use very often. Per patient when the weather is warmer he goes golfing. Per patient he has a little swelling and that is caused by the diabetic socks.  Patient stated that he follows a low sodium diet. He uses Ms Sharilyn Sites for seasoning. When asked about congestive heart failure patient stated that he doesn't actually have that he has fluid around the heart sometimes and that the lasix takes that away.   Current Medications:  Current Outpatient Prescriptions  Medication Sig Dispense Refill  . albuterol (VENTOLIN HFA) 108 (90 BASE) MCG/ACT inhaler Inhale 2 puffs into the lungs every 6 (six) hours as needed for wheezing or shortness of breath.     Marland Kitchen amLODipine (NORVASC) 5 MG tablet Take 1 tablet (5 mg total) by mouth daily. 90 tablet 4  . aspirin EC 81 MG tablet Take 81 mg by mouth every morning.    . budesonide-formoterol (SYMBICORT) 80-4.5 MCG/ACT inhaler Inhale 2 puffs into the lungs 2 (two) times daily. 1 Inhaler 12  . fluticasone (FLONASE) 50 MCG/ACT nasal spray Place 1 spray into both nostrils daily as needed for allergies or rhinitis.     . furosemide (LASIX) 20 MG tablet TAKE ONE (1) TABLET EACH DAY 90 tablet 3  . HYDROcodone-acetaminophen (NORCO/VICODIN) 5-325 MG tablet Take 1 tablet by mouth every 6 (six) hours as needed for moderate pain. 30 tablet 0  . ipratropium-albuterol (DUONEB) 0.5-2.5 (3) MG/3ML SOLN Take 3 mLs by nebulization every 6 (six) hours as needed (for shortness of  breath/wheezing.).     Marland Kitchen metoprolol succinate (TOPROL-XL) 25 MG 24 hr tablet Take 1 tablet (25 mg total) by mouth daily. 90 tablet 4  . montelukast (SINGULAIR) 10 MG tablet Take 1 tablet (10 mg total) by mouth as needed. 90 tablet 1  . quinapril (ACCUPRIL) 20 MG tablet Take 1 tablet (20 mg total) by mouth daily. 90 tablet 4  . simvastatin (ZOCOR) 40 MG tablet Take 1 tablet (40 mg total) by mouth daily. 90 tablet 4  . verapamil (CALAN-SR) 180 MG CR tablet Take 1 tablet (180 mg total) by mouth at bedtime. 90 tablet 3   No current facility-administered medications for this visit.    Functional Status:  In your present state of health, do you have any difficulty performing the following activities: 06/09/2015 06/03/2015  Hearing? N -  Vision? N -  Difficulty concentrating or making decisions? N -  Walking or climbing stairs? Y -  Dressing or bathing? N N  Doing errands, shopping? N N  Preparing Food and eating ? N -  Using the Toilet? N -  In the past six months, have you accidently leaked urine? N -  Do you have problems with loss of bowel control? N -  Managing your Medications? N -  Managing your Finances? N -  Housekeeping or managing your Housekeeping? N -    Fall/Depression Screening: PHQ 2/9 Scores 06/09/2015 04/28/2015 01/29/2015 10/24/2014 08/28/2014  PHQ - 2 Score 0 0 0 0 0  PHQ- 9 Score - - 2 - -    Assessment:  Knowledge deficit in self management of congestive heart failure Patient would benefit from Health Coach telephonic outreach for education and support for self management of congestive heart failure. Patient has agreed to follow up outreach calls [facility] CM Care Plan Problem One        Most Recent Value   Care Plan Problem One  Knowledge deficit relatere to self management of congestive heart failure   Role Documenting the Problem One  Health Coach   Care Plan for Problem One  Active   [facility] Long Term Goal (31-90 days)  Patient will not have any readmissions for congestive  heart failure within the next 90 days   [facility] Long Term Goal Start Date  06/09/15   Interventions for Problem One Long Term Goal  RN Health coach reminded patient the importance of keeping appointments with PCP and cardiologist. RN Health Coach will monitor patient monthly telephonic. RN Health Coach reminded the patient the importance of taking medication as prescribed by physician   [facility] CM Short Term Goal #1 (0-30 days)  Patient will be able to verbalize signs and symptoms of chf within the next 30 days   [facility] CM Short Term Goal #1 Start Date  06/09/15   Interventions for Short Term Goal #1  RN Health coach will send educational material on congestive heart failure zones. RN health coach discussed signs and symptoms and action plans.   [facility] CM Short Term Goal #2 (0-30 days)  Patient will weigh daily and record weight in log provided by health coach within the next 30 days   [facility] CM Short Term Goal #2 Start Date  06/09/15   Interventions for Short Term Goal #2  RN health Coach discussed the importance ofweighing daily and recording. RN discussed any weight gain of 3 or more pounds in a day or 5 pounds in one week. RN Health Coach discussed the importance of weighing the same time each day     Plan: RN Health Coach will send EMMI information on What is Congestive Heart Failure RN Health Coach will send EMMI information on  Keeping track of your weight RN Health Coach will send EMMI information on  How to be salt smart RN Health Coach will send EMMI information on  Understanding water pills and thirst RN Health Coach will send EMMI information on Working with your Doctor RN Health Coach will send EMMI information on When to call your Doctor or 911 RN Health Coach will send EMMI information on Pneumonia RN will send 2017 Calendar Book RN will send a low salt picture chart RN will will follow up outreach within a month to discuss information with teach back.   Gean Maidens, BSN, RN [facility] Care [facility] RN Health Coach Phone: [phone] [facility] complies with applicable Federal civil rights laws and does not discriminate on the basis of race, color, national origin, age, disability, or sex. Espaol (Spanish)  [facility] Customer service manager cumple con las leyes federales de derechos civiles aplicables y no discrimina por motivos de raza, color, nacionalidad, edad, discapacidad o sexo.    [place]  [facility] Art [facility] manager th? lu?t dn quy?n hi?n hnh c?a Lin bang v khng phn bi?t ?i x? d?a trn ch?ng t?c, mu da, ngu?n g?c qu?c gia, ? tu?i, khuy?t t?t, ho?c gi?i tnh.    (Arabic)    [facility] Customer service manager                      .

## 2015-06-10 ENCOUNTER — Ambulatory Visit: Payer: PPO | Admitting: *Deleted

## 2015-06-11 DIAGNOSIS — I5022 Chronic systolic (congestive) heart failure: Secondary | ICD-10-CM | POA: Diagnosis not present

## 2015-06-11 DIAGNOSIS — I10 Essential (primary) hypertension: Secondary | ICD-10-CM | POA: Diagnosis not present

## 2015-06-11 DIAGNOSIS — J449 Chronic obstructive pulmonary disease, unspecified: Secondary | ICD-10-CM | POA: Diagnosis not present

## 2015-06-11 DIAGNOSIS — I482 Chronic atrial fibrillation: Secondary | ICD-10-CM | POA: Diagnosis not present

## 2015-06-19 ENCOUNTER — Emergency Department: Payer: PPO

## 2015-06-19 ENCOUNTER — Encounter: Payer: Self-pay | Admitting: Emergency Medicine

## 2015-06-19 ENCOUNTER — Inpatient Hospital Stay
Admit: 2015-06-19 | Discharge: 2015-06-19 | Disposition: A | Discharge status: Home / Self Care | Payer: PPO | Attending: Internal Medicine | Admitting: Internal Medicine

## 2015-06-19 ENCOUNTER — Inpatient Hospital Stay: Admit: 2015-06-19 | Payer: PPO

## 2015-06-19 DIAGNOSIS — Z7952 Long term (current) use of systemic steroids: Secondary | ICD-10-CM

## 2015-06-19 DIAGNOSIS — Z66 Do not resuscitate: Secondary | ICD-10-CM | POA: Diagnosis present

## 2015-06-19 DIAGNOSIS — E871 Hypo-osmolality and hyponatremia: Secondary | ICD-10-CM | POA: Diagnosis present

## 2015-06-19 DIAGNOSIS — I5023 Acute on chronic systolic (congestive) heart failure: Secondary | ICD-10-CM | POA: Diagnosis present

## 2015-06-19 DIAGNOSIS — Z801 Family history of malignant neoplasm of trachea, bronchus and lung: Secondary | ICD-10-CM | POA: Diagnosis not present

## 2015-06-19 DIAGNOSIS — E785 Hyperlipidemia, unspecified: Secondary | ICD-10-CM | POA: Diagnosis present

## 2015-06-19 DIAGNOSIS — J810 Acute pulmonary edema: Secondary | ICD-10-CM | POA: Diagnosis not present

## 2015-06-19 DIAGNOSIS — Z79899 Other long term (current) drug therapy: Secondary | ICD-10-CM | POA: Diagnosis not present

## 2015-06-19 DIAGNOSIS — I2510 Atherosclerotic heart disease of native coronary artery without angina pectoris: Secondary | ICD-10-CM | POA: Diagnosis present

## 2015-06-19 DIAGNOSIS — I110 Hypertensive heart disease with heart failure: Secondary | ICD-10-CM | POA: Diagnosis present

## 2015-06-19 DIAGNOSIS — I454 Nonspecific intraventricular block: Secondary | ICD-10-CM | POA: Diagnosis present

## 2015-06-19 DIAGNOSIS — Z87891 Personal history of nicotine dependence: Secondary | ICD-10-CM

## 2015-06-19 DIAGNOSIS — J9600 Acute respiratory failure, unspecified whether with hypoxia or hypercapnia: Secondary | ICD-10-CM

## 2015-06-19 DIAGNOSIS — D72829 Elevated white blood cell count, unspecified: Secondary | ICD-10-CM | POA: Diagnosis present

## 2015-06-19 DIAGNOSIS — Z6824 Body mass index (BMI) 24.0-24.9, adult: Secondary | ICD-10-CM

## 2015-06-19 DIAGNOSIS — I420 Dilated cardiomyopathy: Secondary | ICD-10-CM | POA: Diagnosis present

## 2015-06-19 DIAGNOSIS — Z8041 Family history of malignant neoplasm of ovary: Secondary | ICD-10-CM

## 2015-06-19 DIAGNOSIS — N179 Acute kidney failure, unspecified: Secondary | ICD-10-CM | POA: Diagnosis present

## 2015-06-19 DIAGNOSIS — R0602 Shortness of breath: Secondary | ICD-10-CM

## 2015-06-19 DIAGNOSIS — Z833 Family history of diabetes mellitus: Secondary | ICD-10-CM | POA: Diagnosis not present

## 2015-06-19 DIAGNOSIS — Z87442 Personal history of urinary calculi: Secondary | ICD-10-CM | POA: Diagnosis not present

## 2015-06-19 DIAGNOSIS — J9620 Acute and chronic respiratory failure, unspecified whether with hypoxia or hypercapnia: Secondary | ICD-10-CM | POA: Diagnosis not present

## 2015-06-19 DIAGNOSIS — Z7951 Long term (current) use of inhaled steroids: Secondary | ICD-10-CM

## 2015-06-19 DIAGNOSIS — Z8619 Personal history of other infectious and parasitic diseases: Secondary | ICD-10-CM | POA: Diagnosis not present

## 2015-06-19 DIAGNOSIS — I350 Nonrheumatic aortic (valve) stenosis: Secondary | ICD-10-CM | POA: Diagnosis present

## 2015-06-19 DIAGNOSIS — Z95828 Presence of other vascular implants and grafts: Secondary | ICD-10-CM | POA: Diagnosis not present

## 2015-06-19 DIAGNOSIS — I255 Ischemic cardiomyopathy: Secondary | ICD-10-CM | POA: Diagnosis present

## 2015-06-19 DIAGNOSIS — I509 Heart failure, unspecified: Secondary | ICD-10-CM

## 2015-06-19 DIAGNOSIS — I959 Hypotension, unspecified: Secondary | ICD-10-CM | POA: Diagnosis present

## 2015-06-19 DIAGNOSIS — I10 Essential (primary) hypertension: Secondary | ICD-10-CM | POA: Diagnosis present

## 2015-06-19 DIAGNOSIS — Z515 Encounter for palliative care: Secondary | ICD-10-CM | POA: Diagnosis not present

## 2015-06-19 DIAGNOSIS — I214 Non-ST elevation (NSTEMI) myocardial infarction: Principal | ICD-10-CM | POA: Diagnosis present

## 2015-06-19 DIAGNOSIS — Z7189 Other specified counseling: Secondary | ICD-10-CM | POA: Diagnosis not present

## 2015-06-19 DIAGNOSIS — Z951 Presence of aortocoronary bypass graft: Secondary | ICD-10-CM

## 2015-06-19 DIAGNOSIS — I480 Paroxysmal atrial fibrillation: Secondary | ICD-10-CM | POA: Diagnosis present

## 2015-06-19 DIAGNOSIS — J441 Chronic obstructive pulmonary disease with (acute) exacerbation: Secondary | ICD-10-CM | POA: Diagnosis present

## 2015-06-19 DIAGNOSIS — I252 Old myocardial infarction: Secondary | ICD-10-CM

## 2015-06-19 DIAGNOSIS — I739 Peripheral vascular disease, unspecified: Secondary | ICD-10-CM | POA: Diagnosis present

## 2015-06-19 DIAGNOSIS — J9601 Acute respiratory failure with hypoxia: Secondary | ICD-10-CM | POA: Diagnosis present

## 2015-06-19 DIAGNOSIS — Z7982 Long term (current) use of aspirin: Secondary | ICD-10-CM | POA: Diagnosis not present

## 2015-06-19 LAB — COMPREHENSIVE METABOLIC PANEL
ALK PHOS: 45 U/L (ref 38–126)
ALT: 18 U/L (ref 17–63)
ANION GAP: 14 (ref 5–15)
AST: 43 U/L — ABNORMAL HIGH (ref 15–41)
Albumin: 4 g/dL (ref 3.5–5.0)
BILIRUBIN TOTAL: 0.5 mg/dL (ref 0.3–1.2)
BUN: 13 mg/dL (ref 6–20)
CALCIUM: 9 mg/dL (ref 8.9–10.3)
CO2: 22 mmol/L (ref 22–32)
Chloride: 102 mmol/L (ref 101–111)
Creatinine, Ser: 1.14 mg/dL (ref 0.61–1.24)
GFR, EST NON AFRICAN AMERICAN: 58 mL/min — AB (ref >60)
GLUCOSE: 201 mg/dL — AB (ref 65–99)
Potassium: 4.1 mmol/L (ref 3.5–5.1)
Sodium: 138 mmol/L (ref 135–145)
TOTAL PROTEIN: 7.3 g/dL (ref 6.5–8.1)

## 2015-06-19 LAB — CBC WITH DIFFERENTIAL/PLATELET
Basophils Absolute: 0.1 10*3/uL (ref 0–0.1)
Basophils Relative: 1 %
Eosinophils Absolute: 0.1 10*3/uL (ref 0–0.7)
Eosinophils Relative: 1 %
HEMATOCRIT: 33.9 % — AB (ref 40.0–52.0)
HEMOGLOBIN: 11.1 g/dL — AB (ref 13.0–18.0)
LYMPHS ABS: 2.5 10*3/uL (ref 1.0–3.6)
Lymphocytes Relative: 16 %
MCH: 31.1 pg (ref 26.0–34.0)
MCHC: 32.8 g/dL (ref 32.0–36.0)
MCV: 94.9 fL (ref 80.0–100.0)
MONOS PCT: 10 %
Monocytes Absolute: 1.5 10*3/uL — ABNORMAL HIGH (ref 0.2–1.0)
Neutro Abs: 11.9 10*3/uL — ABNORMAL HIGH (ref 1.4–6.5)
Neutrophils Relative %: 74 %
Platelets: 310 10*3/uL (ref 150–440)
RBC: 3.58 MIL/uL — ABNORMAL LOW (ref 4.40–5.90)
RDW: 15 % — ABNORMAL HIGH (ref 11.5–14.5)
WBC: 16.1 10*3/uL — AB (ref 3.8–10.6)

## 2015-06-19 LAB — URINALYSIS COMPLETE WITH MICROSCOPIC (ARMC ONLY)
BILIRUBIN URINE: NEGATIVE
Bacteria, UA: NONE SEEN
GLUCOSE, UA: NEGATIVE mg/dL
KETONES UR: NEGATIVE mg/dL
NITRITE: NEGATIVE
Protein, ur: NEGATIVE mg/dL
Specific Gravity, Urine: 1.011 (ref 1.005–1.030)
pH: 5 (ref 5.0–8.0)

## 2015-06-19 LAB — HEPARIN LEVEL (UNFRACTIONATED): Heparin Unfractionated: 0.24 IU/mL — ABNORMAL LOW (ref 0.30–0.70)

## 2015-06-19 LAB — TROPONIN I
Troponin I: 1.36 ng/mL — ABNORMAL HIGH (ref <0.031)
Troponin I: 1.94 ng/mL — ABNORMAL HIGH (ref <0.031)
Troponin I: 3.3 ng/mL — ABNORMAL HIGH

## 2015-06-19 LAB — PROCALCITONIN: Procalcitonin: <0.1 ng/mL

## 2015-06-19 LAB — APTT: APTT: 27 s (ref 24–36)

## 2015-06-19 LAB — TSH: TSH: 2.128 u[IU]/mL (ref 0.350–4.500)

## 2015-06-19 LAB — LACTIC ACID, PLASMA
LACTIC ACID, VENOUS: 6.6 mmol/L — AB (ref 0.5–2.0)
LACTIC ACID, VENOUS: 7.8 mmol/L — AB (ref 0.5–2.0)

## 2015-06-19 LAB — MRSA PCR SCREENING: MRSA by PCR: NEGATIVE

## 2015-06-19 LAB — BRAIN NATRIURETIC PEPTIDE: B Natriuretic Peptide: 1357 pg/mL — ABNORMAL HIGH (ref 0.0–100.0)

## 2015-06-19 LAB — PROTIME-INR
INR: 1.15
Prothrombin Time: 14.9 seconds (ref 11.4–15.0)

## 2015-06-19 MED ORDER — MOMETASONE FURO-FORMOTEROL FUM 100-5 MCG/ACT IN AERO
2.0000 | INHALATION_SPRAY | Freq: Two times a day (BID) | RESPIRATORY_TRACT | Status: DC
  Administered 2015-06-19 – 2015-06-21 (×5): 2 via RESPIRATORY_TRACT
  Filled 2015-06-19: qty 8.8

## 2015-06-19 MED ORDER — HEPARIN BOLUS VIA INFUSION
3500.0000 [IU] | Freq: Once | INTRAVENOUS | Status: DC
  Filled 2015-06-19: qty 3500

## 2015-06-19 MED ORDER — FLUTICASONE PROPIONATE 50 MCG/ACT NA SUSP: 1.0000 | Freq: Every day | NASAL | Status: DC | PRN

## 2015-06-19 MED ORDER — FUROSEMIDE 10 MG/ML IJ SOLN
40.0000 mg | Freq: Two times a day (BID) | INTRAMUSCULAR | Status: DC
  Administered 2015-06-20: 40 mg via INTRAVENOUS
  Filled 2015-06-19: qty 4

## 2015-06-19 MED ORDER — ONDANSETRON HCL 4 MG/2ML IJ SOLN
4.0000 mg | Freq: Four times a day (QID) | INTRAMUSCULAR | Status: DC | PRN
  Administered 2015-06-19: 4 mg via INTRAVENOUS
  Filled 2015-06-19: qty 2

## 2015-06-19 MED ORDER — LEVOFLOXACIN IN D5W 750 MG/150ML IV SOLN
750.0000 mg | Freq: Once | INTRAVENOUS | Status: AC
  Administered 2015-06-19: 750 mg via INTRAVENOUS
  Filled 2015-06-19: qty 150

## 2015-06-19 MED ORDER — ONDANSETRON HCL 4 MG PO TABS: 4.0000 mg | ORAL_TABLET | Freq: Four times a day (QID) | ORAL | Status: DC | PRN

## 2015-06-19 MED ORDER — ALPRAZOLAM 0.25 MG PO TABS
0.2500 mg | ORAL_TABLET | Freq: Four times a day (QID) | ORAL | Status: DC | PRN
Start: 2015-06-19 — End: 2015-06-22
  Administered 2015-06-19 – 2015-06-22 (×6): 0.25 mg via ORAL
  Filled 2015-06-19 (×6): qty 1

## 2015-06-19 MED ORDER — HEPARIN (PORCINE) IN NACL 100-0.45 UNIT/ML-% IJ SOLN
1100.0000 [IU]/h | INTRAMUSCULAR | Status: DC
  Administered 2015-06-19: 850 [IU]/h via INTRAVENOUS
  Administered 2015-06-20 – 2015-06-21 (×2): 1100 [IU]/h via INTRAVENOUS
  Filled 2015-06-19 (×5): qty 250

## 2015-06-19 MED ORDER — SIMVASTATIN 40 MG PO TABS
40.0000 mg | ORAL_TABLET | Freq: Every day | ORAL | Status: DC
  Administered 2015-06-19 – 2015-06-22 (×4): 40 mg via ORAL
  Filled 2015-06-19 (×4): qty 1

## 2015-06-19 MED ORDER — OXYCODONE HCL 5 MG PO TABS: 5.0000 mg | ORAL_TABLET | ORAL | Status: DC | PRN

## 2015-06-19 MED ORDER — SODIUM CHLORIDE 0.9 % IV SOLN
1000.0000 mL | Freq: Once | INTRAVENOUS | Status: AC
  Administered 2015-06-19: 1000 mL via INTRAVENOUS

## 2015-06-19 MED ORDER — IPRATROPIUM-ALBUTEROL 0.5-2.5 (3) MG/3ML IN SOLN
3.0000 mL | RESPIRATORY_TRACT | Status: DC | PRN
  Administered 2015-06-20: 3 mL via RESPIRATORY_TRACT
  Filled 2015-06-19: qty 3

## 2015-06-19 MED ORDER — MORPHINE SULFATE (PF) 2 MG/ML IV SOLN
2.0000 mg | INTRAVENOUS | Status: DC | PRN
Start: 2015-06-19 — End: 2015-06-21

## 2015-06-19 MED ORDER — LISINOPRIL 20 MG PO TABS
20.0000 mg | ORAL_TABLET | Freq: Every day | ORAL | Status: DC
  Filled 2015-06-19 (×2): qty 1

## 2015-06-19 MED ORDER — ASPIRIN 81 MG PO CHEW
324.0000 mg | CHEWABLE_TABLET | Freq: Once | ORAL | Status: AC
  Administered 2015-06-19: 324 mg via ORAL
  Filled 2015-06-19: qty 4

## 2015-06-19 MED ORDER — HEPARIN BOLUS VIA INFUSION
1050.0000 [IU] | Freq: Once | INTRAVENOUS | Status: AC
  Administered 2015-06-19: 1050 [IU] via INTRAVENOUS
  Filled 2015-06-19: qty 1050

## 2015-06-19 MED ORDER — HEPARIN SODIUM (PORCINE) 5000 UNIT/ML IJ SOLN
INTRAMUSCULAR | Status: AC
  Administered 2015-06-19: 3500 [IU] via INTRAVENOUS
  Filled 2015-06-19: qty 1

## 2015-06-19 MED ORDER — ACETAMINOPHEN 325 MG PO TABS: 650.0000 mg | ORAL_TABLET | Freq: Four times a day (QID) | ORAL | Status: DC | PRN

## 2015-06-19 MED ORDER — METOPROLOL SUCCINATE ER 25 MG PO TB24
12.5000 mg | ORAL_TABLET | Freq: Once | ORAL | Status: AC
  Administered 2015-06-19: 12.5 mg via ORAL

## 2015-06-19 MED ORDER — METOPROLOL SUCCINATE ER 25 MG PO TB24
12.5000 mg | ORAL_TABLET | Freq: Once | ORAL | Status: AC
  Administered 2015-06-20: 12.5 mg via ORAL

## 2015-06-19 MED ORDER — QUINAPRIL HCL 10 MG PO TABS: 20.0000 mg | ORAL_TABLET | Freq: Every day | ORAL | Status: DC

## 2015-06-19 MED ORDER — METOPROLOL SUCCINATE ER 25 MG PO TB24
25.0000 mg | ORAL_TABLET | Freq: Every day | ORAL | Status: DC
  Filled 2015-06-19 (×3): qty 1

## 2015-06-19 MED ORDER — MONTELUKAST SODIUM 10 MG PO TABS
10.0000 mg | ORAL_TABLET | Freq: Every day | ORAL | Status: DC
  Administered 2015-06-19 – 2015-06-20 (×2): 10 mg via ORAL
  Filled 2015-06-19 (×2): qty 1

## 2015-06-19 MED ORDER — VERAPAMIL HCL ER 180 MG PO TBCR
180.0000 mg | EXTENDED_RELEASE_TABLET | Freq: Every day | ORAL | Status: DC
  Administered 2015-06-19 – 2015-06-21 (×3): 180 mg via ORAL
  Filled 2015-06-19 (×4): qty 1

## 2015-06-19 MED ORDER — FUROSEMIDE 10 MG/ML IJ SOLN
40.0000 mg | Freq: Once | INTRAMUSCULAR | Status: AC
  Administered 2015-06-19: 40 mg via INTRAVENOUS
  Filled 2015-06-19: qty 4

## 2015-06-19 MED ORDER — MELOXICAM 7.5 MG PO TABS
15.0000 mg | ORAL_TABLET | Freq: Every day | ORAL | Status: DC
  Administered 2015-06-20 – 2015-06-21 (×2): 15 mg via ORAL
  Filled 2015-06-19 (×3): qty 2

## 2015-06-19 MED ORDER — ACETAMINOPHEN 650 MG RE SUPP: 650.0000 mg | Freq: Four times a day (QID) | RECTAL | Status: DC | PRN

## 2015-06-19 MED ORDER — SODIUM CHLORIDE 0.9% FLUSH
3.0000 mL | Freq: Two times a day (BID) | INTRAVENOUS | Status: DC
  Administered 2015-06-19 – 2015-06-22 (×5): 3 mL via INTRAVENOUS

## 2015-06-19 MED ORDER — ASPIRIN EC 81 MG PO TBEC
81.0000 mg | DELAYED_RELEASE_TABLET | ORAL | Status: DC
  Administered 2015-06-19 – 2015-06-22 (×4): 81 mg via ORAL
  Filled 2015-06-19 (×4): qty 1

## 2015-06-19 NOTE — ED Notes (Signed)
Brought in via ems from home with increased SOB this am   resp labored on arrival  And some tightness across chest

## 2015-06-19 NOTE — Plan of Care (Signed)
Problem: Phase I Progression Outcomes Goal: Hemodynamically stable Outcome: Progressing Systolic bp 90-110.  Hr 97-120. Sinus tach.  Few PVC's.  Goal: Anginal pain relieved Outcome: Progressing No chestpain presently. Feels best with BIPAP on Goal: Aspirin unless contraindicated Outcome: Progressing Heparin gtt at 850u/hr.  Goal: MD aware of Cardiac Marker results Outcome: Progressing Dr Gwen Pounds in to see at 1330 and aware of elevated troponins. EICU MD also aware. Goal: Voiding-avoid urinary catheter unless indicated Outcome: Not Progressing Must stand to void.  Some urinary retention per bladderscan.  In and out cath done

## 2015-06-19 NOTE — Consult Note (Signed)
[facility] Cardiology Consultation Note  Patient ID: Calvin Carpenter, MRN: [id number], DOB/AGE: 1931-03-29 82 y.o. Admit date: 05/30/2015   Date of Consult: 06/22/2015 Primary Physician: Mila Merry, MD Primary Cardiologist: Jennette Dubin  Chief Complaint:  Chief Complaint  Patient presents with  . Shortness of Breath   Reason for Consult: acute non-ST elevation myocardial infarction  HPI: 82 y.o. male with known apparent three-vessel coronary artery disease by cardiac catheterization several years prior with a previous occluded his circumflex and left anterior descending artery for which the patient has had mild LV systolic dysfunction. The patient also has history of atrial fibrillation nonvalvular but currently in normal sinus rhythm with intraventricular conduction defect and left ventricular hypertrophy as well as moderate to severe aortic valve stenosis. Recently the patient has been in the hospital multiple times over the last moment months and this is occurred causing LV systolic dysfunction and non-ST elevation myocardial infarction. At this time the patient is severely short of breath and has progression of chest discomfort with some tachycardia. This is consistent with myocardial ischemia as well as aortic valve stenosis. The patient is hypoxic as this time as well with some congestive heart failure symptoms  Past Medical History  Diagnosis Date  . Myocardial infarct [facility]) 04-23-13    CAD on cardiac cath, no PCI or CABG  . History of chicken pox   . History of measles   . History of mumps   . CHF (congestive heart failure) (HCC)     systolic dysfunction, EF 40-45%  . C. difficile colitis 12/02/2014    following prolonged antibiotic treatment for post op pneumonia   . AAA (abdominal aortic aneurysm) (HCC)   . Carotid stenosis   . HTN (hypertension)   . Hyperlipidemia   . Severe aortic stenosis   . COPD (chronic obstructive pulmonary disease) (HCC)   . Atrial fibrillation  [facility])       Surgical History:  Past Surgical History  Procedure Laterality Date  . Groin surgery  2011  . Carotid endarterectomy Right 2007    Sankar  . Vascular surgery Bilateral     Aortic aneurysm stent, right femoral-left femoral bypass  . Hernia repair Left 10-29-12 and 04-23-13    inguinal  . Femoral bypass  09/15/2010    Fem-Fem Bypass; Dr. Earnestine Leys, [facility]. Bilateral with femoral endartectomies. Excised graft June 2012 dur to infection  . Abdominal aortic aneurysm repair  2009  . Cataract extraction      Right eye 2001; Left eye 2000  . Prostate surgery  1993  . Kidney stone extraction  1980  . Cardiac catheterization  10/27/2005    Severe 2 vessel CAD with occluded prox RCA with collaterals. Lx occlusion. 95% OMI, 30% Mid LAD. Mild aortic stenosis  . Doppler echocardiography  09/08/2011    Mild global LV dysfunction EF=45%, MILD lvh, mild MI, TI. Severe AS     Home Meds: Prior to Admission medications   Medication Sig Start Date End Date Taking? Authorizing Provider  albuterol (VENTOLIN HFA) 108 (90 BASE) MCG/ACT inhaler Inhale 2 puffs into the lungs every 6 (six) hours as needed for wheezing or shortness of breath.  09/04/14  Yes Historical Provider, MD  amLODipine (NORVASC) 5 MG tablet Take 1 tablet (5 mg total) by mouth daily. 05/05/15  Yes Malva Limes, MD  aspirin EC 81 MG tablet Take 81 mg by mouth every morning.   Yes Historical Provider, MD  budesonide-formoterol (SYMBICORT) 80-4.5 MCG/ACT inhaler Inhale 2  puffs into the lungs 2 (two) times daily. 05/25/15  Yes Houston Siren, MD  fluticasone (FLONASE) 50 MCG/ACT nasal spray Place 1 spray into both nostrils daily as needed for allergies or rhinitis.    Yes Historical Provider, MD  furosemide (LASIX) 20 MG tablet TAKE ONE (1) TABLET EACH DAY 03/24/15  Yes Malva Limes, MD  HYDROcodone-acetaminophen (NORCO/VICODIN) 5-325 MG tablet Take 1 tablet by mouth every 6 (six) hours as needed for moderate pain. 04/10/15  Yes  Malva Limes, MD  ipratropium-albuterol (DUONEB) 0.5-2.5 (3) MG/3ML SOLN Take 3 mLs by nebulization every 6 (six) hours as needed (for shortness of breath/wheezing.).    Yes Historical Provider, MD  meloxicam (MOBIC) 15 MG tablet Take 1 tablet by mouth daily. 05/20/15  Yes Historical Provider, MD  metoprolol succinate (TOPROL-XL) 25 MG 24 hr tablet Take 1 tablet (25 mg total) by mouth daily. 05/05/15  Yes Malva Limes, MD  montelukast (SINGULAIR) 10 MG tablet Take 1 tablet (10 mg total) by mouth as needed. 12/02/14  Yes Malva Limes, MD  quinapril (ACCUPRIL) 20 MG tablet Take 1 tablet (20 mg total) by mouth daily. 05/05/15  Yes Malva Limes, MD  simvastatin (ZOCOR) 40 MG tablet Take 1 tablet (40 mg total) by mouth daily. 05/05/15  Yes Malva Limes, MD  verapamil (CALAN-SR) 180 MG CR tablet Take 1 tablet (180 mg total) by mouth at bedtime. 05/29/15  Yes Malva Limes, MD    Inpatient Medications:  . aspirin EC  81 mg Oral [id number]  . furosemide  40 mg Intravenous BID  . heparin  3,500 Units Intravenous Once  . lisinopril  20 mg Oral Daily  . meloxicam  15 mg Oral Daily  . metoprolol succinate  12.5 mg Oral Once  . metoprolol succinate  25 mg Oral Daily  . mometasone-formoterol  2 puff Inhalation BID  . montelukast  10 mg Oral QHS  . simvastatin  40 mg Oral Daily  . sodium chloride flush  3 mL Intravenous Q12H  . verapamil  180 mg Oral QHS   . heparin 850 Units/hr (06/04/2015 1200)    Allergies:  Allergies  Allergen Reactions  . Benazepril Hcl Rash  . Fluvastatin Sodium Rash  . Mevacor [Lovastatin] Rash    Social History   Social History  . Marital Status: Married    Spouse Name: N/A  . Number of Children: N/A  . Years of Education: N/A   Occupational History  . Retired    Social History Main Topics  . Smoking status: Former Smoker -- 1.00 packs/day for 65 years    Types: Cigarettes    Quit date: 04/26/2011  . Smokeless tobacco: Never Used     Comment: Quit 2  years ago  . Alcohol Use: No  . Drug Use: No  . Sexual Activity: Not on file   Other Topics Concern  . Not on file   Social History Narrative   Lives at home with wife, Independent at baseline. Drives still.     Family History  Problem Relation Age of Onset  . Diabetes Brother   . Diabetes Mother   . Ovarian cancer Mother   . Lung cancer Father      Review of Systems Positive for shortness of breath chest pain Negative for: General:  chills, fever, night sweats or weight changes.  Cardiovascular: PND orthopnea syncope dizziness  Dermatological skin lesions rashes Respiratory: Cough congestion Urologic: Frequent urination urination at night and hematuria  Abdominal: negative for nausea, vomiting, diarrhea, bright red blood per rectum, melena, or hematemesis Neurologic: negative for visual changes, and/or hearing changes  All other systems reviewed and are otherwise negative except as noted above.  Labs:  Recent Labs  06/03/2015 0757 06/06/2015 1213  TROPONINI 1.36* 1.94*   Lab Results  Component Value Date   WBC 16.1* 06/12/2015   HGB 11.1* 05/29/2015   HCT 33.9* 05/27/2015   MCV 94.9 06/10/2015   PLT 310 06/18/2015    Recent Labs Lab 06/21/2015 0757  NA 138  K 4.1  CL 102  CO2 22  BUN 13  CREATININE 1.14  CALCIUM 9.0  PROT 7.3  BILITOT 0.5  ALKPHOS 45  ALT 18  AST 43*  GLUCOSE 201*   Lab Results  Component Value Date   CHOL 155 01/29/2015   HDL 50 01/29/2015   LDLCALC 69 01/29/2015   TRIG 178* 01/29/2015   No results found for: DDIMER  Radiology/Studies:  Dg Chest 2 View  05/29/2015  CLINICAL DATA:  Follow-up of pneumonia ; history of COPD and cardiomyopathy and previous tobacco use. EXAM: CHEST  2 VIEW COMPARISON:  CT scan of the chest and chest x-ray of May 23, 2015 FINDINGS: The lungs are mildly hyperinflated. A trace of pleural fluid persists on the left. The bibasilar infiltrates have resolved. The heart and pulmonary vascularity are  normal. The mediastinum is normal in width. There is stable tortuosity of the descending thoracic aorta. The bony thorax exhibits no acute abnormality. IMPRESSION: COPD. Interval clearing of the bibasilar infiltrates. A trace of pleural fluid persists on the left. Electronically Signed   By: David  [place] M.D.   On: 05/29/2015 11:33   Ct Angio Chest Pe W/cm &/or Wo Cm  05/23/2015  CLINICAL DATA:  Difficulty breathing. EXAM: CT ANGIOGRAPHY CHEST WITH CONTRAST TECHNIQUE: Multidetector CT imaging of the chest was performed using the standard protocol during bolus administration of intravenous contrast. Multiplanar CT image reconstructions and MIPs were obtained to evaluate the vascular anatomy. CONTRAST:  75mL OMNIPAQUE IOHEXOL 350 MG/ML SOLN COMPARISON:  04/25/2013. FINDINGS: Mediastinum / Lymph Nodes: There is no axillary lymphadenopathy. Scattered small lymph nodes in the mediastinum do not meet CT criteria for pathologic enlargement. No evidence for mediastinal lymphadenopathy. The heart is upper normal for size. No pericardial effusion. The esophagus has normal imaging features. No filling defect within the opacified pulmonary arteries to suggest the presence of an acute pulmonary embolus. No evidence for dissection flap in the thoracic aorta. Lungs / Pleura: Moderate changes of centrilobular emphysema noted bilaterally. Bronchial wall thickening is seen diffusely in both lungs. There is bilateral dependent collapse/ consolidation in the lower lobes. Small bilateral pleural effusions are noted. Upper Abdomen:  Unremarkable. MSK / Soft Tissues: Bone windows reveal no worrisome lytic or sclerotic osseous lesions. Review of the MIP images confirms the above findings. IMPRESSION: 1. No CT evidence for acute pulmonary embolus. 2. Bilateral lower lobe collapse/consolidation with small bilateral pleural effusions. 3. Moderate changes of centrilobular emphysema. Electronically Signed   By: Kennith Center M.D.   On:  05/23/2015 18:46   Dg Chest Portable 1 View  06/05/2015  CLINICAL DATA:  Shortness of breath with cough and chest pain EXAM: PORTABLE CHEST 1 VIEW COMPARISON:  May 29, 2015 FINDINGS: Underlying changes of COPD again noted. There is currently interstitial edema. Heart is mildly enlarged. There is mild pulmonary venous hypertension. No airspace consolidation. No adenopathy. There is degenerative change in each shoulder. IMPRESSION: Findings which peer  consistent with congestive heart failure superimposed on COPD. No airspace consolidation. Electronically Signed   By: Bretta Bang III M.D.   On: 07/04/15 08:07   Dg Chest Portable 1 View  05/23/2015  CLINICAL DATA:  Shortness of Breath EXAM: PORTABLE CHEST 1 VIEW COMPARISON:  04/22/2015 FINDINGS: Cardiac shadow is at the upper limits of normal in size. Aortic calcifications are noted. Bilateral basilar infiltrates are noted. No sizable effusion is seen. No bony abnormality is noted. IMPRESSION: Bibasilar infiltrates. Electronically Signed   By: Alcide Clever M.D.   On: 05/23/2015 10:59    EKG: Sinus tachycardia with intraventricular conduction defect with left ventricular hypertrophy  Weights: Filed Weights   07/04/15 0731 July 04, 2015 1130  Weight: 155 lb (70.308 kg) 157 lb 6.5 oz (71.4 kg)     Physical Exam: Blood pressure 97/67, pulse 99, temperature 97.4 F (36.3 C), temperature source Oral, resp. rate 13, height  (1.702 m), weight 157 lb 6.5 oz (71.4 kg), SpO2 98 %. Body mass index is 24.65 kg/(m^2). General: Well developed, well nourished, in no acute distress. Head eyes ears nose throat: Normocephalic, atraumatic, sclera non-icteric, no xanthomas, nares are without discharge. No apparent thyromegaly and/or mass  Lungs: Increased respiratory effort.  no wheezes, basilar rales, no rhonchi.  Heart: RRR with normal S1 soft S2. 3+ aortic murmur gallop, no rub, PMI is normal size and placement, carotid upstroke normal with  bruit,  jugular venous pressure is normal Abdomen: Soft, non-tender, non-distended with normoactive bowel sounds. No hepatomegaly. No rebound/guarding. No obvious abdominal masses. Abdominal aorta is normal size without bruit Extremities: Trace to 1+ edema. no cyanosis, no clubbing, no ulcers  Peripheral : 2+ bilateral upper extremity pulses, 2+ bilateral femoral pulses, 2+ bilateral dorsal pedal pulse Neuro: Alert and oriented. No facial asymmetry. No focal deficit. Moves all extremities spontaneously. Musculoskeletal: Normal muscle tone without kyphosis Psych:  Responds to questions appropriately with a normal affect.    Assessment: 82 year old male with ischemic cardiomyopathy with non-ST elevation myocardial infarction and acute on chronic congestive heart failure with pulmonary edema multifactorial in nature including infarct ischemia and aortic stenosis  Plan: 1. Continue intravenous Lasix for pulmonary edema and congestive heart failure 2. Metoprolol for heart rate control and abstain from ACE inhibitor at this time due to hypotension 3. Heparin for acute non-ST elevation myocardial infarction 4. Repeat echocardiogram for LV systolic dysfunction extent of the valvular heart disease contributing to above 5. Further consideration of cardiac catheterization for further treatment options listed above the patient does not improve with medical management. Patient understands the risk and benefits of cardiac catheterization. This includes a possibility of death stroke heart attack infection bleeding or blood clot. He understands this days has a low risk of conscious sedation  Signed, Lamar Blinks M.D. [facility] Cardiology Jul 04, 2015, 3:33 PM

## 2015-06-19 NOTE — H&P (Signed)
[facility] - [place] at [facility]   PATIENT NAME: Calvin Carpenter    MR#:  [id number]  DATE OF BIRTH:  Jun 03, 1930   DATE OF ADMISSION:  07/16/2015  PRIMARY CARE PHYSICIAN: Mila Merry, MD   REQUESTING/REFERRING PHYSICIAN: Inocencio Homes  CHIEF COMPLAINT:   Chief Complaint  Patient presents with  . Shortness of Breath    HISTORY OF PRESENT ILLNESS:  Pilot Carpenter  is a 82 y.o. male with a known history of congestive heart failure systolic presenting with shortness of breath. Patient states approximate 2 day duration of shortness of breath originally dictated on exertion now continuous denies associated fevers, chills has had intermittent chest pain retrosternal in location 5/10 intensity for about 3 days in total duration not associated with activity no worsening or relieving factors. On presentation to hospital and noted to be in respiratory distress requiring BiPAP therapy. Since that time has improved on BiPAP  PAST MEDICAL HISTORY:   Past Medical History  Diagnosis Date  . Myocardial infarct [facility]) 04-23-13    CAD on cardiac cath, no PCI or CABG  . History of chicken pox   . History of measles   . History of mumps   . CHF (congestive heart failure) (HCC)     systolic dysfunction, EF 40-45%  . C. difficile colitis 12/02/2014    following prolonged antibiotic treatment for post op pneumonia   . AAA (abdominal aortic aneurysm) (HCC)   . Carotid stenosis   . HTN (hypertension)   . Hyperlipidemia   . Severe aortic stenosis   . COPD (chronic obstructive pulmonary disease) (HCC)   . Atrial fibrillation (HCC)     PAST SURGICAL HISTORY:   Past Surgical History  Procedure Laterality Date  . Groin surgery  2011  . Carotid endarterectomy Right 2007    Sankar  . Vascular surgery Bilateral     Aortic aneurysm stent, right femoral-left femoral bypass  . Hernia repair Left 10-29-12 and 04-23-13    inguinal  . Femoral bypass  09/15/2010    Fem-Fem Bypass; Dr.  Earnestine Leys, [facility]. Bilateral with femoral endartectomies. Excised graft June 2012 dur to infection  . Abdominal aortic aneurysm repair  2009  . Cataract extraction      Right eye 2001; Left eye 2000  . Prostate surgery  1993  . Kidney stone extraction  1980  . Cardiac catheterization  10/27/2005    Severe 2 vessel CAD with occluded prox RCA with collaterals. Lx occlusion. 95% OMI, 30% Mid LAD. Mild aortic stenosis  . Doppler echocardiography  09/08/2011    Mild global LV dysfunction EF=45%, MILD lvh, mild MI, TI. Severe AS    SOCIAL HISTORY:   Social History  Substance Use Topics  . Smoking status: Former Smoker -- 1.00 packs/day for 65 years    Types: Cigarettes    Quit date: 04/26/2011  . Smokeless tobacco: Never Used     Comment: Quit 2 years ago  . Alcohol Use: No    FAMILY HISTORY:   Family History  Problem Relation Age of Onset  . Diabetes Brother   . Diabetes Mother   . Ovarian cancer Mother   . Lung cancer Father     DRUG ALLERGIES:   Allergies  Allergen Reactions  . Benazepril Hcl Rash  . Fluvastatin Sodium Rash  . Mevacor [Lovastatin] Rash    REVIEW OF SYSTEMS:  REVIEW OF SYSTEMS:  CONSTITUTIONAL: Denies fevers, chills, fatigue, weakness.  EYES: Denies blurred vision, double vision, or eye  pain.  EARS, NOSE, THROAT: Denies tinnitus, ear pain, hearing loss.  RESPIRATORY: denies cough, positive shortness of breath, denies wheezing  CARDIOVASCULAR: Positive chest pain, denies palpitations, edema.  GASTROINTESTINAL: Denies nausea, vomiting, diarrhea, abdominal pain.  GENITOURINARY: Denies dysuria, hematuria.  ENDOCRINE: Denies nocturia or thyroid problems. HEMATOLOGIC AND LYMPHATIC: Denies easy bruising or bleeding.  SKIN: Denies rash or lesions.  MUSCULOSKELETAL: Denies pain in neck, back, shoulder, knees, hips, or further arthritic symptoms.  NEUROLOGIC: Denies paralysis, paresthesias.  PSYCHIATRIC: Denies anxiety or depressive symptoms. Otherwise full  review of systems performed by me is negative.   MEDICATIONS AT HOME:   Prior to Admission medications   Medication Sig Start Date End Date Taking? Authorizing Provider  albuterol (VENTOLIN HFA) 108 (90 BASE) MCG/ACT inhaler Inhale 2 puffs into the lungs every 6 (six) hours as needed for wheezing or shortness of breath.  09/04/14   Historical Provider, MD  amLODipine (NORVASC) 5 MG tablet Take 1 tablet (5 mg total) by mouth daily. 05/05/15   Malva Limes, MD  aspirin EC 81 MG tablet Take 81 mg by mouth every morning.    Historical Provider, MD  budesonide-formoterol (SYMBICORT) 80-4.5 MCG/ACT inhaler Inhale 2 puffs into the lungs 2 (two) times daily. 05/25/15   Houston Siren, MD  fluticasone (FLONASE) 50 MCG/ACT nasal spray Place 1 spray into both nostrils daily as needed for allergies or rhinitis.     Historical Provider, MD  furosemide (LASIX) 20 MG tablet TAKE ONE (1) TABLET EACH DAY 03/24/15   Malva Limes, MD  HYDROcodone-acetaminophen (NORCO/VICODIN) 5-325 MG tablet Take 1 tablet by mouth every 6 (six) hours as needed for moderate pain. 04/10/15   Malva Limes, MD  ipratropium-albuterol (DUONEB) 0.5-2.5 (3) MG/3ML SOLN Take 3 mLs by nebulization every 6 (six) hours as needed (for shortness of breath/wheezing.).     Historical Provider, MD  meloxicam (MOBIC) 15 MG tablet Take 1 tablet by mouth daily. 05/20/15   Historical Provider, MD  metoprolol succinate (TOPROL-XL) 25 MG 24 hr tablet Take 1 tablet (25 mg total) by mouth daily. 05/05/15   Malva Limes, MD  montelukast (SINGULAIR) 10 MG tablet Take 1 tablet (10 mg total) by mouth as needed. 12/02/14   Malva Limes, MD  quinapril (ACCUPRIL) 20 MG tablet Take 1 tablet (20 mg total) by mouth daily. 05/05/15   Malva Limes, MD  simvastatin (ZOCOR) 40 MG tablet Take 1 tablet (40 mg total) by mouth daily. 05/05/15   Malva Limes, MD  verapamil (CALAN-SR) 180 MG CR tablet Take 1 tablet (180 mg total) by mouth at bedtime. 05/29/15    Malva Limes, MD      VITAL SIGNS:  Blood pressure 109/59, pulse 94, temperature 97.9 F (36.6 C), resp. rate 16, height 5\' 7"  (1.702 m), weight 70.308 kg (155 lb), SpO2 99 %.  PHYSICAL EXAMINATION:  VITAL SIGNS: Filed Vitals:   2015/07/16 0837 07-16-15 0905  BP:  109/59  Pulse: 102 94  Temp:    Resp: 19 16   GENERAL:82 y.o.male currently in no acute distress.  HEAD: Normocephalic, atraumatic.  EYES: Pupils equal, round, reactive to light. Extraocular muscles intact. No scleral icterus.  MOUTH: Moist mucosal membrane. Dentition intact. No abscess noted.  EAR, NOSE, THROAT: Clear without exudates. No external lesions.  NECK: Supple. No thyromegaly. No nodules. No JVD.  PULMONARY: Bibasilar crackle, without wheeze rails or rhonci. No use of accessory muscles, Good respiratory effort. good air entry bilaterally CHEST: Nontender  to palpation.  CARDIOVASCULAR: S1 and S2. Regular rate and rhythm. No murmurs, rubs, or gallops. No edema. Pedal pulses 2+ bilaterally.  GASTROINTESTINAL: Soft, nontender, nondistended. No masses. Positive bowel sounds. No hepatosplenomegaly.  MUSCULOSKELETAL: No swelling, clubbing, or edema. Range of motion full in all extremities.  NEUROLOGIC: Cranial nerves II through XII are intact. No gross focal neurological deficits. Sensation intact. Reflexes intact.  SKIN: No ulceration, lesions, rashes, or cyanosis. Skin warm and dry. Turgor intact.  PSYCHIATRIC: Mood, affect within normal limits. The patient is awake, alert and oriented x 3. Insight, judgment intact.    LABORATORY PANEL:   CBC  Recent Labs Lab 2015-06-29 0757  WBC 16.1*  HGB 11.1*  HCT 33.9*  PLT 310   ------------------------------------------------------------------------------------------------------------------  Chemistries   Recent Labs Lab 2015-06-29 0757  NA 138  K 4.1  CL 102  CO2 22  GLUCOSE 201*  BUN 13  CREATININE 1.14  CALCIUM 9.0  AST 43*  ALT 18  ALKPHOS 45   BILITOT 0.5   ------------------------------------------------------------------------------------------------------------------  Cardiac Enzymes  Recent Labs Lab 2015/06/29 0757  TROPONINI 1.36*   ------------------------------------------------------------------------------------------------------------------  RADIOLOGY:  Dg Chest Portable 1 View  2015-06-29  CLINICAL DATA:  Shortness of breath with cough and chest pain EXAM: PORTABLE CHEST 1 VIEW COMPARISON:  May 29, 2015 FINDINGS: Underlying changes of COPD again noted. There is currently interstitial edema. Heart is mildly enlarged. There is mild pulmonary venous hypertension. No airspace consolidation. No adenopathy. There is degenerative change in each shoulder. IMPRESSION: Findings which peer consistent with congestive heart failure superimposed on COPD. No airspace consolidation. Electronically Signed   By: Bretta Bang III M.D.   On: 29-Jun-2015 08:07    EKG:   Orders placed or performed during the hospital encounter of 06/29/2015  . EKG 12-Lead  . EKG 12-Lead    IMPRESSION AND PLAN:   82 year old Caucasian M history of systolic congestive heart failure presenting with shortness of breath  1. Acute on chronic systolic congestive heart failure: Admit to stepdown given BiPAP, wean BiPAP as tolerated, continue diuresis IV Lasix follow daily weight urine output, consult cardiology, DuoNeb treatments, echocardiogram. Patient was placed on Levaquin for questionable pneumonia will not continue this, check pro-calcitonin if elevated continue antibiotics however story does not fit 2. COPD unspecified: COPD gold protocol continue home medicines 3. Essential hypertension hold Norvasc given relative hypotension continue beta blocker and ACE inhibitor as blood pressure allows 4. Elevated troponin: Place on heparin trend cardiac enzymes 5. Venous thrombi was a prophylactic: Therapeutic heparin    All the records are reviewed  and case discussed with ED provider. Management plans discussed with the patient, family and they are in agreement.  CODE STATUS: Full  TOTAL TIME TAKING CARE OF THIS PATIENT: 40 minutes.    Riyan Gavina,  Mardi Mainland.D on 29-Jun-2015 at 9:43 AM  Between 7am to 6pm - Pager - [phone]  After 6pm: House Pager: - [phone]  [facility]  Office  [phone]  CC: Primary care physician; Mila Merry, MD

## 2015-06-19 NOTE — ED Provider Notes (Signed)
[facility] Emergency Department Provider Note  ____________________________________________  Time seen: Approximately 8:28 AM  I have reviewed the triage vital signs and the nursing notes.   HISTORY  Chief Complaint Shortness of Breath    HPI Calvin Carpenter is a 82 y.o. male with coronary artery disease, CHF, AAA, hypertension, hyperlipidemia, atrial fibrillation who presents for evaluation of one day worsening shortness of breath, gradual onset, constant since onset, worse with lying flat and exertion. He is also had chest pain which is worse with lying flat. Mild cough. No fevers but he has had some chills. No vomiting or diarrhea. Called EMS and they gave an albuterol treatment in route to the emergency department. He reports this feels similar to his prior CHF exacerbations.   Past Medical History  Diagnosis Date  . Myocardial infarct [facility]) 04-23-13    CAD on cardiac cath, no PCI or CABG  . History of chicken pox   . History of measles   . History of mumps   . CHF (congestive heart failure) (HCC)     systolic dysfunction, EF 40-45%  . C. difficile colitis 12/02/2014    following prolonged antibiotic treatment for post op pneumonia   . AAA (abdominal aortic aneurysm) (HCC)   . Carotid stenosis   . HTN (hypertension)   . Hyperlipidemia   . Severe aortic stenosis   . COPD (chronic obstructive pulmonary disease) (HCC)   . Atrial fibrillation [facility])     Patient Active Problem List   Diagnosis Date Noted  . Hyperglycemia 01/30/2015  . Aortic valve disorder 12/02/2014  . Carotid arterial disease (HCC) 12/02/2014  . Insomnia 12/02/2014  . Peripheral vascular disease (HCC) 12/02/2014  . Shoulder strain 12/02/2014  . COPD (chronic obstructive pulmonary disease) (HCC) 12/02/2014  . Chronic diastolic heart failure (HCC) 10/24/2014  . Cardiomyopathy, ischemic 08/28/2014  . A-fib (HCC) 08/28/2014  . Myocardial ischemia 08/28/2014  . Claudication (HCC)  03/18/2014  . Anemia 05/16/2013  . Rotator cuff syndrome 10/28/2009  . Arteriosclerosis of coronary artery 10/27/2005  . Hemorrhoids without complication 03/21/2005  . Allergic rhinitis 10/29/2001  . AAA (abdominal aortic aneurysm) (HCC) 08/16/1996  . GERD (gastroesophageal reflux disease) 05/23/1994  . HLD (hyperlipidemia) 05/23/1994  . Essential (primary) hypertension 05/23/1994  . Former smoker 05/23/1994    Past Surgical History  Procedure Laterality Date  . Groin surgery  2011  . Carotid endarterectomy Right 2007    Sankar  . Vascular surgery Bilateral     Aortic aneurysm stent, right femoral-left femoral bypass  . Hernia repair Left 10-29-12 and 04-23-13    inguinal  . Femoral bypass  09/15/2010    Fem-Fem Bypass; Dr. Earnestine Leys, [facility]. Bilateral with femoral endartectomies. Excised graft June 2012 dur to infection  . Abdominal aortic aneurysm repair  2009  . Cataract extraction      Right eye 2001; Left eye 2000  . Prostate surgery  1993  . Kidney stone extraction  1980  . Cardiac catheterization  10/27/2005    Severe 2 vessel CAD with occluded prox RCA with collaterals. Lx occlusion. 95% OMI, 30% Mid LAD. Mild aortic stenosis  . Doppler echocardiography  09/08/2011    Mild global LV dysfunction EF=45%, MILD lvh, mild MI, TI. Severe AS    Current Outpatient Rx  Name  Route  Sig  Dispense  Refill  . albuterol (VENTOLIN HFA) 108 (90 BASE) MCG/ACT inhaler   Inhalation   Inhale 2 puffs into the lungs every 6 (six) hours as  needed for wheezing or shortness of breath.          Marland Kitchen amLODipine (NORVASC) 5 MG tablet   Oral   Take 1 tablet (5 mg total) by mouth daily.   90 tablet   4   . aspirin EC 81 MG tablet   Oral   Take 81 mg by mouth every morning.         . budesonide-formoterol (SYMBICORT) 80-4.5 MCG/ACT inhaler   Inhalation   Inhale 2 puffs into the lungs 2 (two) times daily.   1 Inhaler   12   . fluticasone (FLONASE) 50 MCG/ACT nasal spray   Each Nare    Place 1 spray into both nostrils daily as needed for allergies or rhinitis.          . furosemide (LASIX) 20 MG tablet      TAKE ONE (1) TABLET EACH DAY   90 tablet   3   . HYDROcodone-acetaminophen (NORCO/VICODIN) 5-325 MG tablet   Oral   Take 1 tablet by mouth every 6 (six) hours as needed for moderate pain.   30 tablet   0   . ipratropium-albuterol (DUONEB) 0.5-2.5 (3) MG/3ML SOLN   Nebulization   Take 3 mLs by nebulization every 6 (six) hours as needed (for shortness of breath/wheezing.).          Marland Kitchen metoprolol succinate (TOPROL-XL) 25 MG 24 hr tablet   Oral   Take 1 tablet (25 mg total) by mouth daily.   90 tablet   4   . montelukast (SINGULAIR) 10 MG tablet   Oral   Take 1 tablet (10 mg total) by mouth as needed.   90 tablet   1   . quinapril (ACCUPRIL) 20 MG tablet   Oral   Take 1 tablet (20 mg total) by mouth daily.   90 tablet   4   . simvastatin (ZOCOR) 40 MG tablet   Oral   Take 1 tablet (40 mg total) by mouth daily.   90 tablet   4   . verapamil (CALAN-SR) 180 MG CR tablet   Oral   Take 1 tablet (180 mg total) by mouth at bedtime.   90 tablet   3     Allergies Benazepril hcl; Fluvastatin sodium; and Mevacor  Family History  Problem Relation Age of Onset  . Diabetes Brother   . Diabetes Mother   . Ovarian cancer Mother   . Lung cancer Father     Social History Social History  Substance Use Topics  . Smoking status: Former Smoker -- 1.00 packs/day for 65 years    Types: Cigarettes    Quit date: 04/26/2011  . Smokeless tobacco: Never Used     Comment: Quit 2 years ago  . Alcohol Use: No    Review of Systems Constitutional: No fever/chills Eyes: No visual changes. ENT: No sore throat. Cardiovascular: + chest pain. Respiratory: +shortness of breath. Gastrointestinal: No abdominal pain.  No nausea, no vomiting.  No diarrhea.  No constipation. Genitourinary: Negative for dysuria. Musculoskeletal: Negative for back pain. Skin:  Negative for rash. Neurological: Negative for headaches, focal weakness or numbness.  10-point ROS otherwise negative.  ____________________________________________   PHYSICAL EXAM:  VITAL SIGNS: ED Triage Vitals  Enc Vitals Group     BP July 19, 2015 0731 107/64 mmHg     Pulse Rate 07/19/15 0728 114     Resp 07/19/15 0728 22     Temp 2015-07-19 0731 97.9 F (36.6 C)  Temp src --      SpO2 05/29/2015 0728 93 %     Weight 06/10/2015 0731 155 lb (70.308 kg)     Height 06/06/2015 0731 5\' 7"  (1.702 m)     Head Cir --      Peak Flow --      Pain Score 06/04/2015 0729 6     Pain Loc --      Pain Edu? --      Excl. in GC? --     Constitutional: Alert and oriented. In moderate respiratory distress speaking in short phrases/sentences. Eyes: Conjunctivae are normal. PERRL. EOMI. Head: Atraumatic. Nose: No congestion/rhinnorhea. Mouth/Throat: Mucous membranes are moist.  Oropharynx non-erythematous. Neck: No stridor.  No cervical spine tenderness to palpation. Cardiovascular: tachycardic rate, regular rhythm. Grossly normal heart sounds.  Good peripheral circulation. Respiratory: The kidney with increased respiratory effort, crackles and rales at bilateral bases. Gastrointestinal: Soft and nontender. No distention. No CVA tenderness. Genitourinary: deferred Musculoskeletal: No lower extremity tenderness, trace peripheral edema of bilateral lower extremities. Neurologic:  Normal speech and language. No gross focal neurologic deficits are appreciated.  Skin:  Skin is warm, dry and intact. No rash noted. Psychiatric: Mood and affect are normal. Speech and behavior are normal.  ____________________________________________   LABS (all labs ordered are listed, but only abnormal results are displayed)  Labs Reviewed  CBC WITH DIFFERENTIAL/PLATELET - Abnormal; Notable for the following:    WBC 16.1 (*)    RBC 3.58 (*)    Hemoglobin 11.1 (*)    HCT 33.9 (*)    RDW 15.0 (*)    Neutro Abs 11.9  (*)    Monocytes Absolute 1.5 (*)    All other components within normal limits  COMPREHENSIVE METABOLIC PANEL - Abnormal; Notable for the following:    Glucose, Bld 201 (*)    AST 43 (*)    GFR calc non Af Amer 58 (*)    All other components within normal limits  TROPONIN I - Abnormal; Notable for the following:    Troponin I 1.36 (*)    All other components within normal limits  LACTIC ACID, PLASMA - Abnormal; Notable for the following:    Lactic Acid, Venous 6.6 (*)    All other components within normal limits  BRAIN NATRIURETIC PEPTIDE - Abnormal; Notable for the following:    B Natriuretic Peptide 1357.0 (*)    All other components within normal limits  CULTURE, BLOOD (ROUTINE X 2)  CULTURE, BLOOD (ROUTINE X 2)  LACTIC ACID, PLASMA  URINALYSIS COMPLETEWITH MICROSCOPIC ([facility] ONLY)   ____________________________________________  EKG  ED ECG REPORT I, Gayla Doss, the attending physician, personally viewed and interpreted this ECG.   Date: 05/30/2015  EKG Time: 07:35  Rate: 110  Rhythm: sinus tachycardia  Axis: normal  Intervals:none  ST&T Change: No acute ST elevation. ST depression in V3, V4, V5, V6. LVH with QRS widening and repolarization abnormality.  ____________________________________________  RADIOLOGY  CXR IMPRESSION: Findings which peer consistent with congestive heart failure superimposed on COPD. No airspace consolidation. ____________________________________________   PROCEDURES  Procedure(s) performed: None  Critical Care performed: Yes, see critical care note(s). Total critical care time spent 35 minutes.  ____________________________________________   INITIAL IMPRESSION / ASSESSMENT AND PLAN / ED COURSE  Pertinent labs & imaging results that were available during my care of the patient were reviewed by me and considered in my medical decision making (see chart for details).   Calvin Carpenter is a 82 y.o.  male with coronary artery  disease, CHF, AAA, hypertension, hyperlipidemia, atrial fibrillation who presents for evaluation of one day worsening shortness of breath. On exam, he is in moderate respiratory distress with an increasing oxygen requirement. He is now on BiPAP with significant improvement in his work of breathing. Chest x-ray confirms CHF exacerbation. Lasix ordered. Awaiting labs. Anticipate admission.  ----------------------------------------- 9:17 AM on 06/07/2015 -----------------------------------------  Patient with continued improvement in his work of breathing on BiPAP. Labs reviewed and are notable for leukocytosis, elevated venous lactic acid at 6.6 and given upper respiratory symptoms and chills, blood cultures were ordered and we will give Levaquin. No IV fluids given current evidence of volume overload. CMP unremarkable. Troponin is elevated at 1.36, question NSTEMI versus demand ischemia. Aspirin ordered. Case discussed with hospitalist, Dr. Clint Guy, for admission at this time. ____________________________________________   FINAL CLINICAL IMPRESSION(S) / ED DIAGNOSES  Final diagnoses:  Acute on chronic congestive heart failure, unspecified congestive heart failure type (HCC)  Acute respiratory failure with hypoxia (HCC)      Gayla Doss, MD 06/11/2015 [phone]

## 2015-06-19 NOTE — Progress Notes (Signed)
*  PRELIMINARY RESULTS* °Echocardiogram °2D Echocardiogram has been performed. ° °Calvin Carpenter °06/16/2015, 8:26 PM °

## 2015-06-19 NOTE — Progress Notes (Signed)
ANTICOAGULATION CONSULT NOTE - Initial Consult  Pharmacy Consult for heparin drip dosing and monitoring Indication: chest pain/ACS  Allergies  Allergen Reactions  . Benazepril Hcl Rash  . Fluvastatin Sodium Rash  . Mevacor [Lovastatin] Rash    Patient Measurements: Height:  (170.2 cm) Weight: 155 lb (70.308 kg) IBW/kg (Calculated) : 66.1 Heparin Dosing Weight: 70.3 kg  Vital Signs: Temp: 97.9 F (36.6 C) (02/24 0731) BP: 109/59 mmHg (02/24 0905) Pulse Rate: 94 (02/24 0905)  Labs:  Recent Labs  06/01/2015 0757  HGB 11.1*  HCT 33.9*  PLT 310  CREATININE 1.14  TROPONINI 1.36*    Estimated Creatinine Clearance: 46.7 mL/min (by C-G formula based on Cr of 1.14).   Medical History: Past Medical History  Diagnosis Date  . Myocardial infarct [facility]) 04-23-13    CAD on cardiac cath, no PCI or CABG  . History of chicken pox   . History of measles   . History of mumps   . CHF (congestive heart failure) (HCC)     systolic dysfunction, EF 40-45%  . C. difficile colitis 12/02/2014    following prolonged antibiotic treatment for post op pneumonia   . AAA (abdominal aortic aneurysm) (HCC)   . Carotid stenosis   . HTN (hypertension)   . Hyperlipidemia   . Severe aortic stenosis   . COPD (chronic obstructive pulmonary disease) (HCC)   . Atrial fibrillation [facility])    Assessment: Pharmacy consulted to dose and monitor heparin drip in this 82 year old male presenting with SOB and elevated troponin of 1.36. Patient has cardiac history including MI, HF, AAA, atrial fibrillation, and severe aortic stenosis. Patient not taking anticoagulants prior to admission per med rec and recent outpatient notes.  Baseline labs: Hgb: 11.1 Plt 310  APTT & INR ordered STAT  Goal of Therapy:  Heparin level 0.3-0.7 units/ml Monitor platelets by anticoagulation protocol: Yes   Plan:  Give 3500 units bolus x 1 (50 units/kg) Start heparin infusion at 850 units/hr (~12 units/kg/hr) Check  anti-Xa level in 8 hours and daily while on heparin Continue to monitor H&H and platelets  Jodelle Red Yassmine Tamm 06/16/2015,9:45 AM

## 2015-06-19 NOTE — ED Notes (Signed)
States he developed some upper back/chest discomfort for the past couple of days   No fever. resp labored with exertion .

## 2015-06-19 NOTE — Progress Notes (Addendum)
ANTICOAGULATION CONSULT NOTE - Initial Consult  Pharmacy Consult for heparin drip dosing and monitoring Indication: chest pain/ACS  Allergies  Allergen Reactions  . Benazepril Hcl Rash  . Fluvastatin Sodium Rash  . Mevacor [Lovastatin] Rash    Patient Measurements: Height:  (170.2 cm) Weight: 157 lb 6.5 oz (71.4 kg) IBW/kg (Calculated) : 66.1 Heparin Dosing Weight: 70.3 kg  Vital Signs: Temp: 98.2 F (36.8 C) (02/24 1700) Temp Source: Oral (02/24 1700) BP: 100/66 mmHg (02/24 1800) Pulse Rate: 104 (02/24 1800)  Labs:  Recent Labs  07-08-15 0757 Jul 08, 2015 1036 July 08, 2015 1213 07/08/15 1824  HGB 11.1*  --   --   --   HCT 33.9*  --   --   --   PLT 310  --   --   --   APTT  --  27  --   --   LABPROT  --  14.9  --   --   INR  --  1.15  --   --   HEPARINUNFRC  --   --   --  0.24*  CREATININE 1.14  --   --   --   TROPONINI 1.36*  --  1.94* 3.30*    Estimated Creatinine Clearance: 46.7 mL/min (by C-G formula based on Cr of 1.14).   Medical History: Past Medical History  Diagnosis Date  . Myocardial infarct [facility]) 04-23-13    CAD on cardiac cath, no PCI or CABG  . History of chicken pox   . History of measles   . History of mumps   . CHF (congestive heart failure) (HCC)     systolic dysfunction, EF 40-45%  . C. difficile colitis 12/02/2014    following prolonged antibiotic treatment for post op pneumonia   . AAA (abdominal aortic aneurysm) (HCC)   . Carotid stenosis   . HTN (hypertension)   . Hyperlipidemia   . Severe aortic stenosis   . COPD (chronic obstructive pulmonary disease) (HCC)   . Atrial fibrillation [facility])    Assessment: Pharmacy consulted to dose and monitor heparin drip in this 82 year old male presenting with SOB and elevated troponin of 1.36. Patient has cardiac history including MI, HF, AAA, atrial fibrillation, and severe aortic stenosis. Patient not taking anticoagulants prior to admission per med rec and recent outpatient notes.  Baseline  labs: Hgb: 11.1 Plt 310  APTT & INR ordered STAT  Goal of Therapy:  Heparin level 0.3-0.7 units/ml Monitor platelets by anticoagulation protocol: Yes   Plan:  Give 3500 units bolus x 1 (50 units/kg) Start heparin infusion at 850 units/hr (~12 units/kg/hr) Check anti-Xa level in 8 hours and daily while on heparin Continue to monitor H&H and platelets  2/24:  HL @ 18:24 = 0.24 Will order Heparin 1050 units IV X 1 bolus and increase drip rate to 1100 units/hr. Will recheck HL 8 hrs after rate change on 2/25 @ 0400.   Aldahir Litaker D 07/08/2015,7:41 PM

## 2015-06-19 NOTE — Progress Notes (Signed)
PT Cancellation Note  Patient Details Name: Calvin Carpenter MRN: [id number] DOB: [birth year]   Cancelled Treatment:     Spoke with nursing who reports that pt is not yet settled in, has low BP and is still having some SOB.  Would like to hold off PT until tomorrow.    Loran Senters, PT, DPT [phone]  Malachi Pro 07-04-15, 4:17 PM

## 2015-06-19 NOTE — Progress Notes (Signed)
OT Cancellation Note  Patient Details Name: MOMODOU CONSIGLIO MRN: [id number] DOB: 21-Sep-1930   Cancelled Treatment:    Reason Eval/Treat Not Completed: Medical issues which prohibited therapy. Holding Occupational Therapy secondary to breathing issues per nursing.  Ocie Cornfield 06/05/2015, 4:18 PM

## 2015-06-19 NOTE — Progress Notes (Signed)
Initial Nutrition Assessment    INTERVENTION:   Medical Food Supplement Therapy: if po intake inadequate on follow-up, recommend addition of nutritional supplement  Meals and Snacks: Cater to patient preferences  NUTRITION DIAGNOSIS:   Inadequate oral intake related to acute illness as evidenced by per patient/family report (on Bipap).   GOAL:   Patient will meet greater than or equal to 90% of their needs  MONITOR:    (Energy Intake, Pulmonary, Anthropometrics, Digestive System)  REASON FOR ASSESSMENT:   Consult COPD Protocol  ASSESSMENT:    Pt admitted with acute respiratory failure with acute on chronic CHF, COPD; currently on BiPap  Past Medical History  Diagnosis Date  . Myocardial infarct [facility]) 04-23-13    CAD on cardiac cath, no PCI or CABG  . History of chicken pox   . History of measles   . History of mumps   . CHF (congestive heart failure) (HCC)     systolic dysfunction, EF 40-45%  . C. difficile colitis 12/02/2014    following prolonged antibiotic treatment for post op pneumonia   . AAA (abdominal aortic aneurysm) (HCC)   . Carotid stenosis   . HTN (hypertension)   . Hyperlipidemia   . Severe aortic stenosis   . COPD (chronic obstructive pulmonary disease) (HCC)   . Atrial fibrillation (HCC)     Diet Order:  Diet Heart Room service appropriate?: Yes; Fluid consistency:: Thin   Energy Intake: pt has not eaten anything today, currently on Bipap  Food and nutrition related history: poor appetite for the last few days but otherwise eating well  Skin:  Reviewed, no issues    Recent Labs Lab 06/02/2015 0757  NA 138  K 4.1  CL 102  CO2 22  BUN 13  CREATININE 1.14  CALCIUM 9.0  GLUCOSE 201*    Glucose Profile: No results for input(s): GLUCAP in the last 72 hours. Meds: lasix   Height:   Ht Readings from Last 1 Encounters:  06/22/2015  (1.702 m)    Weight: some weight gain due to fluid, otherwise wt has been stable at home  Wt  Readings from Last 1 Encounters:  06/10/2015 157 lb 6.5 oz (71.4 kg)   Filed Weights   06/11/2015 0731 06/22/2015 1130  Weight: 155 lb (70.308 kg) 157 lb 6.5 oz (71.4 kg)   Wt Readings from Last 10 Encounters:  06/18/2015 157 lb 6.5 oz (71.4 kg)  06/09/15 150 lb (68.04 kg)  06/02/15 150 lb (68.04 kg)  05/29/15 156 lb (70.761 kg)  05/23/15 159 lb (72.122 kg)  04/28/15 162 lb (73.483 kg)  04/22/15 159 lb (72.122 kg)  04/17/15 159 lb 6.4 oz (72.303 kg)  04/10/15 169 lb (76.658 kg)  02/12/15 160 lb (72.576 kg)    BMI:  Body mass index is 24.65 kg/(m^2).  LOW Care Level  Romelle Starcher MS, [place], LDN [phone] Pager  [phone] Weekend/On-Call Pager

## 2015-06-20 ENCOUNTER — Inpatient Hospital Stay: Payer: PPO

## 2015-06-20 DIAGNOSIS — J9601 Acute respiratory failure with hypoxia: Secondary | ICD-10-CM

## 2015-06-20 LAB — BLOOD GAS, ARTERIAL
ACID-BASE DEFICIT: 4.4 mmol/L — AB (ref 0.0–2.0)
ALLENS TEST (PASS/FAIL): POSITIVE — AB
BICARBONATE: 18.8 meq/L — AB (ref 21.0–28.0)
Delivery systems: POSITIVE
Expiratory PAP: 6
FIO2: 45
Inspiratory PAP: 16
O2 SAT: 90.3 %
PATIENT TEMPERATURE: 37
PH ART: 7.42 (ref 7.350–7.450)
PO2 ART: 58 mmHg — AB (ref 83.0–108.0)
pCO2 arterial: 29 mmHg — ABNORMAL LOW (ref 32.0–48.0)

## 2015-06-20 LAB — BASIC METABOLIC PANEL
ANION GAP: 9 (ref 5–15)
BUN: 23 mg/dL — ABNORMAL HIGH (ref 6–20)
CHLORIDE: 99 mmol/L — AB (ref 101–111)
CO2: 26 mmol/L (ref 22–32)
Calcium: 9.2 mg/dL (ref 8.9–10.3)
Creatinine, Ser: 1.19 mg/dL (ref 0.61–1.24)
GFR calc non Af Amer: 55 mL/min — ABNORMAL LOW (ref >60)
Glucose, Bld: 164 mg/dL — ABNORMAL HIGH (ref 65–99)
Potassium: 5.1 mmol/L (ref 3.5–5.1)
Sodium: 134 mmol/L — ABNORMAL LOW (ref 135–145)

## 2015-06-20 LAB — CBC
HCT: 31 % — ABNORMAL LOW (ref 40.0–52.0)
HEMOGLOBIN: 10.4 g/dL — AB (ref 13.0–18.0)
MCH: 30.7 pg (ref 26.0–34.0)
MCHC: 33.5 g/dL (ref 32.0–36.0)
MCV: 91.8 fL (ref 80.0–100.0)
Platelets: 276 10*3/uL (ref 150–440)
RBC: 3.38 MIL/uL — AB (ref 4.40–5.90)
RDW: 15 % — ABNORMAL HIGH (ref 11.5–14.5)
WBC: 12.6 10*3/uL — AB (ref 3.8–10.6)

## 2015-06-20 LAB — TROPONIN I: Troponin I: 4.51 ng/mL — ABNORMAL HIGH (ref <0.031)

## 2015-06-20 LAB — HEPARIN LEVEL (UNFRACTIONATED)
HEPARIN UNFRACTIONATED: 0.49 [IU]/mL (ref 0.30–0.70)
Heparin Unfractionated: 0.54 IU/mL (ref 0.30–0.70)

## 2015-06-20 MED ORDER — IPRATROPIUM-ALBUTEROL 0.5-2.5 (3) MG/3ML IN SOLN: 3.0000 mL | RESPIRATORY_TRACT | Status: DC

## 2015-06-20 MED ORDER — CHLORHEXIDINE GLUCONATE 0.12 % MT SOLN
15.0000 mL | Freq: Two times a day (BID) | OROMUCOSAL | Status: DC
  Administered 2015-06-20 – 2015-06-22 (×5): 15 mL via OROMUCOSAL
  Filled 2015-06-20 (×4): qty 15

## 2015-06-20 MED ORDER — SODIUM CHLORIDE 0.9 % IV BOLUS (SEPSIS)
500.0000 mL | Freq: Once | INTRAVENOUS | Status: AC
  Administered 2015-06-20: 500 mL via INTRAVENOUS

## 2015-06-20 MED ORDER — IPRATROPIUM-ALBUTEROL 0.5-2.5 (3) MG/3ML IN SOLN
3.0000 mL | Freq: Four times a day (QID) | RESPIRATORY_TRACT | Status: DC
  Administered 2015-06-20 – 2015-06-22 (×6): 3 mL via RESPIRATORY_TRACT
  Filled 2015-06-20 (×7): qty 3

## 2015-06-20 MED ORDER — CETYLPYRIDINIUM CHLORIDE 0.05 % MT LIQD
7.0000 mL | Freq: Two times a day (BID) | OROMUCOSAL | Status: DC
  Administered 2015-06-20 – 2015-06-22 (×6): 7 mL via OROMUCOSAL

## 2015-06-20 MED ORDER — METHYLPREDNISOLONE SODIUM SUCC 125 MG IJ SOLR
60.0000 mg | Freq: Two times a day (BID) | INTRAMUSCULAR | Status: DC
  Administered 2015-06-20 (×2): 60 mg via INTRAVENOUS
  Filled 2015-06-20 (×2): qty 2

## 2015-06-20 MED ORDER — METHYLPREDNISOLONE SODIUM SUCC 125 MG IJ SOLR
125.0000 mg | Freq: Once | INTRAMUSCULAR | Status: AC
  Administered 2015-06-20: 125 mg via INTRAVENOUS
  Filled 2015-06-20: qty 2

## 2015-06-20 MED ORDER — FUROSEMIDE 10 MG/ML IJ SOLN: 40.0000 mg | Freq: Every day | INTRAMUSCULAR | Status: DC

## 2015-06-20 NOTE — Progress Notes (Signed)
[facility] [place] at [facility]   PATIENT NAME: Calvin Carpenter    MR#:  [id number]  DATE OF BIRTH:  15-Feb-1931  SUBJECTIVE:  Remains on BiPAP intensive care unit Does complain of some shortness of breath Noted hypotension  REVIEW OF SYSTEMS:  CONSTITUTIONAL: No fever, fatigue or weakness.  EYES: No blurred or double vision.  EARS, NOSE, AND THROAT: No tinnitus or ear pain.  RESPIRATORY: No cough, positive shortness of breath, wheezing denies hemoptysis.  CARDIOVASCULAR: No chest pain, orthopnea, edema.  GASTROINTESTINAL: No nausea, vomiting, diarrhea or abdominal pain.  GENITOURINARY: No dysuria, hematuria.  ENDOCRINE: No polyuria, nocturia,  HEMATOLOGY: No anemia, easy bruising or bleeding SKIN: No rash or lesion. MUSCULOSKELETAL: No joint pain or arthritis.   NEUROLOGIC: No tingling, numbness, weakness.  PSYCHIATRY: No anxiety or depression.   DRUG ALLERGIES:   Allergies  Allergen Reactions  . Benazepril Hcl Rash  . Fluvastatin Sodium Rash  . Mevacor [Lovastatin] Rash    VITALS:  Blood pressure 83/63, pulse 98, temperature 97.4 F (36.3 C), temperature source Axillary, resp. rate 23, height  (1.702 m), weight 71.8 kg (158 lb 4.6 oz), SpO2 88 %.  PHYSICAL EXAMINATION:  VITAL SIGNS: Filed Vitals:   06/20/15 1100 06/20/15 1200  BP:  83/63  Pulse: 88 98  Temp:    Resp: 21 23   GENERAL:82 y.o.male currently critically ill  HEAD: Normocephalic, atraumatic.  EYES: Pupils equal, round, reactive to light. Extraocular muscles intact. No scleral icterus.  MOUTH: Moist mucosal membrane. Dentition intact. No abscess noted.  EAR, NOSE, THROAT: Clear without exudates. No external lesions.  NECK: Supple. No thyromegaly. No nodules. No JVD.  PULMONARY: Diminished breath sounds basilar rhonchi expiratory wheeze. No use of accessory muscles, poor respiratory effort. good air entry bilaterally on BiPAP CHEST: Nontender to palpation.   CARDIOVASCULAR: S1 and S2. Regular rate and rhythm. No murmurs, rubs, or gallops. No edema. Pedal pulses 2+ bilaterally.  GASTROINTESTINAL: Soft, nontender, nondistended. No masses. Positive bowel sounds. No hepatosplenomegaly.  MUSCULOSKELETAL: No swelling, clubbing, or edema. Range of motion full in all extremities.  NEUROLOGIC: Cranial nerves II through XII are intact. No gross focal neurological deficits. Sensation intact. Reflexes intact.  SKIN: No ulceration, lesions, rashes, or cyanosis. Skin warm and dry. Turgor intact.  PSYCHIATRIC: Mood, affect within normal limits. The patient is awake, alert and oriented x 3. Insight, judgment intact.      LABORATORY PANEL:   CBC  Recent Labs Lab 06/20/15 0422  WBC 12.6*  HGB 10.4*  HCT 31.0*  PLT 276   ------------------------------------------------------------------------------------------------------------------  Chemistries   Recent Labs Lab 06/20/2015 0757 06/20/15 0422  NA 138 134*  K 4.1 5.1  CL 102 99*  CO2 22 26  GLUCOSE 201* 164*  BUN 13 23*  CREATININE 1.14 1.19  CALCIUM 9.0 9.2  AST 43*  --   ALT 18  --   ALKPHOS 45  --   BILITOT 0.5  --    ------------------------------------------------------------------------------------------------------------------  Cardiac Enzymes  Recent Labs Lab 06/20/15 0117  TROPONINI 4.51*   ------------------------------------------------------------------------------------------------------------------  RADIOLOGY:  Dg Chest Port 1 View  06/20/2015  CLINICAL DATA:  Evaluate line placement. EXAM: PORTABLE CHEST 1 VIEW COMPARISON:  Chest x-ray from earlier today FINDINGS: Increasing focal infiltrate in the medial right lung base is seen. Developing pneumonia is not excluded. Recommend clinical correlation. There may also be increasing opacity in left retrocardiac region. No overt edema. The cardiomediastinal silhouette is stable. The new right PICC line  terminates in the  central SVC. IMPRESSION: 1. The right PICC line is in good position. 2. Increasing focal opacity in the medial right lung base and possibly the left retrocardiac region. Recommend clinical correlation and follow-up to resolution. Electronically Signed   By: Gerome Sam III M.D   On: 06/20/2015 10:57   Dg Chest Port 1 View  06/20/2015  CLINICAL DATA:  Shortness of breath. EXAM: PORTABLE CHEST 1 VIEW COMPARISON:  June 19, 2015 FINDINGS: No pneumothorax. Stable cardiomegaly. The hila and mediastinum are unchanged. Mild increased interstitial prominence consistent mild edema. More focal opacity in medial right lung base is more prominent in the interval. Recommend follow-up to resolution. IMPRESSION: Persistent mild edema. Opacity in the medial right lung base is more focal in the interval. Recommend attention on follow-up. Electronically Signed   By: Gerome Sam III M.D   On: 06/20/2015 09:34   Dg Chest Portable 1 View  06/13/2015  CLINICAL DATA:  Shortness of breath with cough and chest pain EXAM: PORTABLE CHEST 1 VIEW COMPARISON:  May 29, 2015 FINDINGS: Underlying changes of COPD again noted. There is currently interstitial edema. Heart is mildly enlarged. There is mild pulmonary venous hypertension. No airspace consolidation. No adenopathy. There is degenerative change in each shoulder. IMPRESSION: Findings which peer consistent with congestive heart failure superimposed on COPD. No airspace consolidation. Electronically Signed   By: Bretta Bang III M.D.   On: 06/01/2015 08:07    EKG:   Orders placed or performed during the hospital encounter of 06/07/2015  . EKG 12-Lead  . EKG 12-Lead    ASSESSMENT AND PLAN:   82 year old Caucasian Male history of systolic congestive heart failure presenting with shortness of breath admitted 06/10/2015  1. Acute on chronic systolic congestive heart failure: Decrease IV Lasix follow daily weight urine output, cardiology input appreciated, DuoNeb  treatments, echocardiogram.pro-calcitonin within normal limits -hold antibiotics 2. COPD exacerbation: Add steroids and DuoNeb treatments given wheezing COPD gold protocol continue home medicines 3. Essential hypertension hold Norvasc given relative hypotension continue beta blocker and ACE inhibitor as blood pressure allows 4. Nstemi: Continue therapeutic heparin cardiac catheter when tolerated  5. Venous thrombi was a prophylactic: Therapeutic heparin  Disposition: Discussed with family overall poor prognosis given heart and lung conditions. If required intubation would likely not be out of the extubated. They do not wish to be DO NOT RESUSCITATE at this time but are considering    All the records are reviewed and case discussed with Care Management/Social Workerr. Management plans discussed with the patient, family and they are in agreement.  CODE STATUS: Full  TOTAL TIME TAKING CARE OF THIS PATIENT: 33 critical minutes.   POSSIBLE D/C IN 3-4 DAYS, DEPENDING ON CLINICAL CONDITION.   Jaivian Battaglini,  Mardi Mainland.D on 06/20/2015 at 1:57 PM  Between 7am to 6pm - Pager - [phone]  After 6pm: House Pager: - [phone]  [facility]  Office  [phone]  CC: Primary care physician; Mila Merry, MD

## 2015-06-20 NOTE — Progress Notes (Signed)
OT Cancellation Note  Patient Details Name: Calvin Carpenter MRN: [id number] DOB: 1930/11/30   Cancelled Treatment:    Reason Eval/Treat Not Completed: Medical issues which prohibited therapy. Per nursing notes, patient is still medically unstable for rehab services at this time. Patient is hold and attempts to see will be assessed as patient stabilizes.   Brandyn Thien Toma Copier, OTR/L 06/20/2015, 1:18 PM

## 2015-06-20 NOTE — Progress Notes (Signed)
ANTICOAGULATION CONSULT NOTE - Initial Consult  Pharmacy Consult for heparin drip dosing and monitoring Indication: chest pain/ACS  Allergies  Allergen Reactions  . Benazepril Hcl Rash  . Fluvastatin Sodium Rash  . Mevacor [Lovastatin] Rash    Patient Measurements: Height:  (170.2 cm) Weight: 158 lb 4.6 oz (71.8 kg) IBW/kg (Calculated) : 66.1 Heparin Dosing Weight: 70.3 kg  Vital Signs: Temp: 97.8 F (36.6 C) (02/24 2000) Temp Source: Axillary (02/24 2000) BP: 96/71 mmHg (02/25 0400) Pulse Rate: 114 (02/25 0400)  Labs:  Recent Labs  06/18/2015 0757 06/08/2015 1036 06/21/2015 1213 06/20/2015 1824 06/20/15 0117 06/20/15 0422  HGB 11.1*  --   --   --   --  10.4*  HCT 33.9*  --   --   --   --  31.0*  PLT 310  --   --   --   --  276  APTT  --  27  --   --   --   --   LABPROT  --  14.9  --   --   --   --   INR  --  1.15  --   --   --   --   HEPARINUNFRC  --   --   --  0.24*  --  0.49  CREATININE 1.14  --   --   --   --  1.19  TROPONINI 1.36*  --  1.94* 3.30* 4.51*  --     Estimated Creatinine Clearance: 44.7 mL/min (by C-G formula based on Cr of 1.19).   Medical History: Past Medical History  Diagnosis Date  . Myocardial infarct [facility]) 04-23-13    CAD on cardiac cath, no PCI or CABG  . History of chicken pox   . History of measles   . History of mumps   . CHF (congestive heart failure) (HCC)     systolic dysfunction, EF 40-45%  . C. difficile colitis 12/02/2014    following prolonged antibiotic treatment for post op pneumonia   . AAA (abdominal aortic aneurysm) (HCC)   . Carotid stenosis   . HTN (hypertension)   . Hyperlipidemia   . Severe aortic stenosis   . COPD (chronic obstructive pulmonary disease) (HCC)   . Atrial fibrillation [facility])    Assessment: Pharmacy consulted to dose and monitor heparin drip in this 82 year old male presenting with SOB and elevated troponin of 1.36. Patient has cardiac history including MI, HF, AAA, atrial fibrillation, and  severe aortic stenosis. Patient not taking anticoagulants prior to admission per med rec and recent outpatient notes.  Baseline labs: Hgb: 11.1 Plt 310  APTT & INR ordered STAT  Goal of Therapy:  Heparin level 0.3-0.7 units/ml Monitor platelets by anticoagulation protocol: Yes   Plan:  Give 3500 units bolus x 1 (50 units/kg) Start heparin infusion at 850 units/hr (~12 units/kg/hr) Check anti-Xa level in 8 hours and daily while on heparin Continue to monitor H&H and platelets  2/24:  HL @ 18:24 = 0.24 Will order Heparin 1050 units IV X 1 bolus and increase drip rate to 1100 units/hr. Will recheck HL 8 hrs after rate change on 2/25 @ 0400.   02/25 04:00 heparin level 0.49. Continue current regimen and recheck in 8 hours to confirm.  Calvin Carpenter S 06/20/2015,5:17 AM

## 2015-06-20 NOTE — Progress Notes (Signed)
LCSW awaiting feedback from [facility] and will advise family once patient is medically stable  Calvin Buerkle LCSW

## 2015-06-20 NOTE — Progress Notes (Signed)
LCSW looked up patient PASSR number [id number] and amended the Fl2  Calvin Leeth LCSW

## 2015-06-20 NOTE — Progress Notes (Signed)
[facility] Encounter Note  Patient: Calvin Carpenter / Admit Date: June 21, 2015 / Date of Encounter: 06/20/2015, 9:03 AM   Subjective: Patient is still short of breath weak and fatigue with hypotension. Patient has fewer urine output at this time  Review of Systems: Positive for: Shortness of breath weakness and fatigue Negative for: Vision change, hearing change, syncope, dizziness, nausea, vomiting,diarrhea, bloody stool, stomach pain, cough, congestion, diaphoresis, urinary frequency, urinary pain,skin lesions, skin rashes Others previously listed  Objective: Telemetry: Sinus tachycardia with bundle branch block Physical Exam: Blood pressure 79/64, pulse 114, temperature 97.4 F (36.3 C), temperature source Axillary, resp. rate 29, height 5\' 7"  (1.702 m), weight 158 lb 4.6 oz (71.8 kg), SpO2 95 %. Body mass index is 24.79 kg/(m^2). General: Well developed, well nourished, in no acute distress. Head: Normocephalic, atraumatic, sclera non-icteric, no xanthomas, nares are without discharge. Neck: No apparent masses Lungs: Normal respirations with no wheezes, no rhonchi, no rales , basilar crackles   Heart: Regular rate and rhythm, normal S1 soft S2, 2+ aortic murmur, no rub, no gallop, PMI is normal size and placement, carotid upstroke normal with bruit, jugular venous pressure normal Abdomen: Soft, non-tender, non-distended with normoactive bowel sounds. No hepatosplenomegaly. Abdominal aorta is normal size without bruit Extremities: Trace edema, no clubbing, no cyanosis, no ulcers,  Peripheral: 2+ radial, 2+ femoral, 2+ dorsal pedal pulses Neuro: Alert and oriented. Moves all extremities spontaneously. Psych:  Responds to questions appropriately with a normal affect.   Intake/Output Summary (Last 24 hours) at 06/20/15 0903 Last data filed at 06/20/15 0600  Gross per 24 hour  Intake 428.19 ml  Output    750 ml  Net -321.81 ml    Inpatient Medications:  .  antiseptic oral rinse  7 mL Mouth Rinse q12n4p  . aspirin EC  81 mg Oral BH-q7a  . chlorhexidine  15 mL Mouth Rinse BID  . furosemide  40 mg Intravenous BID  . heparin  3,500 Units Intravenous Once  . lisinopril  20 mg Oral Daily  . meloxicam  15 mg Oral Daily  . methylPREDNISolone (SOLU-MEDROL) injection  125 mg Intravenous Once  . methylPREDNISolone (SOLU-MEDROL) injection  60 mg Intravenous Q12H  . metoprolol succinate  25 mg Oral Daily  . mometasone-formoterol  2 puff Inhalation BID  . montelukast  10 mg Oral QHS  . simvastatin  40 mg Oral Daily  . sodium chloride  500 mL Intravenous Once  . sodium chloride flush  3 mL Intravenous Q12H  . verapamil  180 mg Oral QHS   Infusions:  . heparin 1,100 Units/hr (2015/06/21 2000)    Labs:  Recent Labs  Jun 21, 2015 0757 06/20/15 0422  NA 138 134*  K 4.1 5.1  CL 102 99*  CO2 22 26  GLUCOSE 201* 164*  BUN 13 23*  CREATININE 1.14 1.19  CALCIUM 9.0 9.2    Recent Labs  21-Jun-2015 0757  AST 43*  ALT 18  ALKPHOS 45  BILITOT 0.5  PROT 7.3  ALBUMIN 4.0    Recent Labs  June 21, 2015 0757 06/20/15 0422  WBC 16.1* 12.6*  NEUTROABS 11.9*  --   HGB 11.1* 10.4*  HCT 33.9* 31.0*  MCV 94.9 91.8  PLT 310 276    Recent Labs  Jun 21, 2015 0757 Jun 21, 2015 1213 2015/06/21 1824 06/20/15 0117  TROPONINI 1.36* 1.94* 3.30* 4.51*   Invalid input(s): POCBNP No results for input(s): HGBA1C in the last 72 hours.   Weights: Filed Weights   21-Jun-2015 0731 June 21, 2015 1130  06/20/15 0411  Weight: 155 lb (70.308 kg) 157 lb 6.5 oz (71.4 kg) 158 lb 4.6 oz (71.8 kg)     Radiology/Studies:  Dg Chest 2 View  05/29/2015  CLINICAL DATA:  Follow-up of pneumonia ; history of COPD and cardiomyopathy and previous tobacco use. EXAM: CHEST  2 VIEW COMPARISON:  CT scan of the chest and chest x-ray of May 23, 2015 FINDINGS: The lungs are mildly hyperinflated. A trace of pleural fluid persists on the left. The bibasilar infiltrates have resolved. The heart and  pulmonary vascularity are normal. The mediastinum is normal in width. There is stable tortuosity of the descending thoracic aorta. The bony thorax exhibits no acute abnormality. IMPRESSION: COPD. Interval clearing of the bibasilar infiltrates. A trace of pleural fluid persists on the left. Electronically Signed   By: David  [place] M.D.   On: 05/29/2015 11:33   Ct Angio Chest Pe W/cm &/or Wo Cm  05/23/2015  CLINICAL DATA:  Difficulty breathing. EXAM: CT ANGIOGRAPHY CHEST WITH CONTRAST TECHNIQUE: Multidetector CT imaging of the chest was performed using the standard protocol during bolus administration of intravenous contrast. Multiplanar CT image reconstructions and MIPs were obtained to evaluate the vascular anatomy. CONTRAST:  75mL OMNIPAQUE IOHEXOL 350 MG/ML SOLN COMPARISON:  04/25/2013. FINDINGS: Mediastinum / Lymph Nodes: There is no axillary lymphadenopathy. Scattered small lymph nodes in the mediastinum do not meet CT criteria for pathologic enlargement. No evidence for mediastinal lymphadenopathy. The heart is upper normal for size. No pericardial effusion. The esophagus has normal imaging features. No filling defect within the opacified pulmonary arteries to suggest the presence of an acute pulmonary embolus. No evidence for dissection flap in the thoracic aorta. Lungs / Pleura: Moderate changes of centrilobular emphysema noted bilaterally. Bronchial wall thickening is seen diffusely in both lungs. There is bilateral dependent collapse/ consolidation in the lower lobes. Small bilateral pleural effusions are noted. Upper Abdomen:  Unremarkable. MSK / Soft Tissues: Bone windows reveal no worrisome lytic or sclerotic osseous lesions. Review of the MIP images confirms the above findings. IMPRESSION: 1. No CT evidence for acute pulmonary embolus. 2. Bilateral lower lobe collapse/consolidation with small bilateral pleural effusions. 3. Moderate changes of centrilobular emphysema. Electronically Signed   By:  Kennith Center M.D.   On: 05/23/2015 18:46   Dg Chest Portable 1 View  2015-07-18  CLINICAL DATA:  Shortness of breath with cough and chest pain EXAM: PORTABLE CHEST 1 VIEW COMPARISON:  May 29, 2015 FINDINGS: Underlying changes of COPD again noted. There is currently interstitial edema. Heart is mildly enlarged. There is mild pulmonary venous hypertension. No airspace consolidation. No adenopathy. There is degenerative change in each shoulder. IMPRESSION: Findings which peer consistent with congestive heart failure superimposed on COPD. No airspace consolidation. Electronically Signed   By: Bretta Bang III M.D.   On: 07-18-15 08:07   Dg Chest Portable 1 View  05/23/2015  CLINICAL DATA:  Shortness of Breath EXAM: PORTABLE CHEST 1 VIEW COMPARISON:  04/22/2015 FINDINGS: Cardiac shadow is at the upper limits of normal in size. Aortic calcifications are noted. Bilateral basilar infiltrates are noted. No sizable effusion is seen. No bony abnormality is noted. IMPRESSION: Bibasilar infiltrates. Electronically Signed   By: Alcide Clever M.D.   On: 05/23/2015 10:59     Assessment and Recommendation  82 y.o. male with acute non-ST elevation myocardial infarction with acute on chronic systolic dysfunction multifactorial in nature including coronary artery disease diffuse in nature previously assessed by cardiac catheterization showing not to be amenable  for further intervention as well as severe aortic valve stenosis not amenable for surgical intervention and severe LV systolic dysfunction by recent echocardiogram showing ejection fraction of 15% 1. Continue gentle diuresis although would decrease dose at this time due to concerns of patient being dehydrated and hypotensive 2. 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 Continue aspirin for non-ST elevation myocardial  infarction 4. No current ability for further aggressive intervention likely due to severe aortic stenosis and severe LV systolic dysfunction with very high risk of complication and death with the possibility of this intervention 5. Begin ambulation if able  Signed, Arnoldo Hooker M.D. FACC

## 2015-06-20 NOTE — Clinical Social Work Note (Signed)
Clinical Social Work Assessment  Patient Details  Name: Calvin Carpenter MRN: [id number] Date of Birth: [birth year]  Date of referral:  06/20/15               Reason for consult:  Facility Placement                Permission sought to share information with:  Family Supports, Customer service manager Permission granted to share information::  Yes, Verbal Permission Granted  Name::     Vu Liebman [phone]  Agency::  yes -[facility]  Relationship::  Yes  Contact Information:  Yes  Housing/Transportation Living arrangements for the past 2 months:  [place] of Information:  Patient, Spouse Patient Interpreter Needed:  None Criminal Activity/Legal Involvement Pertinent to Current Situation/Hospitalization:  No - Comment as needed Significant Relationships:  Spouse, Siblings Lives with:  Spouse Do you feel safe going back to the place where you live?  Yes Need for family participation in patient care:  Yes (Comment)  Care giving concerns:  They feel patient would benefit for rehab at [facility] assessment / plan:  LCSW met with patient and family members ( Brother and sister in law, and his wife Lesleigh Noe [phone]) He was admitted yesterday and will need additional medical attention CHF. His wife would like him to go to [place]. Patient is oriented to person, place, situation and time. His wife reports that her husband is a big help around the house, he cooks and assists with chores and they have additional in home support to assist with cleaning. Patient is continent, good sight and hearing and mobile with no assistance. He has recently been struggling with weakness and shortness of breath. PT will complete there consult for full assessment. He is now off Bipap machine. LCSW will complete assessment,Fl2 and Passr and send information via [facility] to check bed availability.  Employment status:  Retired Forensic scientist:  Other (Comment Required)  Agricultural engineer) PT Recommendations:  [place], Not assessed at this time Information / Referral to community resources:  Acute Rehab  Patient/Family's Response to care:  They would like him to go to Rehab once released from [place]  Patient/Family's Understanding of and Emotional Response to Diagnosis, Current Treatment, and Prognosis:  Patients family very supportive and plan to provide additional home care once patient returned home after [facility] placement.  Emotional Assessment Appearance:  Appears stated age Attitude/Demeanor/Rapport:   Positive, friendly, cooperative Affect (typically observed):  Accepting, Happy, Hopeful, Appropriate, Pleasant Orientation:  Oriented to Self, Oriented to Place, Oriented to  Time, Oriented to Situation Alcohol / Substance use:  Never Used Psych involvement (Current and /or in the community):  No (Comment)  Discharge Needs  Concerns to be addressed:  No discharge needs identified Readmission within the last 30 days:  No Current discharge risk:  None Barriers to Discharge:  No Barriers Identified   Joana Reamer, LCSW 06/20/2015, 12:41 PM

## 2015-06-20 NOTE — [place] FL2 (Signed)
[place] MEDICAID FL2 LEVEL OF CARE SCREENING TOOL     IDENTIFICATION  Patient Name: Calvin Carpenter Birthdate: 09/29/1930 Sex: male Admission Date (Current Location): 05/27/2015  [place] and [place] Number:  Chiropodist and Address:  [facility], [address], [place], [place] [address]      Provider Number: [id number]  Attending Physician Name and Address:  Wyatt Haste, MD  Relative Name and Phone Number:       Current Level of Care: Hospital Recommended Level of Care: Skilled Nursing Facility Prior Approval Number:    Date Approved/Denied:   PASRR Number:  [id number]  Discharge Plan: SNF    Current Diagnoses: Patient Active Problem List   Diagnosis Date Noted  . Acute respiratory failure with hypoxia (HCC) Jun 23, 2015  . Hyperglycemia 01/30/2015  . Aortic valve disorder 12/02/2014  . Carotid arterial disease (HCC) 12/02/2014  . Insomnia 12/02/2014  . Peripheral vascular disease (HCC) 12/02/2014  . Shoulder strain 12/02/2014  . COPD (chronic obstructive pulmonary disease) (HCC) 12/02/2014  . Chronic diastolic heart failure (HCC) 10/24/2014  . Cardiomyopathy, ischemic 08/28/2014  . A-fib (HCC) 08/28/2014  . Myocardial ischemia 08/28/2014  . Claudication (HCC) 03/18/2014  . Anemia 05/16/2013  . Rotator cuff syndrome 10/28/2009  . Arteriosclerosis of coronary artery 10/27/2005  . Hemorrhoids without complication 03/21/2005  . Allergic rhinitis 10/29/2001  . AAA (abdominal aortic aneurysm) (HCC) 08/16/1996  . GERD (gastroesophageal reflux disease) 05/23/1994  . HLD (hyperlipidemia) 05/23/1994  . Essential (primary) hypertension 05/23/1994  . Former smoker 05/23/1994    Orientation RESPIRATION BLADDER Height & Weight     Self, Time, Situation  O2 (Nasal Canula) Continent Weight: 158 lb 4.6 oz (71.8 kg) Height:   (170.2 cm)  BEHAVIORAL SYMPTOMS/MOOD NEUROLOGICAL BOWEL NUTRITION STATUS      Continent Diet (Cardiac  diet- Heart Smart)  AMBULATORY STATUS COMMUNICATION OF NEEDS Skin   Independent Verbally Normal                       Personal Care Assistance Level of Assistance  Bathing, Feeding, Dressing, Total care Bathing Assistance: Limited assistance Feeding assistance: Independent Dressing Assistance: Limited assistance Total Care Assistance: Limited assistance   Functional Limitations Info  Sight, Hearing, Speech Sight Info: Adequate Hearing Info: Adequate Speech Info: Adequate    SPECIAL CARE FACTORS FREQUENCY  PT (By licensed PT)     PT Frequency: 5x week              Contractures      Additional Factors Info  Allergies   Allergies Info: Benzapril, Fluvastatin sodium, Mevacor           Current Medications (06/20/2015):  This is the current hospital active medication list Current Facility-Administered Medications  Medication Dose Route Frequency Provider Last Rate Last Dose  . acetaminophen (TYLENOL) tablet 650 mg  650 mg Oral Q6H PRN Wyatt Haste, MD       Or  . acetaminophen (TYLENOL) suppository 650 mg  650 mg Rectal Q6H PRN Wyatt Haste, MD      . ALPRAZolam Prudy Feeler) tablet 0.25 mg  0.25 mg Oral Q6H PRN Wyatt Haste, MD   0.25 mg at 06/20/15 1138  . antiseptic oral rinse (CPC / CETYLPYRIDINIUM CHLORIDE 0.05%) solution 7 mL  7 mL Mouth Rinse q12n4p Wyatt Haste, MD      . aspirin EC tablet 81 mg  81 mg Oral BH-q7a Wyatt Haste, MD  81 mg at 06/20/15 0620  . chlorhexidine (PERIDEX) 0.12 % solution 15 mL  15 mL Mouth Rinse BID Wyatt Haste, MD   15 mL at 06/20/15 0755  . fluticasone (FLONASE) 50 MCG/ACT nasal spray 1 spray  1 spray Each Nare Daily PRN Wyatt Haste, MD      . furosemide (LASIX) injection 40 mg  40 mg Intravenous BID Wyatt Haste, MD   40 mg at 06/20/15 0755  . heparin ADULT infusion 100 units/mL (25000 units/250 mL)  1,100 Units/hr Intravenous Continuous Wyatt Haste, MD 11 mL/hr at 06/20/15 1052 1,100 Units/hr at 06/20/15 1052  . heparin  bolus via infusion 3,500 Units  3,500 Units Intravenous Once Cindi Carbon, RPH      . ipratropium-albuterol (DUONEB) 0.5-2.5 (3) MG/3ML nebulizer solution 3 mL  3 mL Nebulization Q4H PRN Wyatt Haste, MD   3 mL at 06/20/15 0631  . lisinopril (PRINIVIL,ZESTRIL) tablet 20 mg  20 mg Oral Daily Wyatt Haste, MD   20 mg at 06/13/2015 1223  . meloxicam (MOBIC) tablet 15 mg  15 mg Oral Daily Wyatt Haste, MD   15 mg at 06/20/15 1137  . methylPREDNISolone sodium succinate (SOLU-MEDROL) 125 mg/2 mL injection 60 mg  60 mg Intravenous Q12H Wyatt Haste, MD   60 mg at 06/20/15 0930  . metoprolol succinate (TOPROL-XL) 24 hr tablet 25 mg  25 mg Oral Daily Wyatt Haste, MD   25 mg at 05/30/2015 1223  . mometasone-formoterol (DULERA) 100-5 MCG/ACT inhaler 2 puff  2 puff Inhalation BID Wyatt Haste, MD   2 puff at 06/20/15 0755  . montelukast (SINGULAIR) tablet 10 mg  10 mg Oral QHS Wyatt Haste, MD   10 mg at 06/02/2015 2137  . morphine 2 MG/ML injection 2 mg  2 mg Intravenous Q4H PRN Wyatt Haste, MD      . ondansetron [facility]) tablet 4 mg  4 mg Oral Q6H PRN Wyatt Haste, MD       Or  . ondansetron [facility]) injection 4 mg  4 mg Intravenous Q6H PRN Wyatt Haste, MD   4 mg at 06/20/2015 1330  . oxyCODONE (Oxy IR/ROXICODONE) immediate release tablet 5 mg  5 mg Oral Q4H PRN Wyatt Haste, MD      . simvastatin (ZOCOR) tablet 40 mg  40 mg Oral Daily Wyatt Haste, MD   40 mg at 06/20/15 1138  . sodium chloride flush (NS) 0.9 % injection 3 mL  3 mL Intravenous Q12H Wyatt Haste, MD   3 mL at 05/28/2015 2200  . verapamil (CALAN-SR) CR tablet 180 mg  180 mg Oral QHS Wyatt Haste, MD   180 mg at 06/07/2015 2137     Discharge Medications: Please see discharge summary for a list of discharge medications.  Relevant Imaging Results:  Relevant Lab Results:   Additional Information [id number]  Cheron Schaumann, [place]

## 2015-06-20 NOTE — Progress Notes (Signed)
LCSW met with family members and patients wife. His wife has specifically requested that patient when finished at [facility] he will go to rehab at [facility]. LCSW completed Fl2 as per pts request I sent info to [facility] Via hub. Awaiting a call back for bed offer.  Breklyn Fabrizio LCSW

## 2015-06-20 NOTE — Consult Note (Signed)
PULMONARY / CRITICAL CARE MEDICINE   Name: Calvin Carpenter MRN: [id number] DOB: 1930-05-28    ADMISSION DATE:  06/06/2015   CONSULTATION DATE:  06/20/15  REFERRING MD:  Dr. Clint Guy  CHIEF COMPLAINT:  Acute respiratory failure  HISTORY OF PRESENT ILLNESS:   This is an 82 yo male with a PMH of systolic heart failure, CAD s/p MI/PCI/CABG, AAA, hypertension, AS, afib and COPD who presented with shortness of breath and shortness of breath with exertion x 2 days and intermittent chest pain. He was started on continuous BiPAP for severe respiratory distress and hypoxia. He was admitted to the ICU and CCM was consulted to assist with patient's management. Patient is now on High flow [place] and SPO2 is between 88-92%. Still hypotensive with systolic BP in the low 80s  PAST MEDICAL HISTORY :  He  has a past medical history of Myocardial infarct (HCC) (04-23-13); History of chicken pox; History of measles; History of mumps; CHF (congestive heart failure) (HCC); C. difficile colitis (12/02/2014); AAA (abdominal aortic aneurysm) (HCC); Carotid stenosis; HTN (hypertension); Hyperlipidemia; Severe aortic stenosis; COPD (chronic obstructive pulmonary disease) (HCC); and Atrial fibrillation (HCC).  PAST SURGICAL HISTORY: He  has past surgical history that includes groin surgery (2011); Carotid endarterectomy (Right, 2007); Vascular surgery (Bilateral); Hernia repair (Left, 10-29-12 and 04-23-13); Femoral bypass (09/15/2010); Abdominal aortic aneurysm repair (2009); Cataract extraction; Prostate surgery (1993); KIDNEY STONE EXTRACTION (1980); Cardiac catheterization (10/27/2005); and doppler echocardiography (09/08/2011).  Allergies  Allergen Reactions  . Benazepril Hcl Rash  . Fluvastatin Sodium Rash  . Mevacor [Lovastatin] Rash    No current facility-administered medications on file prior to encounter.   Current Outpatient Prescriptions on File Prior to Encounter  Medication Sig  . albuterol (VENTOLIN HFA) 108  (90 BASE) MCG/ACT inhaler Inhale 2 puffs into the lungs every 6 (six) hours as needed for wheezing or shortness of breath.   Marland Kitchen aspirin EC 81 MG tablet Take 81 mg by mouth every morning.  . budesonide-formoterol (SYMBICORT) 80-4.5 MCG/ACT inhaler Inhale 2 puffs into the lungs 2 (two) times daily.  . fluticasone (FLONASE) 50 MCG/ACT nasal spray Place 1 spray into both nostrils daily as needed for allergies or rhinitis.   . furosemide (LASIX) 20 MG tablet TAKE ONE (1) TABLET EACH DAY  . HYDROcodone-acetaminophen (NORCO/VICODIN) 5-325 MG tablet Take 1 tablet by mouth every 6 (six) hours as needed for moderate pain.  Marland Kitchen ipratropium-albuterol (DUONEB) 0.5-2.5 (3) MG/3ML SOLN Take 3 mLs by nebulization every 6 (six) hours as needed (for shortness of breath/wheezing.).   Marland Kitchen montelukast (SINGULAIR) 10 MG tablet Take 1 tablet (10 mg total) by mouth as needed.  . quinapril (ACCUPRIL) 20 MG tablet Take 1 tablet (20 mg total) by mouth daily.  . simvastatin (ZOCOR) 40 MG tablet Take 1 tablet (40 mg total) by mouth daily.  . verapamil (CALAN-SR) 180 MG CR tablet Take 1 tablet (180 mg total) by mouth at bedtime.  Marland Kitchen amLODipine (NORVASC) 5 MG tablet Take 1 tablet (5 mg total) by mouth daily.    FAMILY HISTORY:  His indicated that his mother is deceased. He indicated that his father is deceased. He indicated that his brother is alive. He indicated that his son is deceased.   SOCIAL HISTORY: He  reports that he quit smoking about 4 years ago. His smoking use included Cigarettes. He has a 65 pack-year smoking history. He has never used smokeless tobacco. He reports that he does not drink alcohol or use illicit drugs.  REVIEW OF  SYSTEMS:   Positive for SOB/SOBE and generalized weakness. All other systems are negative  SUBJECTIVE:    VITAL SIGNS: BP 83/63 mmHg  Pulse 98  Temp(Src) 97.4 F (36.3 C) (Axillary)  Resp 23  Ht  (1.702 m)  Wt 158 lb 4.6 oz (71.8 kg)  BMI 24.79 kg/m2  SpO2  88%  HEMODYNAMICS:    VENTILATOR SETTINGS: Vent Mode:  [-]  FiO2 (%):  [35 %] 35 %  INTAKE / OUTPUT: I/O last 3 completed shifts: In: 439.2 [P.O.:240; I.V.:199.2] Out: 750 [Urine:750]  PHYSICAL EXAMINATION: General:  Chronically ill-looking Neuro: AAO X3, moves all extremities HEENT: PERRLA Cardiovascular: RRR, S1/S2 Lungs: Bilateral breath sounds, no wheezes or rhonchi Abdomen:  Non-distended, normal bowel sounds Musculoskeletal: No joint deformities Skin:  Warm and dry  LABS:  BMET  Recent Labs Lab 06/11/2015 0757 06/20/15 0422  NA 138 134*  K 4.1 5.1  CL 102 99*  CO2 22 26  BUN 13 23*  CREATININE 1.14 1.19  GLUCOSE 201* 164*    Electrolytes  Recent Labs Lab 06/01/2015 0757 06/20/15 0422  CALCIUM 9.0 9.2    CBC  Recent Labs Lab 06/08/2015 0757 06/20/15 0422  WBC 16.1* 12.6*  HGB 11.1* 10.4*  HCT 33.9* 31.0*  PLT 310 276    Coag's  Recent Labs Lab 06/17/2015 1036  APTT 27  INR 1.15    Sepsis Markers  Recent Labs Lab 06/10/2015 0758 06/09/2015 1036  LATICACIDVEN 6.6* 7.8*  PROCALCITON  --  <0.10    ABG  Recent Labs Lab 06/20/15 0759  PHART 7.42  PCO2ART 29*  PO2ART 58*    Liver Enzymes  Recent Labs Lab 06/22/2015 0757  AST 43*  ALT 18  ALKPHOS 45  BILITOT 0.5  ALBUMIN 4.0    Cardiac Enzymes  Recent Labs Lab 05/30/2015 1213 06/15/2015 1824 06/20/15 0117  TROPONINI 1.94* 3.30* 4.51*    Glucose No results for input(s): GLUCAP in the last 168 hours.  Imaging Dg Chest Port 1 View  06/20/2015  CLINICAL DATA:  Evaluate line placement. EXAM: PORTABLE CHEST 1 VIEW COMPARISON:  Chest x-ray from earlier today FINDINGS: Increasing focal infiltrate in the medial right lung base is seen. Developing pneumonia is not excluded. Recommend clinical correlation. There may also be increasing opacity in left retrocardiac region. No overt edema. The cardiomediastinal silhouette is stable. The new right PICC line terminates in the central  SVC. IMPRESSION: 1. The right PICC line is in good position. 2. Increasing focal opacity in the medial right lung base and possibly the left retrocardiac region. Recommend clinical correlation and follow-up to resolution. Electronically Signed   By: Gerome Sam III M.D   On: 06/20/2015 10:57   Dg Chest Port 1 View  06/20/2015  CLINICAL DATA:  Shortness of breath. EXAM: PORTABLE CHEST 1 VIEW COMPARISON:  June 19, 2015 FINDINGS: No pneumothorax. Stable cardiomegaly. The hila and mediastinum are unchanged. Mild increased interstitial prominence consistent mild edema. More focal opacity in medial right lung base is more prominent in the interval. Recommend follow-up to resolution. IMPRESSION: Persistent mild edema. Opacity in the medial right lung base is more focal in the interval. Recommend attention on follow-up. Electronically Signed   By: Gerome Sam III M.D   On: 06/20/2015 09:34     STUDIES:  2- D echo: Severely dilated LV with an EF of 15%, Severe AS, moderate TV regurge  CULTURES: 06/18/2015 Blood> Urine> MRSA screen: negative  ANTIBIOTICS: none  SIGNIFICANT EVENTS: 02/24>Admitted with Acute on  chronic systolic heart failure and acute hypoxic respiratory failure  LINES/TUBES: PIVs  DISCUSSION: 82 yo male with acute hypoxic respiratory failure, hypotension, acute on chronic systolic heart failure, severe AS and NSTEMI. Overall prognosis poor; not a candidate for surgical intervention due to low EF of 15% and severe LV dysfunction.   ASSESSMENT / PLAN:  PULMONARY A: Acute hypoxic respiratory failure Acute COPD exacerbation  P:   Supplemental O2 by high flow [place] to keep SPO2 88-94% BiPAP prn Bronchodilator nebs prn IV steroids DNI  CARDIOVASCULAR A:  Acute on chronic systolic heart failure/LV dysfunction with EF of 15% NSTEMI CAD s/p CABG/PCI Severe AS H/o Hypertension-Now hypotensive P:  Cardiology following; appreciate recs Per cardiology, patient is not  a candidate for surgical intervention Gentle diuresis Heparin gtte per cardiology ASA Low dose metoprolol  RENAL A:   Hyponatremia P:   Monitor BMP Monitor I/O  HEMATOLOGIC A:   No acute issues P:  DVT prophylaxis-already on full dose heparin  INFECTIOUS A:   Leukocytosis-WBC trending down, no fever P:  PCT negative>no antibiotics  F/U cultures  ENDOCRINE A:   No acute issues P:   Monitor blood glucose with BMP  NEUROLOGIC A:   No acute issues P:   RASS goal:  Monitor mental status   FAMILY  - Updates: Family updated by Dr. Sung Amabile. Prognosis, goals of care and code status reviewed. Patient is made DNR   PCCM ATTENDING ATTESTATION: I have evaluated patient with ANP Luci Bank, reviewed database in its entirety and discussed care plan in detail. In addition, this patient was discussed on multidisciplinary rounds.   Important exam findings: Moderate tachypnea on HFNC O2 Refractory hypoxemia - SpO2 86-90% Poor tolerance to BiPAP due to discomfort bibasilar crackles and bronchial BS in R base c/w effusion  Major problems addressed by PCCM team: Acute hypoxic respiratory failure Pulmonary edema Doubt PNA End stage cardiomyopathy with EF 15% and severe AS Elevated trop I - NSTEMI AKI  PLAN/REC: Continue HFNC O2 <> BiPAP as tolerated If dyspnea becomes unacceptably severe, recommend low dose PRN morphine  Discussed goals of care with pt. He has executed a Living Will and based on our discussion is leaning toward DNR/DNI on the basis of poor prognosis for a functional recovery if he were to undergo intubation or ACLS.   Discussed with Dr Clint Guy and Dr Wilkie Aye, MD PCCM service Mobile [phone] Pager [phone]     06/20/2015, 2:21 PM

## 2015-06-20 NOTE — Progress Notes (Signed)
ANTICOAGULATION CONSULT NOTE - Initial Consult  Pharmacy Consult for heparin drip dosing and monitoring Indication: chest pain/ACS  Allergies  Allergen Reactions  . Benazepril Hcl Rash  . Fluvastatin Sodium Rash  . Mevacor [Lovastatin] Rash    Patient Measurements: Height:  (170.2 cm) Weight: 158 lb 4.6 oz (71.8 kg) IBW/kg (Calculated) : 66.1 Heparin Dosing Weight: 70.3 kg  Vital Signs: Temp: 97.4 F (36.3 C) (02/25 0809) Temp Source: Axillary (02/25 0809) BP: 83/63 mmHg (02/25 1200) Pulse Rate: 98 (02/25 1200)  Labs:  Recent Labs  06-25-15 0757 06/25/15 1036 06/25/2015 1213 06/25/15 1824 06/20/15 0117 06/20/15 0422 06/20/15 1232  HGB 11.1*  --   --   --   --  10.4*  --   HCT 33.9*  --   --   --   --  31.0*  --   PLT 310  --   --   --   --  276  --   APTT  --  27  --   --   --   --   --   LABPROT  --  14.9  --   --   --   --   --   INR  --  1.15  --   --   --   --   --   HEPARINUNFRC  --   --   --  0.24*  --  0.49 0.54  CREATININE 1.14  --   --   --   --  1.19  --   TROPONINI 1.36*  --  1.94* 3.30* 4.51*  --   --     Estimated Creatinine Clearance: 44.7 mL/min (by C-G formula based on Cr of 1.19).   Medical History: Past Medical History  Diagnosis Date  . Myocardial infarct [facility]) 04-23-13    CAD on cardiac cath, no PCI or CABG  . History of chicken pox   . History of measles   . History of mumps   . CHF (congestive heart failure) (HCC)     systolic dysfunction, EF 40-45%  . C. difficile colitis 12/02/2014    following prolonged antibiotic treatment for post op pneumonia   . AAA (abdominal aortic aneurysm) (HCC)   . Carotid stenosis   . HTN (hypertension)   . Hyperlipidemia   . Severe aortic stenosis   . COPD (chronic obstructive pulmonary disease) (HCC)   . Atrial fibrillation [facility])    Assessment: Pharmacy consulted to dose and monitor heparin drip in this 82 year old male presenting with SOB and elevated troponin of 1.36. Patient has cardiac  history including MI, HF, AAA, atrial fibrillation, and severe aortic stenosis. Patient not taking anticoagulants prior to admission per med rec and recent outpatient notes.  Baseline labs: Hgb: 11.1 Plt 310  APTT & INR ordered STAT  Goal of Therapy:  Heparin level 0.3-0.7 units/ml Monitor platelets by anticoagulation protocol: Yes   Plan:  Give 3500 units bolus x 1 (50 units/kg) Start heparin infusion at 850 units/hr (~12 units/kg/hr) Check anti-Xa level in 8 hours and daily while on heparin Continue to monitor H&H and platelets  2/24:  HL @ 18:24 = 0.24 Will order Heparin 1050 units IV X 1 bolus and increase drip rate to 1100 units/hr. Will recheck HL 8 hrs after rate change on 2/25 @ 0400.   02/25 04:00 heparin level 0.49. Continue current regimen and recheck in 8 hours to confirm.  02/25 1200 HL 0.54. Continue current regimen and check  daily.   Cy Blamer 06/20/2015,1:04 PM

## 2015-06-20 NOTE — Progress Notes (Signed)
PT Cancellation Note  Patient Details Name: Calvin Carpenter MRN: [id number] DOB: February 19, 1931   Cancelled Treatment:    Reason Eval/Treat Not Completed: Medical issues which prohibited therapy (Per continued chart review, patient continues with significant respiratory distress (currently on BiPAP); also noted with persistent hypotension and troponins trending upward (peak at 4.3 at this time).  Will continue hold at this time and continue efforts as medically appropriate.)   Sharla Tankard H. Manson Passey, PT, DPT, NCS 06/20/2015, 8:41 AM [phone]

## 2015-06-20 NOTE — Progress Notes (Signed)
Notified Dr. Bard Herbert that patient looks very pale and has increased work of breathing- he does not look well.  Patient on High flow at 95% Fi02 and sating 88%.  Dr. Glenna Fellows aware and spoke to patient- plan for family to meet in the am to discuss code status.  Dr. Bard Herbert stated to keep patient on High flow at this time- though he may sat in the mid 80's- but if patient becomes in more respiratory distress- to place patient back on bipap.

## 2015-06-20 NOTE — Progress Notes (Signed)
Notified [place] [facility] for PICC placement.

## 2015-06-20 NOTE — Progress Notes (Signed)
Notified Dr. Clint Guy about patient's BP staying in mid 70's systolic- BP at this time 76/62- though it remains at 68.  Patient having labored breathing and stating that he can not breath- sating 93% at 45% fi02.  MD stated to order stat ABG and he will be over shortly.

## 2015-06-21 ENCOUNTER — Inpatient Hospital Stay: Payer: PPO

## 2015-06-21 DIAGNOSIS — J810 Acute pulmonary edema: Secondary | ICD-10-CM

## 2015-06-21 DIAGNOSIS — Z7189 Other specified counseling: Secondary | ICD-10-CM

## 2015-06-21 LAB — CBC
HCT: 31.2 % — ABNORMAL LOW (ref 40.0–52.0)
HEMOGLOBIN: 10.3 g/dL — AB (ref 13.0–18.0)
MCH: 30.4 pg (ref 26.0–34.0)
MCHC: 33 g/dL (ref 32.0–36.0)
MCV: 92.4 fL (ref 80.0–100.0)
PLATELETS: 299 10*3/uL (ref 150–440)
RBC: 3.38 MIL/uL — ABNORMAL LOW (ref 4.40–5.90)
RDW: 14.7 % — AB (ref 11.5–14.5)
WBC: 14.4 10*3/uL — ABNORMAL HIGH (ref 3.8–10.6)

## 2015-06-21 LAB — MAGNESIUM: MAGNESIUM: 2.5 mg/dL — AB (ref 1.7–2.4)

## 2015-06-21 LAB — BASIC METABOLIC PANEL
Anion gap: 13 (ref 5–15)
BUN: 39 mg/dL — AB (ref 6–20)
CALCIUM: 9.4 mg/dL (ref 8.9–10.3)
CO2: 22 mmol/L (ref 22–32)
CREATININE: 1.14 mg/dL (ref 0.61–1.24)
Chloride: 101 mmol/L (ref 101–111)
GFR, EST NON AFRICAN AMERICAN: 58 mL/min — AB (ref >60)
Glucose, Bld: 185 mg/dL — ABNORMAL HIGH (ref 65–99)
Potassium: 4.3 mmol/L (ref 3.5–5.1)
SODIUM: 136 mmol/L (ref 135–145)

## 2015-06-21 LAB — PHOSPHORUS: PHOSPHORUS: 4.2 mg/dL (ref 2.5–4.6)

## 2015-06-21 LAB — PROCALCITONIN: Procalcitonin: 0.45 ng/mL

## 2015-06-21 LAB — HEPARIN LEVEL (UNFRACTIONATED): HEPARIN UNFRACTIONATED: 0.5 [IU]/mL (ref 0.30–0.70)

## 2015-06-21 MED ORDER — MORPHINE SULFATE (PF) 2 MG/ML IV SOLN
1.0000 mg | INTRAVENOUS | Status: DC | PRN
  Administered 2015-06-21 – 2015-06-22 (×6): 2 mg via INTRAVENOUS
  Filled 2015-06-21 (×6): qty 1

## 2015-06-21 MED ORDER — MORPHINE SULFATE (PF) 2 MG/ML IV SOLN
INTRAVENOUS | Status: AC
  Administered 2015-06-21: 2 mg via INTRAVENOUS
  Filled 2015-06-21: qty 1

## 2015-06-21 MED ORDER — FUROSEMIDE 40 MG PO TABS
40.0000 mg | ORAL_TABLET | Freq: Every day | ORAL | Status: DC
  Administered 2015-06-21 – 2015-06-22 (×2): 40 mg via ORAL
  Filled 2015-06-21 (×2): qty 1

## 2015-06-21 MED ORDER — MORPHINE SULFATE (PF) 2 MG/ML IV SOLN
2.0000 mg | INTRAVENOUS | Status: DC | PRN
  Administered 2015-06-21: 2 mg via INTRAVENOUS

## 2015-06-21 NOTE — Progress Notes (Signed)
ANTICOAGULATION CONSULT NOTE - Initial Consult  Pharmacy Consult for heparin drip dosing and monitoring Indication: chest pain/ACS  Allergies  Allergen Reactions  . Benazepril Hcl Rash  . Fluvastatin Sodium Rash  . Mevacor [Lovastatin] Rash    Patient Measurements: Height:  (170.2 cm) Weight: 152 lb 8.9 oz (69.2 kg) IBW/kg (Calculated) : 66.1 Heparin Dosing Weight: 70.3 kg  Vital Signs: BP: 111/69 mmHg (02/26 0700) Pulse Rate: 100 (02/26 0700)  Labs:  Recent Labs  06/15/2015 0757 06/03/2015 1036 06/04/2015 1213  06/11/2015 1824 06/20/15 0117 06/20/15 0422 06/20/15 1232 06/21/15 0444  HGB 11.1*  --   --   --   --   --  10.4*  --  10.3*  HCT 33.9*  --   --   --   --   --  31.0*  --  31.2*  PLT 310  --   --   --   --   --  276  --  299  APTT  --  27  --   --   --   --   --   --   --   LABPROT  --  14.9  --   --   --   --   --   --   --   INR  --  1.15  --   --   --   --   --   --   --   HEPARINUNFRC  --   --   --   < > 0.24*  --  0.49 0.54 0.50  CREATININE 1.14  --   --   --   --   --  1.19  --  1.14  TROPONINI 1.36*  --  1.94*  --  3.30* 4.51*  --   --   --   < > = values in this interval not displayed.  Estimated Creatinine Clearance: 46.7 mL/min (by C-G formula based on Cr of 1.14).   Medical History: Past Medical History  Diagnosis Date  . Myocardial infarct [facility]) 04-23-13    CAD on cardiac cath, no PCI or CABG  . History of chicken pox   . History of measles   . History of mumps   . CHF (congestive heart failure) (HCC)     systolic dysfunction, EF 40-45%  . C. difficile colitis 12/02/2014    following prolonged antibiotic treatment for post op pneumonia   . AAA (abdominal aortic aneurysm) (HCC)   . Carotid stenosis   . HTN (hypertension)   . Hyperlipidemia   . Severe aortic stenosis   . COPD (chronic obstructive pulmonary disease) (HCC)   . Atrial fibrillation [facility])    Assessment: Pharmacy consulted to dose and monitor heparin drip in this 82 year old  male presenting with SOB and elevated troponin of 1.36. Patient has cardiac history including MI, HF, AAA, atrial fibrillation, and severe aortic stenosis. Patient not taking anticoagulants prior to admission per med rec and recent outpatient notes.  Goal of Therapy:  Heparin level 0.3-0.7 units/ml Monitor platelets by anticoagulation protocol: Yes   Plan:  Heparin level remains at goal so will continue heparin infusion at 1100 units/hr and recheck a HL and CBC with AM labs.   Luisa Hart D 06/21/2015,7:49 AM

## 2015-06-21 NOTE — Progress Notes (Signed)
PULMONARY / CRITICAL CARE MEDICINE   Name: Calvin Carpenter MRN: [id number] DOB: 1930-08-05    ADMISSION DATE:  06/18/2015   CONSULTATION DATE:  06/20/15  REFERRING MD:  Dr. Clint Guy  CHIEF COMPLAINT:  Acute respiratory failure  HISTORY OF PRESENT ILLNESS:   This is an 82 yo male with a PMH of systolic heart failure, CAD s/p MI/PCI/CABG, AAA, hypertension, AS, afib and COPD who presented with shortness of breath and shortness of breath with exertion x 2 days and intermittent chest pain. He was started on continuous BiPAP for severe respiratory distress and hypoxia. He was admitted to the ICU and CCM was consulted to assist with patient's management. Patient is now on High flow [place] and SPO2 is between 88-92%. Still hypotensive with systolic BP in the low 80s   SUBJECTIVE:  Less dyspneic. Still requiring HFNC O2. No new complaints  VITAL SIGNS: BP 103/61 mmHg  Pulse 101  Temp(Src) 97.5 F (36.4 C) (Axillary)  Resp 20  Ht  (1.702 m)  Wt 152 lb 8.9 oz (69.2 kg)  BMI 23.89 kg/m2  SpO2 98%  HEMODYNAMICS:    VENTILATOR SETTINGS: Vent Mode:  [-]  FiO2 (%):  [85 %-100 %] 85 %  INTAKE / OUTPUT: I/O last 3 completed shifts: In: 402.1 [I.V.:402.1] Out: 1225 [Urine:1225]  PHYSICAL EXAMINATION: General:  NAD, cognition intact Neuro: no focal deficits HEENT: NCAT Cardiovascular: reg, + syst M Lungs: Bilateral basilar crackles, no wheezes Abdomen: Soft, NT, +BS Ext: cool, trace symmetric edema  LABS:  BMET  Recent Labs Lab 06/14/2015 0757 06/20/15 0422 06/21/15 0444  NA 138 134* 136  K 4.1 5.1 4.3  CL 102 99* 101  CO2 BUN 13 23* 39*  CREATININE 1.14 1.19 1.14  GLUCOSE 201* 164* 185*    Electrolytes  Recent Labs Lab 06/12/2015 0757 06/20/15 0422 06/21/15 0444  CALCIUM 9.0 9.2 9.4  MG  --   --  2.5*  PHOS  --   --  4.2    CBC  Recent Labs Lab 05/31/2015 0757 06/20/15 0422 06/21/15 0444  WBC 16.1* 12.6* 14.4*  HGB 11.1* 10.4* 10.3*  HCT 33.9*  31.0* 31.2*  PLT 310 276 299    Coag's  Recent Labs Lab 06/17/2015 1036  APTT 27  INR 1.15    Sepsis Markers  Recent Labs Lab 05/30/2015 0758 06/15/2015 1036 06/21/15 0444  LATICACIDVEN 6.6* 7.8*  --   PROCALCITON  --  <0.10 0.45    ABG  Recent Labs Lab 06/20/15 0759  PHART 7.42  PCO2ART 29*  PO2ART 58*    Liver Enzymes  Recent Labs Lab 06/15/2015 0757  AST 43*  ALT 18  ALKPHOS 45  BILITOT 0.5  ALBUMIN 4.0    Cardiac Enzymes  Recent Labs Lab 06/07/2015 1213 06/11/2015 1824 06/20/15 0117  TROPONINI 1.94* 3.30* 4.51*    Glucose No results for input(s): GLUCAP in the last 168 hours.  CXR: interstitial edema and small bilateral pleural effusions   ASSESSMENT / PLAN: Acute hypoxic respiratory failure This is not COPD Doubt PNA Acute on chronic systolic heart failure Severe dilated CM Severe aortic stenosis NSTEMI Very poor prognosis  - Cont supplemental O2 - He is on nebulized bronchodilators which I don't object to but I suspect they are adding little benefit - DC systemic steroids and antibiotics -I again addressed goals of care with pt and multiple family members. I advised against intubation in the event of worsening respiratory failure as this would  be a futile endeavor given the severe/end stage nature of his cardiomyopathy. He has executed a Living Will recently and concurs with this decision - We discussed expected course and I clarified that we cannot know for certain how much improvement is possible.  - We discussed possible discharge options and his daughter has asked that we investigate the possibility of residential Hospice - Once we are certain that he can receive the necessary care to meet our goals of comfort, he may be discharged to regular floor  PCCM will sign off. Please call if we can be of further assistance  Billy Fischer, MD PCCM service Mobile [phone] Pager [phone]   06/21/2015, 1:08 PM

## 2015-06-21 NOTE — Progress Notes (Signed)
Resting at this time on bipap. Decreased work of breathing since morphine was given. Continuing to monitor.

## 2015-06-21 NOTE — Progress Notes (Signed)
PT Cancellation Note  Patient Details Name: Calvin Carpenter MRN: [id number] DOB: 1931-03-31   Cancelled Treatment:    Reason Eval/Treat Not Completed: Medical issues which prohibited therapy MD put in order to discontinue PT.  Spoke with nurse and she reports pt has been having a very hard time with breathing. Will complete orders, will need new orders if pt status changes and he does need PT intervention.   Loran Senters, PT, DPT [phone]  Malachi Pro 06/21/2015, 4:36 PM

## 2015-06-21 NOTE — Progress Notes (Signed)
RN spoke with Dr. Sung Amabile in person and asked MD about ordering morphine more frequently for dyspnea. Morphine currently ordered  q4H PRN. MD gave order for morphine IV 1-2 mg q1H PRN pain and or dyspnea.

## 2015-06-21 NOTE — Progress Notes (Signed)
[facility] Encounter Note  Patient: Calvin Carpenter / Admit Date: 06/22/15 / Date of Encounter: 06/21/2015, 6:52 AM   Subjective: Patient is much improved since yesterday with less shortness of breath and pulmonary Rales and/or crackles. Hypoxia is improved  Review of Systems: Positive for: Shortness of breath weakness and fatigue Negative for: Vision change, hearing change, syncope, dizziness, nausea, vomiting,diarrhea, bloody stool, stomach pain, cough, congestion, diaphoresis, urinary frequency, urinary pain,skin lesions, skin rashes Others previously listed  Objective: Telemetry: Sinus tachycardia with bundle branch block Physical Exam: Blood pressure 115/66, pulse 88, temperature 97.7 F (36.5 C), temperature source Oral, resp. rate 17, height  (1.702 m), weight 152 lb 8.9 oz (69.2 kg), SpO2 100 %. Body mass index is 23.89 kg/(m^2). General: Well developed, well nourished, in no acute distress. Head: Normocephalic, atraumatic, sclera non-icteric, no xanthomas, nares are without discharge. Neck: No apparent masses Lungs: Normal respirations with no wheezes, no rhonchi, no rales , basilar crackles   Heart: Regular rate and rhythm, normal S1 soft S2, 2+ aortic murmur, no rub, no gallop, PMI is normal size and placement, carotid upstroke normal with bruit, jugular venous pressure normal Abdomen: Soft, non-tender, non-distended with normoactive bowel sounds. No hepatosplenomegaly. Abdominal aorta is normal size without bruit Extremities: Trace edema, no clubbing, no cyanosis, no ulcers,  Peripheral: 2+ radial, 2+ femoral, 2+ dorsal pedal pulses Neuro: Alert and oriented. Moves all extremities spontaneously. Psych:  Responds to questions appropriately with a normal affect.   Intake/Output Summary (Last 24 hours) at 06/21/15 0981 Last data filed at 06/21/15 0600  Gross per 24 hour  Intake    264 ml  Output    725 ml  Net   -461 ml    Inpatient  Medications:  . antiseptic oral rinse  7 mL Mouth Rinse q12n4p  . aspirin EC  81 mg Oral BH-q7a  . chlorhexidine  15 mL Mouth Rinse BID  . furosemide  40 mg Intravenous Daily  . heparin  3,500 Units Intravenous Once  . ipratropium-albuterol  3 mL Nebulization Q6H  . lisinopril  20 mg Oral Daily  . meloxicam  15 mg Oral Daily  . methylPREDNISolone (SOLU-MEDROL) injection  60 mg Intravenous Q12H  . metoprolol succinate  25 mg Oral Daily  . mometasone-formoterol  2 puff Inhalation BID  . montelukast  10 mg Oral QHS  . simvastatin  40 mg Oral Daily  . sodium chloride flush  3 mL Intravenous Q12H  . verapamil  180 mg Oral QHS   Infusions:  . heparin 1,100 Units/hr (06/20/15 1900)    Labs:  Recent Labs  06/20/15 0422 06/21/15 0444  NA 134* 136  K 5.1 4.3  CL 99* 101  CO2 26 22  GLUCOSE 164* 185*  BUN 23* 39*  CREATININE 1.19 1.14  CALCIUM 9.2 9.4  MG  --  2.5*  PHOS  --  4.2    Recent Labs  Jun 22, 2015 0757  AST 43*  ALT 18  ALKPHOS 45  BILITOT 0.5  PROT 7.3  ALBUMIN 4.0    Recent Labs  06-22-15 0757 06/20/15 0422 06/21/15 0444  WBC 16.1* 12.6* 14.4*  NEUTROABS 11.9*  --   --   HGB 11.1* 10.4* 10.3*  HCT 33.9* 31.0* 31.2*  MCV 94.9 91.8 92.4  PLT 310 276 299    Recent Labs  June 22, 2015 0757 June 22, 2015 1213 06-22-2015 1824 06/20/15 0117  TROPONINI 1.36* 1.94* 3.30* 4.51*   Invalid input(s): POCBNP No results for input(s): HGBA1C  in the last 72 hours.   Weights: Filed Weights   Jun 27, 2015 1130 06/20/15 0411 06/21/15 0500  Weight: 157 lb 6.5 oz (71.4 kg) 158 lb 4.6 oz (71.8 kg) 152 lb 8.9 oz (69.2 kg)     Radiology/Studies:  Dg Chest 2 View  05/29/2015  CLINICAL DATA:  Follow-up of pneumonia ; history of COPD and cardiomyopathy and previous tobacco use. EXAM: CHEST  2 VIEW COMPARISON:  CT scan of the chest and chest x-ray of May 23, 2015 FINDINGS: The lungs are mildly hyperinflated. A trace of pleural fluid persists on the left. The bibasilar  infiltrates have resolved. The heart and pulmonary vascularity are normal. The mediastinum is normal in width. There is stable tortuosity of the descending thoracic aorta. The bony thorax exhibits no acute abnormality. IMPRESSION: COPD. Interval clearing of the bibasilar infiltrates. A trace of pleural fluid persists on the left. Electronically Signed   By: David  [place] M.D.   On: 05/29/2015 11:33   Ct Angio Chest Pe W/cm &/or Wo Cm  05/23/2015  CLINICAL DATA:  Difficulty breathing. EXAM: CT ANGIOGRAPHY CHEST WITH CONTRAST TECHNIQUE: Multidetector CT imaging of the chest was performed using the standard protocol during bolus administration of intravenous contrast. Multiplanar CT image reconstructions and MIPs were obtained to evaluate the vascular anatomy. CONTRAST:  75mL OMNIPAQUE IOHEXOL 350 MG/ML SOLN COMPARISON:  04/25/2013. FINDINGS: Mediastinum / Lymph Nodes: There is no axillary lymphadenopathy. Scattered small lymph nodes in the mediastinum do not meet CT criteria for pathologic enlargement. No evidence for mediastinal lymphadenopathy. The heart is upper normal for size. No pericardial effusion. The esophagus has normal imaging features. No filling defect within the opacified pulmonary arteries to suggest the presence of an acute pulmonary embolus. No evidence for dissection flap in the thoracic aorta. Lungs / Pleura: Moderate changes of centrilobular emphysema noted bilaterally. Bronchial wall thickening is seen diffusely in both lungs. There is bilateral dependent collapse/ consolidation in the lower lobes. Small bilateral pleural effusions are noted. Upper Abdomen:  Unremarkable. MSK / Soft Tissues: Bone windows reveal no worrisome lytic or sclerotic osseous lesions. Review of the MIP images confirms the above findings. IMPRESSION: 1. No CT evidence for acute pulmonary embolus. 2. Bilateral lower lobe collapse/consolidation with small bilateral pleural effusions. 3. Moderate changes of centrilobular  emphysema. Electronically Signed   By: Kennith Center M.D.   On: 05/23/2015 18:46   Dg Chest Port 1 View  06/20/2015  CLINICAL DATA:  Evaluate line placement. EXAM: PORTABLE CHEST 1 VIEW COMPARISON:  Chest x-ray from earlier today FINDINGS: Increasing focal infiltrate in the medial right lung base is seen. Developing pneumonia is not excluded. Recommend clinical correlation. There may also be increasing opacity in left retrocardiac region. No overt edema. The cardiomediastinal silhouette is stable. The new right PICC line terminates in the central SVC. IMPRESSION: 1. The right PICC line is in good position. 2. Increasing focal opacity in the medial right lung base and possibly the left retrocardiac region. Recommend clinical correlation and follow-up to resolution. Electronically Signed   By: Gerome Sam III M.D   On: 06/20/2015 10:57   Dg Chest Port 1 View  06/20/2015  CLINICAL DATA:  Shortness of breath. EXAM: PORTABLE CHEST 1 VIEW COMPARISON:  June 27, 2015 FINDINGS: No pneumothorax. Stable cardiomegaly. The hila and mediastinum are unchanged. Mild increased interstitial prominence consistent mild edema. More focal opacity in medial right lung base is more prominent in the interval. Recommend follow-up to resolution. IMPRESSION: Persistent mild edema. Opacity in  the medial right lung base is more focal in the interval. Recommend attention on follow-up. Electronically Signed   By: Gerome Sam III M.D   On: 06/20/2015 09:34   Dg Chest Portable 1 View  06-21-2015  CLINICAL DATA:  Shortness of breath with cough and chest pain EXAM: PORTABLE CHEST 1 VIEW COMPARISON:  May 29, 2015 FINDINGS: Underlying changes of COPD again noted. There is currently interstitial edema. Heart is mildly enlarged. There is mild pulmonary venous hypertension. No airspace consolidation. No adenopathy. There is degenerative change in each shoulder. IMPRESSION: Findings which peer consistent with congestive heart  failure superimposed on COPD. No airspace consolidation. Electronically Signed   By: Bretta Bang III M.D.   On: 06/21/15 08:07   Dg Chest Portable 1 View  05/23/2015  CLINICAL DATA:  Shortness of Breath EXAM: PORTABLE CHEST 1 VIEW COMPARISON:  04/22/2015 FINDINGS: Cardiac shadow is at the upper limits of normal in size. Aortic calcifications are noted. Bilateral basilar infiltrates are noted. No sizable effusion is seen. No bony abnormality is noted. IMPRESSION: Bibasilar infiltrates. Electronically Signed   By: Alcide Clever M.D.   On: 05/23/2015 10:59     Assessment and Recommendation  82 y.o. male with acute non-ST elevation myocardial infarction with acute on chronic systolic dysfunction multifactorial in nature including coronary artery disease diffuse in nature previously assessed by cardiac catheterization showing not to be amenable for further intervention as well as severe aortic valve stenosis not amenable for surgical intervention and severe LV systolic dysfunction by recent echocardiogram showing ejection fraction of 15% 1. Continue gentle diuresis although would decrease dose at this time due to concerns of patient being dehydrated and hypotensive and possible change to oral medications at this time 2. Patient appears to have the possible poor output cardiomyopathy and congestive heart failure which may need continued introduction of low-dose beta blocker for heart rate control. Patient should have increase of beta blocker back to 25 mg if able 3. Continue aspirin for non-ST elevation myocardial infarction 4. No current ability for further aggressive intervention likely due to severe aortic stenosis and severe LV systolic dysfunction with very high risk of complication and death with the possibility of this intervention. Patient has had this explained to him for which showed the patient does have a living will and understands prognosis 5. Begin ambulation if able and possible transfer  to telemetry if able  Signed, Arnoldo Hooker M.D. FACC

## 2015-06-21 NOTE — Progress Notes (Signed)
[facility] [place] at [facility]   PATIENT NAME: Calvin Carpenter    MR#:  [id number]  DATE OF BIRTH:  1931-01-02  SUBJECTIVE:  Remains on BiPAP intensive care unit Does complain of some shortness of breath   REVIEW OF SYSTEMS:  CONSTITUTIONAL: No fever, fatigue or weakness.  EYES: No blurred or double vision.  EARS, NOSE, AND THROAT: No tinnitus or ear pain.  RESPIRATORY: No cough, positive shortness of breath, wheezing denies hemoptysis.  CARDIOVASCULAR: No chest pain, orthopnea, edema.  GASTROINTESTINAL: No nausea, vomiting, diarrhea or abdominal pain.  GENITOURINARY: No dysuria, hematuria.  ENDOCRINE: No polyuria, nocturia,  HEMATOLOGY: No anemia, easy bruising or bleeding SKIN: No rash or lesion. MUSCULOSKELETAL: No joint pain or arthritis.   NEUROLOGIC: No tingling, numbness, weakness.  PSYCHIATRY: No anxiety or depression.   DRUG ALLERGIES:   Allergies  Allergen Reactions  . Benazepril Hcl Rash  . Fluvastatin Sodium Rash  . Mevacor [Lovastatin] Rash    VITALS:  Blood pressure 105/61, pulse 94, temperature 97.5 F (36.4 C), temperature source Axillary, resp. rate 15, height  (1.702 m), weight 69.2 kg (152 lb 8.9 oz), SpO2 97 %.  PHYSICAL EXAMINATION:  VITAL SIGNS: Filed Vitals:   06/21/15 1300 06/21/15 1400  BP: 100/61 105/61  Pulse: 102 94  Temp:    Resp: 22 15   GENERAL:82 y.o.male currently critically ill  HEAD: Normocephalic, atraumatic.  EYES: Pupils equal, round, reactive to light. Extraocular muscles intact. No scleral icterus.  MOUTH: Moist mucosal membrane. Dentition intact. No abscess noted.  EAR, NOSE, THROAT: Clear without exudates. No external lesions.  NECK: Supple. No thyromegaly. No nodules. No JVD.  PULMONARY: Diminished breath sounds basilar rhonchi expiratory wheeze. No use of accessory muscles, poor respiratory effort. good air entry bilaterally on BiPAP CHEST: Nontender to palpation.  CARDIOVASCULAR: S1  and S2. Regular rate and rhythm. No murmurs, rubs, or gallops. No edema. Pedal pulses 2+ bilaterally.  GASTROINTESTINAL: Soft, nontender, nondistended. No masses. Positive bowel sounds. No hepatosplenomegaly.  MUSCULOSKELETAL: No swelling, clubbing, or edema. Range of motion full in all extremities.  NEUROLOGIC: Cranial nerves II through XII are intact. No gross focal neurological deficits. Sensation intact. Reflexes intact.  SKIN: No ulceration, lesions, rashes, or cyanosis. Skin warm and dry. Turgor intact.  PSYCHIATRIC: Mood, affect within normal limits. The patient is awake, alert and oriented x 3. Insight, judgment intact.      LABORATORY PANEL:   CBC  Recent Labs Lab 06/21/15 0444  WBC 14.4*  HGB 10.3*  HCT 31.2*  PLT 299   ------------------------------------------------------------------------------------------------------------------  Chemistries   Recent Labs Lab June 24, 2015 0757  06/21/15 0444  NA 138  < > 136  K 4.1  < > 4.3  CL 102  < > 101  CO2 22  < > 22  GLUCOSE 201*  < > 185*  BUN 13  < > 39*  CREATININE 1.14  < > 1.14  CALCIUM 9.0  < > 9.4  MG  --   --  2.5*  AST 43*  --   --   ALT 18  --   --   ALKPHOS 45  --   --   BILITOT 0.5  --   --   < > = values in this interval not displayed. ------------------------------------------------------------------------------------------------------------------  Cardiac Enzymes  Recent Labs Lab 06/20/15 0117  TROPONINI 4.51*   ------------------------------------------------------------------------------------------------------------------  RADIOLOG[facility]:  [facility]  [facility] DATA:  Acute respiratory failure. EXAM: PORTABLE  CHEST 1 VIEW COMPARISON:  06/20/2015 FINDINGS: There is cardiomegaly with vascular congestion, interstitial prominence and bilateral lower lobe airspace opacities. Small layering effusions. No significant change since prior study. Right PICC line is unchanged. IMPRESSION:  Bilateral lower lobe opacities and interstitial prominence, likely reflecting mild edema. Small layering effusions. Electronically Signed   By: Charlett Nose M.D.   On: 06/21/2015 07:33   Dg Chest Port 1 View  06/20/2015  CLINICAL DATA:  Evaluate line placement. EXAM: PORTABLE CHEST 1 VIEW COMPARISON:  Chest x-ray from earlier today FINDINGS: Increasing focal infiltrate in the medial right lung base is seen. Developing pneumonia is not excluded. Recommend clinical correlation. There may also be increasing opacity in left retrocardiac region. No overt edema. The cardiomediastinal silhouette is stable. The new right PICC line terminates in the central SVC. IMPRESSION: 1. The right PICC line is in good position. 2. Increasing focal opacity in the medial right lung base and possibly the left retrocardiac region. Recommend clinical correlation and follow-up to resolution. Electronically Signed   By: Gerome Sam III M.D   On: 06/20/2015 10:57   Dg Chest Port 1 View  06/20/2015  CLINICAL DATA:  Shortness of breath. EXAM: PORTABLE CHEST 1 VIEW COMPARISON:  07/17/15 FINDINGS: No pneumothorax. Stable cardiomegaly. The hila and mediastinum are unchanged. Mild increased interstitial prominence consistent mild edema. More focal opacity in medial right lung base is more prominent in the interval. Recommend follow-up to resolution. IMPRESSION: Persistent mild edema. Opacity in the medial right lung base is more focal in the interval. Recommend attention on follow-up. Electronically Signed   By: Gerome Sam III M.D   On: 06/20/2015 09:34    EKG:   Orders placed or performed during the hospital encounter of 07/17/2015  . EKG 12-Lead  . EKG 12-Lead    ASSESSMENT AND PLAN:   82 year old Caucasian Male history of systolic congestive heart failure presenting with shortness of breath admitted Jul 17, 2015  1. Acute on chronic systolic congestive heart failure: Change Lasix to oral follow daily weight urine  output, cardiology input appreciated, DuoNeb treatments 2. COPD exacerbation: Off steroids 3. Essential hypertension hold Norvasc given relative hypotension continue beta blocker and ACE inhibitor as blood pressure allows 4. Nstemi: Continue therapeutic heparin -medical management 5. Venous thrombi was a prophylactic: Therapeutic heparin  Disposition: Discussed with family overall poor prognosis given heart and lung conditions. Now DO NOT RESUSCITATE, family considering hospice case discussed with critical care their input appreciated    All the records are reviewed and case discussed with Care Management/Social Workerr. Management plans discussed with the patient, family and they are in agreement.  CODE STATUS: dnr  TOTAL TIME TAKING CARE OF THIS PATIENT: 33 critical minutes.   POSSIBLE D/C IN 1-2 DAYS, DEPENDING ON CLINICAL CONDITION.   Heydi Swango,  Mardi Mainland.D on 06/21/2015 at 2:13 PM  Between 7am to 6pm - Pager - [phone]  After 6pm: House Pager: - [phone]  [facility]  Office  [phone]  CC: Primary care physician; Mila Merry, MD

## 2015-06-21 NOTE — Progress Notes (Signed)
Spoke with Dr. Clint Guy, patient's blood pressure 103/66 and patient is ordered lasix, metoprolol and lisinopril. MD gave order to hold metoprolol and lisinopril this shift.

## 2015-06-22 DIAGNOSIS — I509 Heart failure, unspecified: Secondary | ICD-10-CM | POA: Insufficient documentation

## 2015-06-22 DIAGNOSIS — I2510 Atherosclerotic heart disease of native coronary artery without angina pectoris: Secondary | ICD-10-CM

## 2015-06-22 DIAGNOSIS — J441 Chronic obstructive pulmonary disease with (acute) exacerbation: Secondary | ICD-10-CM

## 2015-06-22 DIAGNOSIS — J811 Chronic pulmonary edema: Secondary | ICD-10-CM

## 2015-06-22 DIAGNOSIS — J9620 Acute and chronic respiratory failure, unspecified whether with hypoxia or hypercapnia: Secondary | ICD-10-CM

## 2015-06-22 DIAGNOSIS — Z515 Encounter for palliative care: Secondary | ICD-10-CM

## 2015-06-22 DIAGNOSIS — I255 Ischemic cardiomyopathy: Secondary | ICD-10-CM

## 2015-06-22 DIAGNOSIS — I739 Peripheral vascular disease, unspecified: Secondary | ICD-10-CM

## 2015-06-22 DIAGNOSIS — I5023 Acute on chronic systolic (congestive) heart failure: Secondary | ICD-10-CM

## 2015-06-22 DIAGNOSIS — Z9981 Dependence on supplemental oxygen: Secondary | ICD-10-CM

## 2015-06-22 DIAGNOSIS — I214 Non-ST elevation (NSTEMI) myocardial infarction: Principal | ICD-10-CM

## 2015-06-22 DIAGNOSIS — I350 Nonrheumatic aortic (valve) stenosis: Secondary | ICD-10-CM

## 2015-06-22 DIAGNOSIS — I959 Hypotension, unspecified: Secondary | ICD-10-CM

## 2015-06-22 LAB — HEPARIN LEVEL (UNFRACTIONATED): HEPARIN UNFRACTIONATED: 0.4 [IU]/mL (ref 0.30–0.70)

## 2015-06-22 LAB — CBC
HEMATOCRIT: 32.4 % — AB (ref 40.0–52.0)
HEMOGLOBIN: 10.6 g/dL — AB (ref 13.0–18.0)
MCH: 30.4 pg (ref 26.0–34.0)
MCHC: 32.6 g/dL (ref 32.0–36.0)
MCV: 93.2 fL (ref 80.0–100.0)
Platelets: 353 10*3/uL (ref 150–440)
RBC: 3.48 MIL/uL — ABNORMAL LOW (ref 4.40–5.90)
RDW: 14.5 % (ref 11.5–14.5)
WBC: 15.1 10*3/uL — AB (ref 3.8–10.6)

## 2015-06-22 MED ORDER — TIOTROPIUM BROMIDE MONOHYDRATE 18 MCG IN CAPS
18.0000 ug | ORAL_CAPSULE | Freq: Every day | RESPIRATORY_TRACT | Status: DC
  Filled 2015-06-22: qty 5

## 2015-06-22 MED ORDER — MOMETASONE FURO-FORMOTEROL FUM 100-5 MCG/ACT IN AERO
2.0000 | INHALATION_SPRAY | Freq: Two times a day (BID) | RESPIRATORY_TRACT | Status: DC
  Filled 2015-06-22: qty 8.8

## 2015-06-22 MED ORDER — LISINOPRIL 20 MG PO TABS: 20.0000 mg | ORAL_TABLET | Freq: Every day | ORAL | Status: DC

## 2015-06-22 MED ORDER — DEXTROSE 5 % IV SOLN
4.0000 mg/h | INTRAVENOUS | Status: DC
  Administered 2015-06-22: 1 mg/h via INTRAVENOUS
  Filled 2015-06-22: qty 10

## 2015-06-22 MED ORDER — LORAZEPAM 0.5 MG PO TABS: 1.0000 mg | ORAL_TABLET | ORAL | Status: DC | PRN

## 2015-06-22 MED ORDER — LORAZEPAM 2 MG/ML PO CONC
1.0000 mg | ORAL | Status: DC | PRN
Start: 2015-06-22 — End: 2015-06-22

## 2015-06-22 MED ORDER — HALOPERIDOL LACTATE 5 MG/ML IJ SOLN: 0.5000 mg | INTRAMUSCULAR | Status: DC | PRN

## 2015-06-22 MED ORDER — MORPHINE SULFATE (PF) 2 MG/ML IV SOLN
2.0000 mg | Freq: Once | INTRAVENOUS | Status: AC
  Administered 2015-06-22: 2 mg via INTRAVENOUS
  Filled 2015-06-22: qty 1

## 2015-06-22 MED ORDER — ALBUTEROL SULFATE (2.5 MG/3ML) 0.083% IN NEBU: 2.5000 mg | INHALATION_SOLUTION | Freq: Four times a day (QID) | RESPIRATORY_TRACT | Status: DC | PRN

## 2015-06-22 MED ORDER — HALOPERIDOL 0.5 MG PO TABS: 0.5000 mg | ORAL_TABLET | ORAL | Status: DC | PRN

## 2015-06-22 MED ORDER — HALOPERIDOL LACTATE 2 MG/ML PO CONC
0.5000 mg | ORAL | Status: DC | PRN
  Filled 2015-06-22: qty 0.3

## 2015-06-22 MED ORDER — LORAZEPAM 2 MG/ML IJ SOLN
1.0000 mg | INTRAMUSCULAR | Status: DC | PRN
  Administered 2015-06-22: 1 mg via INTRAVENOUS
  Filled 2015-06-22: qty 1

## 2015-06-22 MED ORDER — MORPHINE BOLUS VIA INFUSION
1.0000 mg | INTRAVENOUS | Status: DC | PRN
  Administered 2015-06-22 (×3): 1 mg via INTRAVENOUS
  Filled 2015-06-22 (×4): qty 1

## 2015-06-22 MED ORDER — GLYCOPYRROLATE 0.2 MG/ML IJ SOLN: 0.4000 mg | Freq: Three times a day (TID) | INTRAMUSCULAR | Status: DC | PRN

## 2015-06-22 MED ORDER — PROCHLORPERAZINE 25 MG RE SUPP
25.0000 mg | Freq: Three times a day (TID) | RECTAL | Status: DC | PRN
  Filled 2015-06-22: qty 1

## 2015-06-22 NOTE — Progress Notes (Signed)
Bipap removed and patient placed on 2L nasal cannula. Wife and daughter at bedside along with other friends. Patient having increased work of breathing therefore bolus given via drip.

## 2015-06-22 NOTE — Progress Notes (Signed)
[facility] - [place] at [facility]   PATIENT NAME: Calvin Carpenter    MR#:  [id number]  DATE OF BIRTH:  08-Mar-1931  SUBJECTIVE:  Remains on BiPAP intensive care unit Does complain of shortness of breath when taken off BiPAP even on high flow   REVIEW OF SYSTEMS:  CONSTITUTIONAL: No fever, fatigue or weakness.  EYES: No blurred or double vision.  EARS, NOSE, AND THROAT: No tinnitus or ear pain.  RESPIRATORY: No cough, positive shortness of breath, wheezing denies hemoptysis.  CARDIOVASCULAR: No chest pain, orthopnea, edema.  GASTROINTESTINAL: No nausea, vomiting, diarrhea or abdominal pain.  GENITOURINARY: No dysuria, hematuria.  ENDOCRINE: No polyuria, nocturia,  HEMATOLOGY: No anemia, easy bruising or bleeding SKIN: No rash or lesion. MUSCULOSKELETAL: No joint pain or arthritis.   NEUROLOGIC: No tingling, numbness, weakness.  PSYCHIATRY: No anxiety or depression.   DRUG ALLERGIES:   Allergies  Allergen Reactions  . Benazepril Hcl Rash  . Fluvastatin Sodium Rash  . Mevacor [Lovastatin] Rash    VITALS:  Blood pressure 102/71, pulse 103, temperature 98 F (36.7 C), temperature source Oral, resp. rate 22, height  (1.702 m), weight 70 kg (154 lb 5.2 oz), SpO2 93 %.  PHYSICAL EXAMINATION:  VITAL SIGNS: Filed Vitals:   06/22/15 1300 06/22/15 1400  BP: 99/56 102/71  Pulse: 94 103  Temp:    Resp: 14 22   GENERAL:82 y.o.male currently critically ill  HEAD: Normocephalic, atraumatic.  EYES: Pupils equal, round, reactive to light. Extraocular muscles intact. No scleral icterus.  MOUTH: Moist mucosal membrane. Dentition intact. No abscess noted.  EAR, NOSE, THROAT: Clear without exudates. No external lesions.  NECK: Supple. No thyromegaly. No nodules. No JVD.  PULMONARY: Diminished breath sounds basilar rhonchi expiratory wheeze. No use of accessory muscles, poor respiratory effort. good air entry bilaterally on BiPAP CHEST: Nontender to  palpation.  CARDIOVASCULAR: S1 and S2. Regular rate and rhythm. No murmurs, rubs, or gallops. No edema. Pedal pulses 2+ bilaterally.  GASTROINTESTINAL: Soft, nontender, nondistended. No masses. Positive bowel sounds. No hepatosplenomegaly.  MUSCULOSKELETAL: No swelling, clubbing, or edema. Range of motion full in all extremities.  NEUROLOGIC: Cranial nerves II through XII are intact. No gross focal neurological deficits. Sensation intact. Reflexes intact.  SKIN: No ulceration, lesions, rashes, or cyanosis. Skin warm and dry. Turgor intact.  PSYCHIATRIC: Mood, affect within normal limits. The patient is awake, alert and oriented x 3. Insight, judgment intact.      LABORATORY PANEL:   CBC  Recent Labs Lab 06/22/15 0632  WBC 15.1*  HGB 10.6*  HCT 32.4*  PLT 353   ------------------------------------------------------------------------------------------------------------------  Chemistries   Recent Labs Lab 05/31/2015 0757  06/21/15 0444  NA 138  < > 136  K 4.1  < > 4.3  CL 102  < > 101  CO2 22  < > 22  GLUCOSE 201*  < > 185*  BUN 13  < > 39*  CREATININE 1.14  < > 1.14  CALCIUM 9.0  < > 9.4  MG  --   --  2.5*  AST 43*  --   --   ALT 18  --   --   ALKPHOS 45  --   --   BILITOT 0.5  --   --   < > = values in this interval not displayed. ------------------------------------------------------------------------------------------------------------------  Cardiac Enzymes  Recent Labs Lab 06/20/15 0117  TROPONINI 4.51*   ------------------------------------------------------------------------------------------------------------------  RADIOLOGY:  Dg Chest Port 1 View  06/21/2015  CLINICAL  DATA:  Acute respiratory failure. EXAM: PORTABLE CHEST 1 VIEW COMPARISON:  06/20/2015 FINDINGS: There is cardiomegaly with vascular congestion, interstitial prominence and bilateral lower lobe airspace opacities. Small layering effusions. No significant change since prior study. Right  PICC line is unchanged. IMPRESSION: Bilateral lower lobe opacities and interstitial prominence, likely reflecting mild edema. Small layering effusions. Electronically Signed   By: Charlett Nose M.D.   On: 06/21/2015 07:33    EKG:   Orders placed or performed during the hospital encounter of 05/29/2015  . EKG 12-Lead  . EKG 12-Lead    ASSESSMENT AND PLAN:   82 year old Caucasian Male history of systolic congestive heart failure presenting with shortness of breath admitted 05/28/2015  1. Acute on chronic systolic congestive heart failure: Lasix daily weight urine output, cardiology input appreciated, DuoNeb treatments 2. COPD exacerbation: Off steroids 3. Essential hypertension hold Norvasc given relative hypotension continue beta blocker and ACE inhibitor as blood pressure allows 4. Nstemi: Supportive    Disposition: Discussed with family overall poor prognosis given heart and lung conditions. Now DO NOT RESUSCITATE, family considering hospice case discussed with critical care their input appreciated  Continued conversation regarding hospice patient currently not eligible given treatments he is receiving discussed with family options regarding end-of-life care will discontinue BiPAP, continue with nasal cannula placed on comfort measures only with morphine for symptom relief-a subsequent take place for an entire family gathers which will be later today   All the records are reviewed and case discussed with Care Management/Social Workerr. Management plans discussed with the patient, family and they are in agreement.  CODE STATUS: dnr  TOTAL TIME TAKING CARE OF THIS PATIENT: 33 critical minutes.   POSSIBLE D/C IN 1-2 DAYS, DEPENDING ON CLINICAL CONDITION.   Calhoun Reichardt,  Mardi Mainland.D on 06/22/2015 at 2:09 PM  Between 7am to 6pm - Pager - [phone]  After 6pm: House Pager: - [phone]  [facility]  Office  [phone]  CC: Primary care physician; Mila Merry,  MD

## 2015-06-22 NOTE — Progress Notes (Signed)
Morphine drip increased to /H for respiratory distress.  Patient is sleeping. Family at bedside. Daughter speaking with Dr. Orvan Falconer at this time. Report given to Verlon Au, [place].

## 2015-06-22 NOTE — Consult Note (Signed)
Palliative Medicine Inpatient Consult Note   Name: Calvin Carpenter Date: 06/22/2015 MRN: [id number]  DOB: 1930-09-10  Referring Physician: Wyatt Haste, MD  Palliative Care consult requested for this 82 y.o. male for goals of medical therapy in patient with end-stage systolic congestive heart failure and COPD.    Brief History: Pt is an 82 yo man who has known ischemic cardiomyopathy who came in with dyspnea and who was found to have a non-ST elevation MI with acute on chronic CHF.  He also has Aortic Stenosis which contributed to his pulmonary edema.  He was placed on a heparin drip and BIPAP.  His BP was running low much of the time.  He ha ongoing dyspnea despite various oxygen settings.  A PICC line was placed on 2/25.  Peak troponin was 4.51.  Family had initially hoped he would improve and then go to [facility] for rehab.  However, pts echo showed an EF of 15% and severe aortic stenosis.  His shortness of breath was a constant complaint even on Hi Flow or BIPAP. Family was updated by Critical Care MD and pt's attending about how severely grim pt's prognosis is likely to be. They opted to choose comfort terminal care at this time.   TODAY'S DISCUSSIONS AND PLANS: Pts attending had spoken with family earlier and the consensus was for comfort care to be the goal.  Pt was then taken off of BIPAP and placed on nasal cannula oxygen.  I spoke with family and nurse.  Pt is having some increased wheezing per wife.  Nurse asked for some adjustments to orders and these were done.  I spoke with family about pt going to a larger room on 1C. They would very much like this --as there seem to be about 20 people in this small room.  Transfer orders placed after discussing with nursing.     IMPRESSION: Acute on chronic respiratory failure ---BIPAP and HI FLOW have been stopped and he is on nasal cannula oxygen ---He is getting a morphine infusions to prevent air hunger panic CAD  ---with NSTEMI this  admission ---with h/o prior MI and prior CABG  Aortic Stenosis --Severe Severe Ischemic Cardiomyopathy with EF 15% COPD with exacerbation Paroxysmal AFib Acute on Chronic Systolic CHF H/O C Diff colitis HTN PVD --- with h/o Carotid Artery Endarterectomy --- with h/o AAA ---with femoral bypass and bilateral other vasc surgery Dyslipidemia Former Smoker Hypotension (cardiogenic here)    REVIEW OF SYSTEMS:  Patient is not able to provide ROS due to being obtunded  SPIRITUAL SUPPORT SYSTEM: Yes.  SOCIAL HISTORY:  reports that he quit smoking about 4 years ago. His smoking use included Cigarettes. He has a 65 pack-year smoking history. He has never used smokeless tobacco. He reports that he does not drink alcohol or use illicit drugs.  LEGAL DOCUMENTS:  DNR form in paper chart  CODE STATUS: DNR  PAST MEDICAL HISTORY: Past Medical History  Diagnosis Date  . Myocardial infarct [facility]) 04-23-13    CAD on cardiac cath, no PCI or CABG  . History of chicken pox   . History of measles   . History of mumps   . CHF (congestive heart failure) (HCC)     systolic dysfunction, EF 40-45%  . C. difficile colitis 12/02/2014    following prolonged antibiotic treatment for post op pneumonia   . AAA (abdominal aortic aneurysm) (HCC)   . Carotid stenosis   . HTN (hypertension)   . Hyperlipidemia   .  Severe aortic stenosis   . COPD (chronic obstructive pulmonary disease) (HCC)   . Atrial fibrillation (HCC)     PAST SURGICAL HISTORY:  Past Surgical History  Procedure Laterality Date  . Groin surgery  2011  . Carotid endarterectomy Right 2007    Sankar  . Vascular surgery Bilateral     Aortic aneurysm stent, right femoral-left femoral bypass  . Hernia repair Left 10-29-12 and 04-23-13    inguinal  . Femoral bypass  09/15/2010    Fem-Fem Bypass; Dr. Earnestine Leys, [facility]. Bilateral with femoral endartectomies. Excised graft June 2012 dur to infection  . Abdominal aortic aneurysm repair  2009  .  Cataract extraction      Right eye 2001; Left eye 2000  . Prostate surgery  1993  . Kidney stone extraction  1980  . Cardiac catheterization  10/27/2005    Severe 2 vessel CAD with occluded prox RCA with collaterals. Lx occlusion. 95% OMI, 30% Mid LAD. Mild aortic stenosis  . Doppler echocardiography  09/08/2011    Mild global LV dysfunction EF=45%, MILD lvh, mild MI, TI. Severe AS    ALLERGIES:  is allergic to benazepril hcl; fluvastatin sodium; and mevacor.  MEDICATIONS:  Current Facility-Administered Medications  Medication Dose Route Frequency Provider Last Rate Last Dose  . acetaminophen (TYLENOL) suppository 650 mg  650 mg Rectal Q6H PRN Wyatt Haste, MD      . albuterol (PROVENTIL) (2.5 MG/3ML) 0.083% nebulizer solution 2.5 mg  2.5 mg Nebulization Q6H PRN Erin Fulling, MD      . glycopyrrolate (ROBINUL) injection 0.4 mg  0.4 mg Intravenous TID PRN Suan Halter, MD      . haloperidol lactate (HALDOL) injection 0.5 mg  0.5 mg Intravenous Q4H PRN Wyatt Haste, MD      . LORazepam (ATIVAN) injection 1 mg  1 mg Intravenous Q4H PRN Wyatt Haste, MD   1 mg at 06/22/15 1728  . morphine 250 mg in dextrose 5 % 250 mL (1 mg/mL) infusion  4 mg/hr Intravenous Continuous Suan Halter, MD 4 mL/hr at 06/22/15 1740 4 mg/hr at 06/22/15 1740  . ondansetron (ZOFRAN) injection 4 mg  4 mg Intravenous Q6H PRN Wyatt Haste, MD   4 mg at Jun 27, 2015 1330  . prochlorperazine (COMPAZINE) suppository 25 mg  25 mg Rectal Q8H PRN Suan Halter, MD        Vital Signs: BP 90/73 mmHg  Pulse 100  Temp(Src) 98 F (36.7 C) (Oral)  Resp 24  Ht  (1.702 m)  Wt 70 kg (154 lb 5.2 oz)  BMI 24.16 kg/m2  SpO2 90% Filed Weights   06/20/15 0411 06/21/15 0500 06/22/15 0500  Weight: 71.8 kg (158 lb 4.6 oz) 69.2 kg (152 lb 8.9 oz) 70 kg (154 lb 5.2 oz)    Estimated body mass index is 24.16 kg/(m^2) as calculated from the following:   Height as of this encounter:  (1.702 m).   Weight as  of this encounter: 70 kg (154 lb 5.2 oz).  PERFORMANCE STATUS (ECOG) : 4 - Bedbound  PHYSICAL EXAM: Pt is lying in step-down bed with his mouth wide open --breathing deep breaths about 11/ minute.  Numerous family members are present in the room.  Eyes are closed Nares patent with [place] oxygen in place OP is dry Neck w/o JVD or TM Hrt rrr no m Lungs with decreased BS bases Abd soft with rare BS Ext no mottling or cyanosis noted at this time.  LABS: CBC:    Component Value Date/Time   WBC 15.1* 06/22/2015 0632   WBC 16.4* 08/06/2014 0547   WBC 18.2 05/21/2013   HGB 10.6* 06/22/2015 0632   HGB 11.0* 08/06/2014 0547   HCT 32.4* 06/22/2015 0632   HCT 33.6* 08/06/2014 0547   HCT 41.8 04/16/2013   PLT 353 06/22/2015 0632   PLT 220 08/06/2014 0547   MCV 93.2 06/22/2015 0632   MCV 97 08/06/2014 0547   NEUTROABS 11.9* 07-10-15 0757   NEUTROABS 12.9* 08/06/2014 0547   NEUTROABS 6.3 05/15/2013 1420   LYMPHSABS 2.5 07-10-15 0757   LYMPHSABS 1.8 08/06/2014 0547   LYMPHSABS 2.0 05/15/2013 1420   MONOABS 1.5* 07-10-15 0757   MONOABS 1.6* 08/06/2014 0547   EOSABS 0.1 2015-07-10 0757   EOSABS 0.0 08/06/2014 0547   EOSABS 0.5* 05/15/2013 1420   BASOSABS 0.1 July 10, 2015 0757   BASOSABS 0.0 08/06/2014 0547   BASOSABS 0.0 05/15/2013 1420   Comprehensive Metabolic Panel:    Component Value Date/Time   NA 136 06/21/2015 0444   NA 142 01/29/2015 1018   NA 140 08/06/2014 0547   K 4.3 06/21/2015 0444   K 4.2 08/06/2014 0547   CL 101 06/21/2015 0444   CL 105 08/06/2014 0547   CO2 22 06/21/2015 0444   CO2 29 08/06/2014 0547   BUN 39* 06/21/2015 0444   BUN 13 01/29/2015 1018   BUN 20 08/06/2014 0547   CREATININE 1.14 06/21/2015 0444   CREATININE 0.75 08/06/2014 0547   CREATININE 1.0 11/01/2013   GLUCOSE 185* 06/21/2015 0444   GLUCOSE 113* 01/29/2015 1018   GLUCOSE 145* 08/06/2014 0547   CALCIUM 9.4 06/21/2015 0444   CALCIUM 8.6* 08/06/2014 0547   AST 43* 07/10/2015 0757    AST 24 06/08/2011 2100   ALT 18 2015/07/10 0757   ALT 23 06/08/2011 2100   ALKPHOS 45 07-10-15 0757   ALKPHOS 39* 06/08/2011 2100   BILITOT 0.5 07-10-2015 0757   BILITOT 0.2 06/08/2011 2100   PROT 7.3 07/10/15 0757   PROT 7.7 06/08/2011 2100   ALBUMIN 4.0 2015-07-10 0757   ALBUMIN 4.7 01/29/2015 1018   ALBUMIN 4.0 06/08/2011 2100   ECHO: - Left ventricle: The cavity size was severely dilated. The estimated ejection fraction was 15%. Diffuse hypokinesis. - Aortic valve: There was severe stenosis. Valve area (VTI): 0.97 cm^2. Valve area (Vmax): 1 cm^2. Valve area (Vmean): 0.98 cm^2. - Mitral valve: There was severe regurgitation. - Left atrium: The atrium was mildly dilated. - Right atrium: The atrium was mildly dilated. - Tricuspid valve: There was moderate regurgitation.   More than 50% of the visit was spent in counseling/coordination of care: Yes  Time Spent: 55 minutes

## 2015-06-22 NOTE — Care Management (Signed)
This CM was approached by Calvin Carpenter - who is "like a daughter to patient" and patient is "like a second father to her".   Calvin Carpenter is HCPOA should Calvin Carpenter not be able to act as HCPOA.  This document is present in Epic.  Family is wishing to pursue hospice through [facility].  Discussed with attending that patient is currently on bipapp/HFNC and must be weaned off bipap and or HFNC  before  can consider acceptance at hospice home and also must meet criteria as far as life expectancy.  Attending has placed palliative care consult.  Discussed speaking directly with palliative care and making this case a priority

## 2015-06-22 NOTE — Progress Notes (Signed)
dicsucced with multiple family members at bedside About case Decision - comfort measures Will give bolus morphine, start gtt, discontinue bipap Time spent 

## 2015-06-22 NOTE — Progress Notes (Signed)
Pt complained of "popping" coming from HFNC. After checking HFNC I could only find a little condensation in line so I replaced the the HFNC nasal prongs. Pt states that feels better and no more "popping"

## 2015-06-22 NOTE — Progress Notes (Signed)
°   06/22/15 1900  Clinical Encounter Type  Visited With Family  Visit Type Patient actively dying  Referral From Nurse  Consult/Referral To Chaplain  Pastoral care visit. Chaplain [employer] Ext [phone]

## 2015-06-22 NOTE — Progress Notes (Signed)
[facility] Encounter Note  Patient: Calvin Carpenter / Admit Date: 06-30-15 / Date of Encounter: 06/22/2015, 7:59 AM   Subjective: Patient still with some shortness of breath but continued to have some improvements today. Patient has severe LV dysfunction with aortic valve stenosis and coronary artery disease not amenable to further intervention at this time. Medical management is helping  Review of Systems: Positive for: Shortness of breath weakness and fatigue Negative for: Vision change, hearing change, syncope, dizziness, nausea, vomiting,diarrhea, bloody stool, stomach pain, cough, congestion, diaphoresis, urinary frequency, urinary pain,skin lesions, skin rashes Others previously listed  Objective: Telemetry: Sinus tachycardia with bundle branch block Physical Exam: Blood pressure 98/68, pulse 88, temperature 97.4 F (36.3 C), temperature source Oral, resp. rate 13, height  (1.702 m), weight 154 lb 5.2 oz (70 kg), SpO2 94 %. Body mass index is 24.16 kg/(m^2). General: Well developed, well nourished, in no acute distress. Head: Normocephalic, atraumatic, sclera non-icteric, no xanthomas, nares are without discharge. Neck: No apparent masses Lungs: Normal respirations with no wheezes, no rhonchi, no rales , basilar crackles   Heart: Regular rate and rhythm, normal S1 soft S2, 2+ aortic murmur, no rub, no gallop, PMI is normal size and placement, carotid upstroke normal with bruit, jugular venous pressure normal Abdomen: Soft, non-tender, non-distended with normoactive bowel sounds. No hepatosplenomegaly. Abdominal aorta is normal size without bruit Extremities: Trace edema, no clubbing, no cyanosis, no ulcers,  Peripheral: 2+ radial, 2+ femoral, 2+ dorsal pedal pulses Neuro: Alert and oriented. Moves all extremities spontaneously. Psych:  Responds to questions appropriately with a normal affect.   Intake/Output Summary (Last 24 hours) at 06/22/15  0759 Last data filed at 06/22/15 0700  Gross per 24 hour  Intake    444 ml  Output    850 ml  Net   -406 ml    Inpatient Medications:  . antiseptic oral rinse  7 mL Mouth Rinse q12n4p  . aspirin EC  81 mg Oral BH-q7a  . chlorhexidine  15 mL Mouth Rinse BID  . furosemide  40 mg Oral Daily  . heparin  3,500 Units Intravenous Once  . ipratropium-albuterol  3 mL Nebulization Q6H  . lisinopril  20 mg Oral Daily  . meloxicam  15 mg Oral Daily  . metoprolol succinate  25 mg Oral Daily  . simvastatin  40 mg Oral Daily  . sodium chloride flush  3 mL Intravenous Q12H  . verapamil  180 mg Oral QHS   Infusions:  . heparin 1,100 Units/hr (06/21/15 1123)    Labs:  Recent Labs  06/20/15 0422 06/21/15 0444  NA 134* 136  K 5.1 4.3  CL 99* 101  CO2 26 22  GLUCOSE 164* 185*  BUN 23* 39*  CREATININE 1.19 1.14  CALCIUM 9.2 9.4  MG  --  2.5*  PHOS  --  4.2   No results for input(s): AST, ALT, ALKPHOS, BILITOT, PROT, ALBUMIN in the last 72 hours.  Recent Labs  06/21/15 0444 06/22/15 0632  WBC 14.4* 15.1*  HGB 10.3* 10.6*  HCT 31.2* 32.4*  MCV 92.4 93.2  PLT 299 353    Recent Labs  2015-06-30 1213 06/30/2015 1824 06/20/15 0117  TROPONINI 1.94* 3.30* 4.51*   Invalid input(s): POCBNP No results for input(s): HGBA1C in the last 72 hours.   Weights: Filed Weights   06/20/15 0411 06/21/15 0500 06/22/15 0500  Weight: 158 lb 4.6 oz (71.8 kg) 152 lb 8.9 oz (69.2 kg) 154 lb 5.2 oz (  70 kg)     Radiology/Studies:  Dg Chest 2 View  05/29/2015  CLINICAL DATA:  Follow-up of pneumonia ; history of COPD and cardiomyopathy and previous tobacco use. EXAM: CHEST  2 VIEW COMPARISON:  CT scan of the chest and chest x-ray of May 23, 2015 FINDINGS: The lungs are mildly hyperinflated. A trace of pleural fluid persists on the left. The bibasilar infiltrates have resolved. The heart and pulmonary vascularity are normal. The mediastinum is normal in width. There is stable tortuosity of the  descending thoracic aorta. The bony thorax exhibits no acute abnormality. IMPRESSION: COPD. Interval clearing of the bibasilar infiltrates. A trace of pleural fluid persists on the left. Electronically Signed   By: David  [place] M.D.   On: 05/29/2015 11:33   Ct Angio Chest Pe W/cm &/or Wo Cm  05/23/2015  CLINICAL DATA:  Difficulty breathing. EXAM: CT ANGIOGRAPHY CHEST WITH CONTRAST TECHNIQUE: Multidetector CT imaging of the chest was performed using the standard protocol during bolus administration of intravenous contrast. Multiplanar CT image reconstructions and MIPs were obtained to evaluate the vascular anatomy. CONTRAST:  75mL OMNIPAQUE IOHEXOL 350 MG/ML SOLN COMPARISON:  04/25/2013. FINDINGS: Mediastinum / Lymph Nodes: There is no axillary lymphadenopathy. Scattered small lymph nodes in the mediastinum do not meet CT criteria for pathologic enlargement. No evidence for mediastinal lymphadenopathy. The heart is upper normal for size. No pericardial effusion. The esophagus has normal imaging features. No filling defect within the opacified pulmonary arteries to suggest the presence of an acute pulmonary embolus. No evidence for dissection flap in the thoracic aorta. Lungs / Pleura: Moderate changes of centrilobular emphysema noted bilaterally. Bronchial wall thickening is seen diffusely in both lungs. There is bilateral dependent collapse/ consolidation in the lower lobes. Small bilateral pleural effusions are noted. Upper Abdomen:  Unremarkable. MSK / Soft Tissues: Bone windows reveal no worrisome lytic or sclerotic osseous lesions. Review of the MIP images confirms the above findings. IMPRESSION: 1. No CT evidence for acute pulmonary embolus. 2. Bilateral lower lobe collapse/consolidation with small bilateral pleural effusions. 3. Moderate changes of centrilobular emphysema. Electronically Signed   By: Kennith Center M.D.   On: 05/23/2015 18:46   Dg Chest Port 1 View  06/21/2015  CLINICAL DATA:  Acute  respiratory failure. EXAM: PORTABLE CHEST 1 VIEW COMPARISON:  06/20/2015 FINDINGS: There is cardiomegaly with vascular congestion, interstitial prominence and bilateral lower lobe airspace opacities. Small layering effusions. No significant change since prior study. Right PICC line is unchanged. IMPRESSION: Bilateral lower lobe opacities and interstitial prominence, likely reflecting mild edema. Small layering effusions. Electronically Signed   By: Charlett Nose M.D.   On: 06/21/2015 07:33   Dg Chest Port 1 View  06/20/2015  CLINICAL DATA:  Evaluate line placement. EXAM: PORTABLE CHEST 1 VIEW COMPARISON:  Chest x-ray from earlier today FINDINGS: Increasing focal infiltrate in the medial right lung base is seen. Developing pneumonia is not excluded. Recommend clinical correlation. There may also be increasing opacity in left retrocardiac region. No overt edema. The cardiomediastinal silhouette is stable. The new right PICC line terminates in the central SVC. IMPRESSION: 1. The right PICC line is in good position. 2. Increasing focal opacity in the medial right lung base and possibly the left retrocardiac region. Recommend clinical correlation and follow-up to resolution. Electronically Signed   By: Gerome Sam III M.D   On: 06/20/2015 10:57   Dg Chest Port 1 View  06/20/2015  CLINICAL DATA:  Shortness of breath. EXAM: PORTABLE CHEST 1 VIEW COMPARISON:  06-26-2015 FINDINGS: No pneumothorax. Stable cardiomegaly. The hila and mediastinum are unchanged. Mild increased interstitial prominence consistent mild edema. More focal opacity in medial right lung base is more prominent in the interval. Recommend follow-up to resolution. IMPRESSION: Persistent mild edema. Opacity in the medial right lung base is more focal in the interval. Recommend attention on follow-up. Electronically Signed   By: Gerome Sam III M.D   On: 06/20/2015 09:34   Dg Chest Portable 1 View  06/26/2015  CLINICAL DATA:  Shortness  of breath with cough and chest pain EXAM: PORTABLE CHEST 1 VIEW COMPARISON:  May 29, 2015 FINDINGS: Underlying changes of COPD again noted. There is currently interstitial edema. Heart is mildly enlarged. There is mild pulmonary venous hypertension. No airspace consolidation. No adenopathy. There is degenerative change in each shoulder. IMPRESSION: Findings which peer consistent with congestive heart failure superimposed on COPD. No airspace consolidation. Electronically Signed   By: Bretta Bang III M.D.   On: 06/26/2015 08:07   Dg Chest Portable 1 View  05/23/2015  CLINICAL DATA:  Shortness of Breath EXAM: PORTABLE CHEST 1 VIEW COMPARISON:  04/22/2015 FINDINGS: Cardiac shadow is at the upper limits of normal in size. Aortic calcifications are noted. Bilateral basilar infiltrates are noted. No sizable effusion is seen. No bony abnormality is noted. IMPRESSION: Bibasilar infiltrates. Electronically Signed   By: Alcide Clever M.D.   On: 05/23/2015 10:59     Assessment and Recommendation  82 y.o. male with acute non-ST elevation myocardial infarction with acute on chronic systolic dysfunction multifactorial in nature including coronary artery disease diffuse in nature previously assessed by cardiac catheterization showing not to be amenable for further intervention as well as severe aortic valve stenosis not amenable for surgical intervention and severe LV systolic dysfunction by recent echocardiogram showing ejection fraction of 15% 1. Continue gentle diuresis with oral Lasix 2. Patient appears to have the possible poor output cardiomyopathy and congestive heart failure which may need continued introduction of low-dose beta blocker for heart rate control. Patient should have increase of beta blocker back to 25 mg   3. Continue aspirin for non-ST elevation myocardial infarction 4. No current ability for further aggressive intervention likely due to severe aortic stenosis and severe LV systolic  dysfunction with very high risk of complication and death with the possibility of this intervention. Patient has had this explained to him for which showed the patient does have a living will and understands prognosis 5. Begin ambulation if able and possible transfer to telemetry if able 6. Possible palliative care consultation as necessary  Signed, Arnoldo Hooker M.D. FACC

## 2015-06-22 NOTE — Progress Notes (Signed)
Morphine drip started at this time.  RN will wait for morphine drip to get in patient's system before removing bipap.

## 2015-06-22 NOTE — Progress Notes (Signed)
RN waiting for morphine drip to arrive from pharmacy.  RN discussed with Dr. Clint Guy the possible need to titrate morphine drip for dyspnea.  Dr. Clint Guy gave verbal order that RN can titrate morphine drip for dyspnea.

## 2015-06-22 NOTE — Progress Notes (Signed)
PULMONARY / CRITICAL CARE MEDICINE   Name: Calvin Carpenter MRN: [id number] DOB: [birth year]    ADMISSION DATE:  Jun 22, 2015   CONSULTATION DATE:  06/20/15  REFERRING MD:  Dr. Clint Guy  CHIEF COMPLAINT:  Acute respiratory failure  HISTORY OF PRESENT ILLNESS:   This is an 82 yo male with a PMH of systolic heart failure, CAD s/p MI/PCI/CABG, AAA, hypertension, AS, afib and COPD who presented with shortness of breath and shortness of breath with exertion x 2 days and intermittent chest pain. He was started on continuous BiPAP for severe respiratory distress and hypoxia. He was admitted to the ICU and CCM was consulted to assist with patient's management. Patient is now on High flow [place] and SPO2 is between 88-92%. Still hypotensive with systolic BP in the low 80s   SUBJECTIVE:  Remains on HFNC but appears more comfortable. Reports much some improvement in dyspnea with morphine. No other acute issues  VITAL SIGNS: BP 98/68 mmHg  Pulse 88  Temp(Src) 97.4 F (36.3 C) (Oral)  Resp 13  Ht  (1.702 m)  Wt 154 lb 5.2 oz (70 kg)  BMI 24.16 kg/m2  SpO2 92%  HEMODYNAMICS:    VENTILATOR SETTINGS: Vent Mode:  [-]  FiO2 (%):  [80 %-90 %] 86 %  INTAKE / OUTPUT: I/O last 3 completed shifts: In: 576 [P.O.:180; I.V.:396] Out: 1350 [Urine:1350]  PHYSICAL EXAMINATION: General:  NAD, cognition intact Neuro: no focal deficits HEENT: NCAT Cardiovascular: RRR, S1/S2, +murmur Lungs: Bilateral basilar crackles, no wheezes Abdomen: Soft, NT, +BS Ext: cool, trace symmetric edema  LABS:  BMET  Recent Labs Lab 22-Jun-2015 0757 06/20/15 0422 06/21/15 0444  NA 138 134* 136  K 4.1 5.1 4.3  CL 102 99* 101  CO2 BUN 13 23* 39*  CREATININE 1.14 1.19 1.14  GLUCOSE 201* 164* 185*    Electrolytes  Recent Labs Lab 06/22/2015 0757 06/20/15 0422 06/21/15 0444  CALCIUM 9.0 9.2 9.4  MG  --   --  2.5*  PHOS  --   --  4.2    CBC  Recent Labs Lab 06/20/15 0422 06/21/15 0444  06/22/15 0632  WBC 12.6* 14.4* 15.1*  HGB 10.4* 10.3* 10.6*  HCT 31.0* 31.2* 32.4*  PLT 276 299 353    Coag's  Recent Labs Lab Jun 22, 2015 1036  APTT 27  INR 1.15    Sepsis Markers  Recent Labs Lab 2015-06-22 0758 22-Jun-2015 1036 06/21/15 0444  LATICACIDVEN 6.6* 7.8*  --   PROCALCITON  --  <0.10 0.45    ABG  Recent Labs Lab 06/20/15 0759  PHART 7.42  PCO2ART 29*  PO2ART 58*    Liver Enzymes  Recent Labs Lab 06-22-15 0757  AST 43*  ALT 18  ALKPHOS 45  BILITOT 0.5  ALBUMIN 4.0    Cardiac Enzymes  Recent Labs Lab 22-Jun-2015 1213 06/22/15 1824 06/20/15 0117  TROPONINI 1.94* 3.30* 4.51*    Glucose No results for input(s): GLUCAP in the last 168 hours.  CXR: interstitial edema and small bilateral pleural effusions   ASSESSMENT / PLAN:  Assessment Acute hypoxic respiratory failure This is not COPD Doubt PNA Acute on chronic systolic heart failure Severe dilated CM Severe aortic stenosis NSTEMI Very poor prognosis  Plan - Continue supplemental O2 - Continue nebulized bronchodilators  - DC systemic steroids and antibiotics - DNR/DNI - Palliative care consult - Continue morphine for comfort  Family update/disposition: 02/27-Daughter at bedside. Patient and daughter notified about transfer out of ICU. All  questions answered; support provided.   - Transfer to floor if ok with primary team  PCCM will sign off. Please call if we can be of further assistance  [facility] ANP-BC Pulmonary and Critical Care Medicine [facility] Pager [phone]  06/22/2015, 9:10 AM  STAFF NOTE: I, Dr. Lucie Leather,  have personally reviewed patient's available data, including medical history, events of note, physical examination and test results as part of my evaluation. I have discussed with NP and other care providers such as pharmacist, RN and RRT.  In addition,  I personally evaluated patient and elicited key findings  S:  ON HIGH FLOW  [place]  O:  GEN-LETHARGIC CVS-NO MURMURS LUNGS-RHONCHI ABD-SIFT MSK-NO EDEMA   Recent Labs CBC Latest Ref Rng 06/22/2015 06/21/2015 06/20/2015  WBC 3.8 - 10.6 K/uL 15.1(H) 14.4(H) 12.6(H)  Hemoglobin 13.0 - 18.0 g/dL 10.6(L) 10.3(L) 10.4(L)  Hematocrit 40.0 - 52.0 % 32.4(L) 31.2(L) 31.0(L)  Platelets 150 - 440 K/uL 353 299 276      Recent Labs BMP Latest Ref Rng 06/21/2015 06/20/2015 07/15/15  Glucose 65 - 99 mg/dL 829(F) 621(H) 086(V)  BUN 6 - 20 mg/dL 78(I) 69(G) 13  Creatinine 0.61 - 1.24 mg/dL 2.95 2.84 1.32  Sodium 135 - 145 mmol/L 136 134(L) 138  Potassium 3.5 - 5.1 mmol/L 4.3 5.1 4.1  Chloride 101 - 111 mmol/L 101 99(L) 102  CO2 22 - 32 mmol/L Calcium 8.9 - 10.3 mg/dL 9.4 9.2 9.0       (The following images and results were reviewed by Dr. Belia Heman on 06/22/2015). Dg Chest Port 1 View  06/21/2015  CLINICAL DATA:  Acute respiratory failure. EXAM: PORTABLE CHEST 1 VIEW COMPARISON:  06/20/2015 FINDINGS: There is cardiomegaly with vascular congestion, interstitial prominence and bilateral lower lobe airspace opacities. Small layering effusions. No significant change since prior study. Right PICC line is unchanged. IMPRESSION: Bilateral lower lobe opacities and interstitial prominence, likely reflecting mild edema. Small layering effusions. Electronically Signed   By: Charlett Nose M.D.   On: 06/21/2015 07:33      A:Acute hypoxic respiratory failure Acute on chronic systolic heart failure PROGNOSIS IS VERY POOR  P:  RECOMMEND HOSPICE REFERRAL AND MORPHINE AS NEEDED    The Rest per NP whose note is outlined above and that I agree with  The Patient requires high complexity decision making for assessment and support, frequent evaluation and titration of therapies, application of advanced monitoring technologies and extensive interpretation of multiple databases.   Overall,prognosis is guarded AND VERY POOR.    Lucie Leather, M.D.  [facility]  Medical Director [facility] Medical Director [facility] Cardio-Pulmonary Department

## 2015-06-24 LAB — CULTURE, BLOOD (ROUTINE X 2)
Culture: NO GROWTH
Culture: NO GROWTH

## 2015-06-24 NOTE — Progress Notes (Signed)
Pt's family wishes to keep patient in room 106 until the older family members arrive around 0730. Supervisor notified, room temperature put on cool. All lines to be removed and patient washed before wife arrived per daughter's request. The Wife has arrived and is at bedside with other family members. Will continue to check on family.

## 2015-06-24 NOTE — Progress Notes (Signed)
Stopped by early this morning to offer prayers and consolation - multiple family members at bedside including wife and daughter

## 2015-06-24 NOTE — Progress Notes (Signed)
Pt expired at 0237. MD notified, family notified by daughter at bedside, Supervisor notified. [place] Donor notified, not a candidate for being a donor. Awaiting family arrival at this time.

## 2015-06-24 NOTE — Progress Notes (Signed)
°   06/13/2015 0300  Clinical Encounter Type  Visited With Family  Visit Type Death  Referral From Nurse  Consult/Referral To Chaplain  Pastoral care and support for family member upon patient death.  Family pastor present with family. Chaplain [employer] Ext [phone]

## 2015-06-24 DEATH — deceased

## 2015-06-25 ENCOUNTER — Inpatient Hospital Stay: Payer: PPO | Admitting: Pulmonary Disease

## 2015-06-29 ENCOUNTER — Ambulatory Visit: Payer: Self-pay | Admitting: Family Medicine

## 2015-06-29 ENCOUNTER — Telehealth: Payer: Self-pay | Admitting: Family Medicine

## 2015-06-29 NOTE — Telephone Encounter (Signed)
Please review. Thanks!  

## 2015-06-29 NOTE — Telephone Encounter (Signed)
Pt wife call to advised pt is deceased and cancel any appointment he had/MW

## 2015-07-02 ENCOUNTER — Ambulatory Visit: Payer: PPO | Admitting: *Deleted

## 2015-07-25 NOTE — Discharge Summary (Signed)
[facility] - [place] at [facility]   PATIENT NAME: Calvin Carpenter    MR#:  [id number]  DATE OF BIRTH:  10/13/1930  DATE OF ADMISSION:  06/16/2015 ADMITTING PHYSICIAN: Wyatt Haste, MD  DATE OF DISCHARGE: 2015-06-24  9:00 AM  PRIMARY CARE PHYSICIAN: Mila Merry, MD    ADMISSION DIAGNOSIS:  Acute respiratory failure with hypoxia (HCC) [J96.01] Acute on chronic congestive heart failure, unspecified congestive heart failure type (HCC) [I50.9]  DISCHARGE DIAGNOSIS:  Active Problems:   Acute respiratory failure with hypoxia (HCC)   Acute on chronic congestive heart failure (HCC)  NSTEMI  COPD exacerbation SECONDARY DIAGNOSIS:   Past Medical History  Diagnosis Date  . Myocardial infarct [facility]) 04-23-13    CAD on cardiac cath, no PCI or CABG  . History of chicken pox   . History of measles   . History of mumps   . CHF (congestive heart failure) (HCC)     systolic dysfunction, EF 40-45%  . C. difficile colitis 12/02/2014    following prolonged antibiotic treatment for post op pneumonia   . AAA (abdominal aortic aneurysm) (HCC)   . Carotid stenosis   . HTN (hypertension)   . Hyperlipidemia   . Severe aortic stenosis   . COPD (chronic obstructive pulmonary disease) (HCC)   . Atrial fibrillation [facility])     HOSPITAL COURSE:  [name]  is a 82 y.o. male admitted 06/16/2015 with chief complaint of shortness of breath. Please see H&P performed by myself for further information. On arrival to the hospital he required BiPAP for respiratory distress. His Place into the intensive care unit with goals of weaning his respiratory requirements. Started on IV diuresis as much his blood pressure tolerate. He had peak troponin of 4.51. He was evaluated by cardiology during his stay given acute on chronic congestive heart failure with NSTEMI. Echocardiogram revealed severe aortic stenosis with ejection fraction of 15%. His respiratory status never improved given his  overall condition is deemed an extremely poor candidate for further aggressive management. He is unable to be weaned off BiPAP therapy without desaturating. After lengthy discussions with patient and family decision was made to transition him over to comfort measures. He was taken off BiPAP, ordered to receive IV pain medication for symptom relief. Transitioned to 1c where I'll family at bedside. The patient died 2:37am

## 2015-11-03 ENCOUNTER — Ambulatory Visit: Payer: PPO | Admitting: Family
